# Patient Record
Sex: Female | Born: 1970 | Race: White | Hispanic: No | Marital: Married | Smoking: Never smoker
Health system: Southern US, Community
[De-identification: ages and names within clinical notes are randomized; demographics above are authoritative.]

## PROBLEM LIST (undated history)

## (undated) DIAGNOSIS — E079 Disorder of thyroid, unspecified: Secondary | ICD-10-CM

## (undated) DIAGNOSIS — E7800 Pure hypercholesterolemia, unspecified: Secondary | ICD-10-CM

## (undated) DIAGNOSIS — M797 Fibromyalgia: Secondary | ICD-10-CM

## (undated) DIAGNOSIS — E119 Type 2 diabetes mellitus without complications: Secondary | ICD-10-CM

## (undated) HISTORY — DX: Fibromyalgia: M79.7

## (undated) HISTORY — DX: Type 2 diabetes mellitus without complications: E11.9

## (undated) HISTORY — PX: OTHER SURGICAL HISTORY: SHX169

---

## 2000-05-20 ENCOUNTER — Emergency Department (HOSPITAL_COMMUNITY): Admission: EM | Admit: 2000-05-20 | Discharge: 2000-05-20 | Payer: Self-pay | Admitting: *Deleted

## 2006-10-19 ENCOUNTER — Emergency Department (HOSPITAL_COMMUNITY): Admission: EM | Admit: 2006-10-19 | Discharge: 2006-10-19 | Payer: Self-pay | Admitting: Emergency Medicine

## 2007-02-17 ENCOUNTER — Inpatient Hospital Stay (HOSPITAL_COMMUNITY): Admission: EM | Admit: 2007-02-17 | Discharge: 2007-02-19 | Payer: Self-pay | Admitting: Emergency Medicine

## 2007-11-10 ENCOUNTER — Inpatient Hospital Stay (HOSPITAL_COMMUNITY): Admission: EM | Admit: 2007-11-10 | Discharge: 2007-11-11 | Payer: Self-pay | Admitting: Emergency Medicine

## 2008-05-08 ENCOUNTER — Inpatient Hospital Stay (HOSPITAL_COMMUNITY): Admission: EM | Admit: 2008-05-08 | Discharge: 2008-05-12 | Payer: Self-pay | Admitting: Emergency Medicine

## 2008-06-02 ENCOUNTER — Inpatient Hospital Stay (HOSPITAL_COMMUNITY): Admission: EM | Admit: 2008-06-02 | Discharge: 2008-06-04 | Payer: Self-pay | Admitting: Emergency Medicine

## 2008-09-23 ENCOUNTER — Inpatient Hospital Stay (HOSPITAL_COMMUNITY): Admission: EM | Admit: 2008-09-23 | Discharge: 2008-09-25 | Payer: Self-pay | Admitting: Emergency Medicine

## 2008-10-16 ENCOUNTER — Inpatient Hospital Stay (HOSPITAL_COMMUNITY): Admission: EM | Admit: 2008-10-16 | Discharge: 2008-10-18 | Payer: Self-pay | Admitting: Internal Medicine

## 2008-10-16 ENCOUNTER — Encounter: Payer: Self-pay | Admitting: Emergency Medicine

## 2009-08-16 ENCOUNTER — Ambulatory Visit (HOSPITAL_COMMUNITY): Admission: RE | Admit: 2009-08-16 | Discharge: 2009-08-16 | Payer: Self-pay | Admitting: Preventative Medicine

## 2010-04-26 LAB — GLUCOSE, CAPILLARY
Glucose-Capillary: 119 mg/dL — ABNORMAL HIGH (ref 70–99)
Glucose-Capillary: 133 mg/dL — ABNORMAL HIGH (ref 70–99)
Glucose-Capillary: 146 mg/dL — ABNORMAL HIGH (ref 70–99)
Glucose-Capillary: 168 mg/dL — ABNORMAL HIGH (ref 70–99)
Glucose-Capillary: 214 mg/dL — ABNORMAL HIGH (ref 70–99)
Glucose-Capillary: 232 mg/dL — ABNORMAL HIGH (ref 70–99)
Glucose-Capillary: 234 mg/dL — ABNORMAL HIGH (ref 70–99)
Glucose-Capillary: 240 mg/dL — ABNORMAL HIGH (ref 70–99)
Glucose-Capillary: 241 mg/dL — ABNORMAL HIGH (ref 70–99)
Glucose-Capillary: 244 mg/dL — ABNORMAL HIGH (ref 70–99)
Glucose-Capillary: 248 mg/dL — ABNORMAL HIGH (ref 70–99)
Glucose-Capillary: 250 mg/dL — ABNORMAL HIGH (ref 70–99)
Glucose-Capillary: 283 mg/dL — ABNORMAL HIGH (ref 70–99)
Glucose-Capillary: 351 mg/dL — ABNORMAL HIGH (ref 70–99)
Glucose-Capillary: 389 mg/dL — ABNORMAL HIGH (ref 70–99)
Glucose-Capillary: 71 mg/dL (ref 70–99)
Glucose-Capillary: 76 mg/dL (ref 70–99)
Glucose-Capillary: 85 mg/dL (ref 70–99)
Glucose-Capillary: 380 mg/dL — ABNORMAL HIGH (ref 70–99)
Glucose-Capillary: 115 mg/dL — ABNORMAL HIGH (ref 70–99)
Glucose-Capillary: 119 mg/dL — ABNORMAL HIGH (ref 70–99)
Glucose-Capillary: 126 mg/dL — ABNORMAL HIGH (ref 70–99)
Glucose-Capillary: 131 mg/dL — ABNORMAL HIGH (ref 70–99)
Glucose-Capillary: 132 mg/dL — ABNORMAL HIGH (ref 70–99)
Glucose-Capillary: 133 mg/dL — ABNORMAL HIGH (ref 70–99)
Glucose-Capillary: 150 mg/dL — ABNORMAL HIGH (ref 70–99)
Glucose-Capillary: 173 mg/dL — ABNORMAL HIGH (ref 70–99)
Glucose-Capillary: 176 mg/dL — ABNORMAL HIGH (ref 70–99)
Glucose-Capillary: 180 mg/dL — ABNORMAL HIGH (ref 70–99)
Glucose-Capillary: 182 mg/dL — ABNORMAL HIGH (ref 70–99)
Glucose-Capillary: 193 mg/dL — ABNORMAL HIGH (ref 70–99)
Glucose-Capillary: 198 mg/dL — ABNORMAL HIGH (ref 70–99)
Glucose-Capillary: 223 mg/dL — ABNORMAL HIGH (ref 70–99)
Glucose-Capillary: 308 mg/dL — ABNORMAL HIGH (ref 70–99)
Glucose-Capillary: 439 mg/dL — ABNORMAL HIGH (ref 70–99)
Glucose-Capillary: 506 mg/dL (ref 70–99)
Glucose-Capillary: 55 mg/dL — ABNORMAL LOW (ref 70–99)
Glucose-Capillary: 77 mg/dL (ref 70–99)

## 2010-04-26 LAB — COMPREHENSIVE METABOLIC PANEL
AST: 20 U/L (ref 0–37)
Albumin: 3.1 g/dL — ABNORMAL LOW (ref 3.5–5.2)
Albumin: 2.8 g/dL — ABNORMAL LOW (ref 3.5–5.2)
Alkaline Phosphatase: 75 U/L (ref 39–117)
BUN: 15 mg/dL (ref 6–23)
BUN: 25 mg/dL — ABNORMAL HIGH (ref 6–23)
Calcium: 10.2 mg/dL (ref 8.4–10.5)
Calcium: 8.3 mg/dL — ABNORMAL LOW (ref 8.4–10.5)
Chloride: 115 mEq/L — ABNORMAL HIGH (ref 96–112)
Creatinine, Ser: 0.67 mg/dL (ref 0.4–1.2)
GFR calc Af Amer: >60 mL/min (ref >60)
Glucose, Bld: 512 mg/dL (ref 70–99)
Potassium: 4.9 mEq/L (ref 3.5–5.1)
Sodium: 138 mEq/L (ref 135–145)
Total Bilirubin: 0.9 mg/dL (ref 0.3–1.2)
Total Protein: 7.8 g/dL (ref 6.0–8.3)

## 2010-04-26 LAB — BASIC METABOLIC PANEL
BUN: 10 mg/dL (ref 6–23)
BUN: 11 mg/dL (ref 6–23)
BUN: 11 mg/dL (ref 6–23)
BUN: 3 mg/dL — ABNORMAL LOW (ref 6–23)
BUN: 8 mg/dL (ref 6–23)
BUN: 21 mg/dL (ref 6–23)
BUN: 20 mg/dL (ref 6–23)
BUN: 21 mg/dL (ref 6–23)
CO2: 15 mEq/L — ABNORMAL LOW (ref 19–32)
CO2: 24 mEq/L (ref 19–32)
CO2: 24 mEq/L (ref 19–32)
CO2: 18 mEq/L — ABNORMAL LOW (ref 19–32)
Calcium: 7.6 mg/dL — ABNORMAL LOW (ref 8.4–10.5)
Calcium: 7.8 mg/dL — ABNORMAL LOW (ref 8.4–10.5)
Calcium: 7.9 mg/dL — ABNORMAL LOW (ref 8.4–10.5)
Calcium: 8.1 mg/dL — ABNORMAL LOW (ref 8.4–10.5)
Calcium: 8.6 mg/dL (ref 8.4–10.5)
Calcium: 9.7 mg/dL (ref 8.4–10.5)
Calcium: 9.4 mg/dL (ref 8.4–10.5)
Chloride: 111 mEq/L (ref 96–112)
Chloride: 113 mEq/L — ABNORMAL HIGH (ref 96–112)
Chloride: 113 mEq/L — ABNORMAL HIGH (ref 96–112)
Chloride: 110 mEq/L (ref 96–112)
Chloride: 114 mEq/L — ABNORMAL HIGH (ref 96–112)
Chloride: 115 mEq/L — ABNORMAL HIGH (ref 96–112)
Creatinine, Ser: 0.74 mg/dL (ref 0.4–1.2)
Creatinine, Ser: 0.91 mg/dL (ref 0.4–1.2)
Creatinine, Ser: 0.92 mg/dL (ref 0.4–1.2)
Creatinine, Ser: 0.93 mg/dL (ref 0.4–1.2)
Creatinine, Ser: 1.4 mg/dL — ABNORMAL HIGH (ref 0.4–1.2)
Creatinine, Ser: 0.55 mg/dL (ref 0.4–1.2)
Creatinine, Ser: 1.2 mg/dL (ref 0.4–1.2)
GFR calc Af Amer: >60 mL/min (ref >60)
GFR calc Af Amer: >60 mL/min (ref >60)
GFR calc Af Amer: >60 mL/min (ref >60)
GFR calc Af Amer: 50 mL/min — ABNORMAL LOW (ref >60)
GFR calc Af Amer: >60 mL/min (ref >60)
GFR calc Af Amer: >60 mL/min (ref >60)
GFR calc Af Amer: >60 mL/min (ref >60)
GFR calc non Af Amer: >60 mL/min (ref >60)
GFR calc non Af Amer: >60 mL/min (ref >60)
GFR calc non Af Amer: >60 mL/min (ref >60)
GFR calc non Af Amer: >60 mL/min (ref >60)
GFR calc non Af Amer: 42 mL/min — ABNORMAL LOW (ref >60)
GFR calc non Af Amer: 50 mL/min — ABNORMAL LOW (ref >60)
GFR calc non Af Amer: >60 mL/min (ref >60)
GFR calc non Af Amer: >60 mL/min (ref >60)
Glucose, Bld: 144 mg/dL — ABNORMAL HIGH (ref 70–99)
Glucose, Bld: 150 mg/dL — ABNORMAL HIGH (ref 70–99)
Glucose, Bld: 160 mg/dL — ABNORMAL HIGH (ref 70–99)
Glucose, Bld: 252 mg/dL — ABNORMAL HIGH (ref 70–99)
Glucose, Bld: 76 mg/dL (ref 70–99)
Glucose, Bld: 165 mg/dL — ABNORMAL HIGH (ref 70–99)
Potassium: 3.4 mEq/L — ABNORMAL LOW (ref 3.5–5.1)
Potassium: 3.5 mEq/L (ref 3.5–5.1)
Potassium: 3.5 mEq/L (ref 3.5–5.1)
Potassium: 3.6 mEq/L (ref 3.5–5.1)
Potassium: 3.6 mEq/L (ref 3.5–5.1)
Potassium: 3.8 mEq/L (ref 3.5–5.1)
Potassium: 3.8 mEq/L (ref 3.5–5.1)
Potassium: 4.1 mEq/L (ref 3.5–5.1)
Sodium: 135 mEq/L (ref 135–145)
Sodium: 138 mEq/L (ref 135–145)
Sodium: 138 mEq/L (ref 135–145)
Sodium: 138 mEq/L (ref 135–145)
Sodium: 138 mEq/L (ref 135–145)
Sodium: 142 mEq/L (ref 135–145)

## 2010-04-26 LAB — BLOOD GAS, ARTERIAL
Bicarbonate: 10.5 mEq/L — ABNORMAL LOW (ref 20.0–24.0)
O2 Content: 21 L/min
O2 Saturation: 47.6 %
pCO2 arterial: 21.9 mmHg — ABNORMAL LOW (ref 35.0–45.0)
pH, Arterial: 7.109 — CL (ref 7.350–7.400)
pH, Arterial: 7.164 — CL (ref 7.350–7.400)
pO2, Arterial: 137 mmHg — ABNORMAL HIGH (ref 80.0–100.0)
pO2, Arterial: 32.5 mmHg — CL (ref 80.0–100.0)

## 2010-04-26 LAB — RAPID URINE DRUG SCREEN, HOSP PERFORMED
Benzodiazepines: NOT DETECTED
Cocaine: NOT DETECTED
Opiates: NOT DETECTED

## 2010-04-26 LAB — CBC
HCT: 38.1 % (ref 36.0–46.0)
HCT: 34.4 % — ABNORMAL LOW (ref 36.0–46.0)
HCT: 47 % — ABNORMAL HIGH (ref 36.0–46.0)
Hemoglobin: 12.6 g/dL (ref 12.0–15.0)
Hemoglobin: 15.4 g/dL — ABNORMAL HIGH (ref 12.0–15.0)
MCHC: 33.5 g/dL (ref 30.0–36.0)
MCHC: 32.9 g/dL (ref 30.0–36.0)
MCHC: 34.5 g/dL (ref 30.0–36.0)
MCV: 88.3 fL (ref 78.0–100.0)
MCV: 84.8 fL (ref 78.0–100.0)
MCV: 85.1 fL (ref 78.0–100.0)
Platelets: 222 10*3/uL (ref 150–400)
Platelets: 256 10*3/uL (ref 150–400)
Platelets: 313 10*3/uL (ref 150–400)
Platelets: 208 10*3/uL (ref 150–400)
Platelets: 258 10*3/uL (ref 150–400)
RBC: 4.36 MIL/uL (ref 3.87–5.11)
RBC: 5.17 MIL/uL — ABNORMAL HIGH (ref 3.87–5.11)
RBC: 4.04 MIL/uL (ref 3.87–5.11)
RDW: 15.1 % (ref 11.5–15.5)
RDW: 13.5 % (ref 11.5–15.5)
WBC: 14.4 10*3/uL — ABNORMAL HIGH (ref 4.0–10.5)
WBC: 13.8 10*3/uL — ABNORMAL HIGH (ref 4.0–10.5)
WBC: 9.9 10*3/uL (ref 4.0–10.5)

## 2010-04-26 LAB — DIFFERENTIAL
Basophils Absolute: 0.1 10*3/uL (ref 0.0–0.1)
Basophils Relative: 1 % (ref 0–1)
Eosinophils Absolute: 0 10*3/uL (ref 0.0–0.7)
Eosinophils Absolute: 0 10*3/uL (ref 0.0–0.7)
Eosinophils Relative: 0 % (ref 0–5)
Eosinophils Relative: 0 % (ref 0–5)
Eosinophils Relative: 0 % (ref 0–5)
Lymphocytes Relative: 7 % — ABNORMAL LOW (ref 12–46)
Lymphocytes Relative: 18 % (ref 12–46)
Lymphocytes Relative: 5 % — ABNORMAL LOW (ref 12–46)
Lymphocytes Relative: 8 % — ABNORMAL LOW (ref 12–46)
Lymphs Abs: 0.9 10*3/uL (ref 0.7–4.0)
Lymphs Abs: 1 10*3/uL (ref 0.7–4.0)
Lymphs Abs: 1.7 10*3/uL (ref 0.7–4.0)
Lymphs Abs: 1.8 10*3/uL (ref 0.7–4.0)
Monocytes Absolute: 0.7 10*3/uL (ref 0.1–1.0)
Monocytes Absolute: 1.2 10*3/uL — ABNORMAL HIGH (ref 0.1–1.0)
Monocytes Absolute: 1.3 10*3/uL — ABNORMAL HIGH (ref 0.1–1.0)
Monocytes Relative: 6 % (ref 3–12)
Monocytes Relative: 6 % (ref 3–12)
Neutro Abs: 12.6 10*3/uL — ABNORMAL HIGH (ref 1.7–7.7)
Neutro Abs: 12.2 10*3/uL — ABNORMAL HIGH (ref 1.7–7.7)
Neutro Abs: 18.6 10*3/uL — ABNORMAL HIGH (ref 1.7–7.7)
Neutrophils Relative %: 88 % — ABNORMAL HIGH (ref 43–77)
Neutrophils Relative %: 75 % (ref 43–77)

## 2010-04-26 LAB — TSH: TSH: 1.215 u[IU]/mL (ref 0.350–4.500)

## 2010-04-26 LAB — CULTURE, BLOOD (ROUTINE X 2)

## 2010-04-26 LAB — URINALYSIS, ROUTINE W REFLEX MICROSCOPIC
Bilirubin Urine: NEGATIVE
Glucose, UA: >1000 mg/dL — AB
Hgb urine dipstick: NEGATIVE
Ketones, ur: 40 mg/dL — AB
Ketones, ur: >80 mg/dL — AB
Leukocytes, UA: NEGATIVE
Nitrite: NEGATIVE
Nitrite: NEGATIVE
Protein, ur: NEGATIVE mg/dL
Protein, ur: NEGATIVE mg/dL
Specific Gravity, Urine: 1.025 (ref 1.005–1.030)
Specific Gravity, Urine: 1.025 (ref 1.005–1.030)
Urobilinogen, UA: 0.2 mg/dL (ref 0.0–1.0)
Urobilinogen, UA: 0.2 mg/dL (ref 0.0–1.0)
pH: 5.5 (ref 5.0–8.0)

## 2010-04-26 LAB — URINE MICROSCOPIC-ADD ON

## 2010-04-26 LAB — PHOSPHORUS: Phosphorus: 2.7 mg/dL (ref 2.3–4.6)

## 2010-04-26 LAB — MAGNESIUM
Magnesium: 2.1 mg/dL (ref 1.5–2.5)
Magnesium: 2.2 mg/dL (ref 1.5–2.5)

## 2010-04-26 LAB — URINE CULTURE
Colony Count: 35000
Special Requests: NEGATIVE

## 2010-04-26 LAB — PREGNANCY, URINE: Preg Test, Ur: NEGATIVE

## 2010-04-26 LAB — HEMOGLOBIN A1C: Hgb A1c MFr Bld: 11 % — ABNORMAL HIGH (ref 4.6–6.1)

## 2010-04-26 LAB — KETONES, QUALITATIVE: Acetone, Bld: 2 — AB

## 2010-04-30 LAB — BASIC METABOLIC PANEL
BUN: 1 mg/dL — ABNORMAL LOW (ref 6–23)
BUN: 1 mg/dL — ABNORMAL LOW (ref 6–23)
BUN: 11 mg/dL (ref 6–23)
BUN: 16 mg/dL (ref 6–23)
BUN: 2 mg/dL — ABNORMAL LOW (ref 6–23)
BUN: 3 mg/dL — ABNORMAL LOW (ref 6–23)
BUN: 5 mg/dL — ABNORMAL LOW (ref 6–23)
CO2: 21 mEq/L (ref 19–32)
CO2: 12 mEq/L — ABNORMAL LOW (ref 19–32)
CO2: 6 mEq/L — CL (ref 19–32)
Calcium: 6.7 mg/dL — ABNORMAL LOW (ref 8.4–10.5)
Calcium: 6.8 mg/dL — ABNORMAL LOW (ref 8.4–10.5)
Calcium: 7.5 mg/dL — ABNORMAL LOW (ref 8.4–10.5)
Chloride: 118 mEq/L — ABNORMAL HIGH (ref 96–112)
Chloride: 109 mEq/L (ref 96–112)
Chloride: 118 mEq/L — ABNORMAL HIGH (ref 96–112)
Chloride: 119 mEq/L — ABNORMAL HIGH (ref 96–112)
Chloride: 119 mEq/L — ABNORMAL HIGH (ref 96–112)
Chloride: 119 mEq/L — ABNORMAL HIGH (ref 96–112)
Creatinine, Ser: 0.5 mg/dL (ref 0.4–1.2)
Creatinine, Ser: 0.43 mg/dL (ref 0.4–1.2)
Creatinine, Ser: 0.49 mg/dL (ref 0.4–1.2)
Creatinine, Ser: 0.57 mg/dL (ref 0.4–1.2)
Creatinine, Ser: 1.01 mg/dL (ref 0.4–1.2)
Creatinine, Ser: 1.28 mg/dL — ABNORMAL HIGH (ref 0.4–1.2)
GFR calc Af Amer: >60 mL/min (ref >60)
GFR calc Af Amer: >60 mL/min (ref >60)
GFR calc Af Amer: >60 mL/min (ref >60)
GFR calc Af Amer: >60 mL/min (ref >60)
GFR calc non Af Amer: 46 mL/min — ABNORMAL LOW (ref >60)
GFR calc non Af Amer: >60 mL/min (ref >60)
GFR calc non Af Amer: >60 mL/min (ref >60)
GFR calc non Af Amer: >60 mL/min (ref >60)
GFR calc non Af Amer: >60 mL/min (ref >60)
GFR calc non Af Amer: >60 mL/min (ref >60)
GFR calc non Af Amer: >60 mL/min (ref >60)
Glucose, Bld: 95 mg/dL (ref 70–99)
Glucose, Bld: 114 mg/dL — ABNORMAL HIGH (ref 70–99)
Glucose, Bld: 114 mg/dL — ABNORMAL HIGH (ref 70–99)
Glucose, Bld: 219 mg/dL — ABNORMAL HIGH (ref 70–99)
Glucose, Bld: 250 mg/dL — ABNORMAL HIGH (ref 70–99)
Glucose, Bld: 442 mg/dL — ABNORMAL HIGH (ref 70–99)
Potassium: 3.7 mEq/L (ref 3.5–5.1)
Potassium: 3.1 mEq/L — ABNORMAL LOW (ref 3.5–5.1)
Potassium: 3.3 mEq/L — ABNORMAL LOW (ref 3.5–5.1)
Potassium: 3.6 mEq/L (ref 3.5–5.1)
Potassium: 3.8 mEq/L (ref 3.5–5.1)
Potassium: 4.7 mEq/L (ref 3.5–5.1)
Potassium: 4.9 mEq/L (ref 3.5–5.1)
Sodium: 136 mEq/L (ref 135–145)
Sodium: 137 mEq/L (ref 135–145)
Sodium: 137 mEq/L (ref 135–145)
Sodium: 138 mEq/L (ref 135–145)
Sodium: 141 mEq/L (ref 135–145)

## 2010-04-30 LAB — DIFFERENTIAL
Basophils Absolute: 0.1 10*3/uL (ref 0.0–0.1)
Basophils Relative: 1 % (ref 0–1)
Eosinophils Absolute: 0.1 10*3/uL (ref 0.0–0.7)
Eosinophils Absolute: 0 10*3/uL (ref 0.0–0.7)
Eosinophils Relative: 2 % (ref 0–5)
Eosinophils Relative: 0 % (ref 0–5)
Eosinophils Relative: 0 % (ref 0–5)
Lymphocytes Relative: 21 % (ref 12–46)
Lymphs Abs: 1.6 10*3/uL (ref 0.7–4.0)
Lymphs Abs: 2.4 10*3/uL (ref 0.7–4.0)
Monocytes Absolute: 1 10*3/uL (ref 0.1–1.0)
Neutrophils Relative %: 56 % (ref 43–77)
Neutrophils Relative %: 87 % — ABNORMAL HIGH (ref 43–77)

## 2010-04-30 LAB — GLUCOSE, CAPILLARY
Glucose-Capillary: 103 mg/dL — ABNORMAL HIGH (ref 70–99)
Glucose-Capillary: 117 mg/dL — ABNORMAL HIGH (ref 70–99)
Glucose-Capillary: 117 mg/dL — ABNORMAL HIGH (ref 70–99)
Glucose-Capillary: 47 mg/dL — ABNORMAL LOW (ref 70–99)
Glucose-Capillary: 50 mg/dL — ABNORMAL LOW (ref 70–99)
Glucose-Capillary: 103 mg/dL — ABNORMAL HIGH (ref 70–99)
Glucose-Capillary: 109 mg/dL — ABNORMAL HIGH (ref 70–99)
Glucose-Capillary: 111 mg/dL — ABNORMAL HIGH (ref 70–99)
Glucose-Capillary: 123 mg/dL — ABNORMAL HIGH (ref 70–99)
Glucose-Capillary: 126 mg/dL — ABNORMAL HIGH (ref 70–99)
Glucose-Capillary: 134 mg/dL — ABNORMAL HIGH (ref 70–99)
Glucose-Capillary: 137 mg/dL — ABNORMAL HIGH (ref 70–99)
Glucose-Capillary: 144 mg/dL — ABNORMAL HIGH (ref 70–99)
Glucose-Capillary: 154 mg/dL — ABNORMAL HIGH (ref 70–99)
Glucose-Capillary: 183 mg/dL — ABNORMAL HIGH (ref 70–99)
Glucose-Capillary: 186 mg/dL — ABNORMAL HIGH (ref 70–99)
Glucose-Capillary: 212 mg/dL — ABNORMAL HIGH (ref 70–99)
Glucose-Capillary: 218 mg/dL — ABNORMAL HIGH (ref 70–99)
Glucose-Capillary: 256 mg/dL — ABNORMAL HIGH (ref 70–99)
Glucose-Capillary: 312 mg/dL — ABNORMAL HIGH (ref 70–99)
Glucose-Capillary: 78 mg/dL (ref 70–99)

## 2010-04-30 LAB — BLOOD GAS, ARTERIAL
TCO2: 2.8 mmol/L (ref 0–100)
pCO2 arterial: 11 mmHg — CL (ref 35.0–45.0)
pH, Arterial: 7.038 — CL (ref 7.350–7.400)
pO2, Arterial: 137 mmHg — ABNORMAL HIGH (ref 80.0–100.0)

## 2010-04-30 LAB — CBC
HCT: 31.8 % — ABNORMAL LOW (ref 36.0–46.0)
HCT: 33.5 % — ABNORMAL LOW (ref 36.0–46.0)
HCT: 45.2 % (ref 36.0–46.0)
Hemoglobin: 11.8 g/dL — ABNORMAL LOW (ref 12.0–15.0)
MCHC: 34.9 g/dL (ref 30.0–36.0)
MCV: 87.1 fL (ref 78.0–100.0)
MCV: 89.5 fL (ref 78.0–100.0)
Platelets: 222 10*3/uL (ref 150–400)
Platelets: 366 10*3/uL (ref 150–400)
RDW: 14.8 % (ref 11.5–15.5)
RDW: 14.6 % (ref 11.5–15.5)
WBC: 4.8 10*3/uL (ref 4.0–10.5)
WBC: 11.6 10*3/uL — ABNORMAL HIGH (ref 4.0–10.5)

## 2010-04-30 LAB — PHOSPHORUS
Phosphorus: 1.7 mg/dL — ABNORMAL LOW (ref 2.3–4.6)
Phosphorus: 0.9 mg/dL — CL (ref 2.3–4.6)
Phosphorus: 1.2 mg/dL — ABNORMAL LOW (ref 2.3–4.6)
Phosphorus: 1.3 mg/dL — ABNORMAL LOW (ref 2.3–4.6)
Phosphorus: 1.4 mg/dL — ABNORMAL LOW (ref 2.3–4.6)
Phosphorus: <1 mg/dL — CL (ref 2.3–4.6)
Phosphorus: <1 mg/dL — CL (ref 2.3–4.6)

## 2010-04-30 LAB — URINALYSIS, ROUTINE W REFLEX MICROSCOPIC
Ketones, ur: >80 mg/dL — AB
Leukocytes, UA: NEGATIVE
Nitrite: NEGATIVE
Protein, ur: 100 mg/dL — AB
Urobilinogen, UA: 0.2 mg/dL (ref 0.0–1.0)

## 2010-04-30 LAB — HEPATIC FUNCTION PANEL
ALT: 13 U/L (ref 0–35)
AST: 23 U/L (ref 0–37)
Albumin: 3.6 g/dL (ref 3.5–5.2)
Alkaline Phosphatase: 91 U/L (ref 39–117)
Total Protein: 6.9 g/dL (ref 6.0–8.3)

## 2010-04-30 LAB — HEMOGLOBIN A1C: Hgb A1c MFr Bld: 10.8 % — ABNORMAL HIGH (ref 4.6–6.1)

## 2010-04-30 LAB — URINE MICROSCOPIC-ADD ON

## 2010-05-01 LAB — GLUCOSE, CAPILLARY
Glucose-Capillary: 103 mg/dL — ABNORMAL HIGH (ref 70–99)
Glucose-Capillary: 120 mg/dL — ABNORMAL HIGH (ref 70–99)
Glucose-Capillary: 121 mg/dL — ABNORMAL HIGH (ref 70–99)
Glucose-Capillary: 126 mg/dL — ABNORMAL HIGH (ref 70–99)
Glucose-Capillary: 128 mg/dL — ABNORMAL HIGH (ref 70–99)
Glucose-Capillary: 129 mg/dL — ABNORMAL HIGH (ref 70–99)
Glucose-Capillary: 134 mg/dL — ABNORMAL HIGH (ref 70–99)
Glucose-Capillary: 143 mg/dL — ABNORMAL HIGH (ref 70–99)
Glucose-Capillary: 147 mg/dL — ABNORMAL HIGH (ref 70–99)
Glucose-Capillary: 152 mg/dL — ABNORMAL HIGH (ref 70–99)
Glucose-Capillary: 166 mg/dL — ABNORMAL HIGH (ref 70–99)
Glucose-Capillary: 177 mg/dL — ABNORMAL HIGH (ref 70–99)
Glucose-Capillary: 180 mg/dL — ABNORMAL HIGH (ref 70–99)
Glucose-Capillary: 182 mg/dL — ABNORMAL HIGH (ref 70–99)
Glucose-Capillary: 187 mg/dL — ABNORMAL HIGH (ref 70–99)
Glucose-Capillary: 192 mg/dL — ABNORMAL HIGH (ref 70–99)
Glucose-Capillary: 192 mg/dL — ABNORMAL HIGH (ref 70–99)
Glucose-Capillary: 203 mg/dL — ABNORMAL HIGH (ref 70–99)
Glucose-Capillary: 210 mg/dL — ABNORMAL HIGH (ref 70–99)
Glucose-Capillary: 217 mg/dL — ABNORMAL HIGH (ref 70–99)
Glucose-Capillary: 227 mg/dL — ABNORMAL HIGH (ref 70–99)
Glucose-Capillary: 241 mg/dL — ABNORMAL HIGH (ref 70–99)
Glucose-Capillary: 278 mg/dL — ABNORMAL HIGH (ref 70–99)
Glucose-Capillary: 330 mg/dL — ABNORMAL HIGH (ref 70–99)
Glucose-Capillary: 99 mg/dL (ref 70–99)

## 2010-05-01 LAB — BASIC METABOLIC PANEL
BUN: 12 mg/dL (ref 6–23)
BUN: 5 mg/dL — ABNORMAL LOW (ref 6–23)
BUN: 7 mg/dL (ref 6–23)
BUN: 9 mg/dL (ref 6–23)
CO2: 11 mEq/L — ABNORMAL LOW (ref 19–32)
CO2: 19 mEq/L (ref 19–32)
CO2: 32 mEq/L (ref 19–32)
CO2: 8 mEq/L — CL (ref 19–32)
Calcium: 6.2 mg/dL — CL (ref 8.4–10.5)
Calcium: 6.4 mg/dL — CL (ref 8.4–10.5)
Calcium: 6.4 mg/dL — CL (ref 8.4–10.5)
Calcium: 6.6 mg/dL — ABNORMAL LOW (ref 8.4–10.5)
Calcium: 8.5 mg/dL (ref 8.4–10.5)
Chloride: 108 mEq/L (ref 96–112)
Chloride: 111 mEq/L (ref 96–112)
Chloride: 117 mEq/L — ABNORMAL HIGH (ref 96–112)
Chloride: 120 mEq/L — ABNORMAL HIGH (ref 96–112)
Creatinine, Ser: 0.44 mg/dL (ref 0.4–1.2)
Creatinine, Ser: 0.47 mg/dL (ref 0.4–1.2)
Creatinine, Ser: 0.51 mg/dL (ref 0.4–1.2)
Creatinine, Ser: 0.52 mg/dL (ref 0.4–1.2)
Creatinine, Ser: 0.72 mg/dL (ref 0.4–1.2)
Creatinine, Ser: 0.75 mg/dL (ref 0.4–1.2)
Creatinine, Ser: 1.3 mg/dL — ABNORMAL HIGH (ref 0.4–1.2)
GFR calc Af Amer: >60 mL/min (ref >60)
GFR calc Af Amer: >60 mL/min (ref >60)
GFR calc Af Amer: >60 mL/min (ref >60)
GFR calc Af Amer: >60 mL/min (ref >60)
GFR calc Af Amer: >60 mL/min (ref >60)
GFR calc non Af Amer: >60 mL/min (ref >60)
GFR calc non Af Amer: >60 mL/min (ref >60)
Glucose, Bld: 142 mg/dL — ABNORMAL HIGH (ref 70–99)
Glucose, Bld: 158 mg/dL — ABNORMAL HIGH (ref 70–99)
Glucose, Bld: 185 mg/dL — ABNORMAL HIGH (ref 70–99)
Glucose, Bld: 205 mg/dL — ABNORMAL HIGH (ref 70–99)
Glucose, Bld: 405 mg/dL — ABNORMAL HIGH (ref 70–99)
Potassium: 3.4 mEq/L — ABNORMAL LOW (ref 3.5–5.1)
Potassium: 4.1 mEq/L (ref 3.5–5.1)
Potassium: 4.7 mEq/L (ref 3.5–5.1)
Sodium: 143 mEq/L (ref 135–145)

## 2010-05-01 LAB — URINALYSIS, ROUTINE W REFLEX MICROSCOPIC
Bilirubin Urine: NEGATIVE
Glucose, UA: 250 mg/dL — AB
Ketones, ur: >80 mg/dL — AB
Leukocytes, UA: NEGATIVE
Nitrite: NEGATIVE
Specific Gravity, Urine: >1.03 — ABNORMAL HIGH (ref 1.005–1.030)
Urobilinogen, UA: 0.2 mg/dL (ref 0.0–1.0)

## 2010-05-01 LAB — CK TOTAL AND CKMB (NOT AT ARMC): Total CK: 104 U/L (ref 7–177)

## 2010-05-01 LAB — DIFFERENTIAL
Basophils Absolute: 0 10*3/uL (ref 0.0–0.1)
Basophils Absolute: 0 10*3/uL (ref 0.0–0.1)
Basophils Relative: 0 % (ref 0–1)
Basophils Relative: 0 % (ref 0–1)
Basophils Relative: 0 % (ref 0–1)
Basophils Relative: 1 % (ref 0–1)
Eosinophils Absolute: 0 10*3/uL (ref 0.0–0.7)
Eosinophils Absolute: 0.1 10*3/uL (ref 0.0–0.7)
Eosinophils Absolute: 0.1 10*3/uL (ref 0.0–0.7)
Eosinophils Relative: 0 % (ref 0–5)
Eosinophils Relative: 0 % (ref 0–5)
Eosinophils Relative: 1 % (ref 0–5)
Lymphocytes Relative: 5 % — ABNORMAL LOW (ref 12–46)
Lymphs Abs: 1.8 10*3/uL (ref 0.7–4.0)
Monocytes Absolute: 0.6 10*3/uL (ref 0.1–1.0)
Monocytes Absolute: 0.9 10*3/uL (ref 0.1–1.0)
Monocytes Relative: 5 % (ref 3–12)
Monocytes Relative: 8 % (ref 3–12)
Monocytes Relative: 9 % (ref 3–12)
Monocytes Relative: 9 % (ref 3–12)
Neutro Abs: 1.5 10*3/uL — ABNORMAL LOW (ref 1.7–7.7)
Neutro Abs: 17.1 10*3/uL — ABNORMAL HIGH (ref 1.7–7.7)
Neutro Abs: 2 10*3/uL (ref 1.7–7.7)
Neutrophils Relative %: 40 % — ABNORMAL LOW (ref 43–77)
Neutrophils Relative %: 46 % (ref 43–77)
Neutrophils Relative %: 71 % (ref 43–77)
Neutrophils Relative %: 89 % — ABNORMAL HIGH (ref 43–77)

## 2010-05-01 LAB — HEPATIC FUNCTION PANEL
AST: 15 U/L (ref 0–37)
Albumin: 2.6 g/dL — ABNORMAL LOW (ref 3.5–5.2)
Alkaline Phosphatase: 56 U/L (ref 39–117)
Total Protein: 4.6 g/dL — ABNORMAL LOW (ref 6.0–8.3)

## 2010-05-01 LAB — BLOOD GAS, ARTERIAL
Acid-base deficit: 20 mmol/L — ABNORMAL HIGH (ref 0.0–2.0)
Acid-base deficit: 27.7 mmol/L — ABNORMAL HIGH (ref 0.0–2.0)
Bicarbonate: 1.9 mEq/L — ABNORMAL LOW (ref 20.0–24.0)
Bicarbonate: 17.8 mEq/L — ABNORMAL LOW (ref 20.0–24.0)
FIO2: 21 %
O2 Content: 2 L/min
O2 Content: 2 L/min
O2 Saturation: 98.2 %
O2 Saturation: 99.2 %
TCO2: 16.2 mmol/L (ref 0–100)
TCO2: 5.4 mmol/L (ref 0–100)
pCO2 arterial: 12.3 mmHg — CL (ref 35.0–45.0)
pCO2 arterial: 35.8 mmHg (ref 35.0–45.0)
pCO2 arterial: 7.7 mmHg — CL (ref 35.0–45.0)
pH, Arterial: 7.424 — ABNORMAL HIGH (ref 7.350–7.400)
pH, Arterial: 7.445 — ABNORMAL HIGH (ref 7.350–7.400)
pO2, Arterial: 148 mmHg — ABNORMAL HIGH (ref 80.0–100.0)
pO2, Arterial: 150 mmHg — ABNORMAL HIGH (ref 80.0–100.0)

## 2010-05-01 LAB — CBC
HCT: 33.6 % — ABNORMAL LOW (ref 36.0–46.0)
HCT: 39.3 % (ref 36.0–46.0)
HCT: 39.5 % (ref 36.0–46.0)
HCT: 46.1 % — ABNORMAL HIGH (ref 36.0–46.0)
Hemoglobin: 11.5 g/dL — ABNORMAL LOW (ref 12.0–15.0)
Hemoglobin: 13.4 g/dL (ref 12.0–15.0)
MCHC: 34 g/dL (ref 30.0–36.0)
MCHC: 34.3 g/dL (ref 30.0–36.0)
MCHC: 34.7 g/dL (ref 30.0–36.0)
MCV: 86.3 fL (ref 78.0–100.0)
MCV: 87.2 fL (ref 78.0–100.0)
MCV: 88.8 fL (ref 78.0–100.0)
MCV: 89 fL (ref 78.0–100.0)
Platelets: 178 10*3/uL (ref 150–400)
Platelets: 307 10*3/uL (ref 150–400)
Platelets: 357 10*3/uL (ref 150–400)
RBC: 3.85 MIL/uL — ABNORMAL LOW (ref 3.87–5.11)
RBC: 4.17 MIL/uL (ref 3.87–5.11)
RBC: 4.32 MIL/uL (ref 3.87–5.11)
RBC: 4.43 MIL/uL (ref 3.87–5.11)
RDW: 14.2 % (ref 11.5–15.5)
RDW: 14.3 % (ref 11.5–15.5)
RDW: 14.6 % (ref 11.5–15.5)
WBC: 13.1 10*3/uL — ABNORMAL HIGH (ref 4.0–10.5)
WBC: 3.7 10*3/uL — ABNORMAL LOW (ref 4.0–10.5)
WBC: 4.3 10*3/uL (ref 4.0–10.5)

## 2010-05-01 LAB — URINE MICROSCOPIC-ADD ON

## 2010-05-01 LAB — CULTURE, BLOOD (ROUTINE X 2)
Culture: NO GROWTH
Culture: NO GROWTH
Report Status: 4252010

## 2010-05-01 LAB — TSH: TSH: 1.391 u[IU]/mL (ref 0.350–4.500)

## 2010-05-01 LAB — MAGNESIUM: Magnesium: 1.9 mg/dL (ref 1.5–2.5)

## 2010-05-01 LAB — KETONES, QUALITATIVE

## 2010-05-01 LAB — PHOSPHORUS: Phosphorus: 0.8 mg/dL — CL (ref 2.3–4.6)

## 2010-05-01 LAB — PROTIME-INR: INR: 1.2 (ref 0.00–1.49)

## 2010-06-04 NOTE — Discharge Summary (Signed)
[name], [name]             ACCOUNT NO.:  [id number]   MEDICAL RECORD NO.:  [id number]          PATIENT TYPE:  INP   LOCATION:  A304                          FACILITY:  [facility]   PHYSICIAN:  Skeet Latch, DO    DATE OF BIRTH:  1970/07/16   DATE OF ADMISSION:  05/08/2008  DATE OF DISCHARGE:  04/23/2010LH                               DISCHARGE SUMMARY   DISCHARGE DIAGNOSES:  1. Diabetic ketoacidosis.  2. History of insulin dependent diabetes.  3. History of hypothyroid.   BRIEF HOSPITAL COURSE:  This is a 40 year old Caucasian female who  presented to [facility] ER with the chief complaint of nausea and  vomiting.  Upon evaluations she was found to have a blood sugar of 400.  The patient has a history of juvenile diabetes and is on insulin pump.  Initial workup showed chest x-ray within normal limits.  Her ABG showed  a pH of 7.023, pCO2 7.7, pO2 148, bicarb 1.9, her acid base deficit was  27.7.  She was satting 96%.  Sodium was 137, potassium 4.7, chloride  108, CO2 5, glucose 405, BUN 12, creatinine 1.3, white count 13,000,  platelet count of 357,000.  The patient was admitted with diabetic  ketoacidosis to the ICU.  She was placed on DKA protocol and started on  glucommander.  The patient was given bicarb as well as potassium.  The  patient had daily ABGs done.  Ultimately the patient's glucommander was  stopped.  The patient was placed on a sliding scale.  Diabetic team was  consulted.  Her insulin pump was restarted.  The patient's blood sugars  are within normal range at this time.  At this time we feel the patient  can be discharged to home with followup with her endocrinologist.  Current vital signs:  Temperature is 97.7, pulse 90, respirations 16,  blood pressure 110/77, she is satting 98% on room air.   LABS:  White count is 3.7, hemoglobin 13, hematocrit 37.4, platelet  count 178,000, sodium 145, potassium 3.7, chloride 110, CO2 32, glucose  126, BUN 2, creatinine  0.52.   MEDICATIONS ON DISCHARGE:  The patient is to resume her home dose of her  Synthroid once a day.  The patient is to maintain her current settings  for her insulin pump.   CONDITION ON DISCHARGE:  Stable.   DISPOSITION:  The patient will be discharged to home.   DISCHARGE INSTRUCTIONS:  The patient is to maintain her diabetic diet.  She is to increase her activity slowly.  She is to follow up with Dr.  Merilynn [name], her endocrinologist, next week.  The patient is take all  medications as prescribed.  I  spoke with the patient at length.  I think that her DKA episodes seemed  to be stress induced.  This needs to be discussed with Dr. Merilynn [name] or her  primary care physician at this time.  The patient is to return to the  emergency room if she has any more abdominal pain, nausea, vomiting or  extremely elevated blood sugars.      Skeet Latch, DO  Electronically Signed     SM/MEDQ  D:  05/12/2008  T:  05/12/2008  Job:  [id number]

## 2010-06-04 NOTE — H&P (Signed)
NAMEHORACE, WISHON             ACCOUNT NO.:  [id number]   MEDICAL RECORD NO.:  [id number]          PATIENT TYPE:  INP   LOCATION:  A210                          FACILITY:  [facility]   PHYSICIAN:  Osvaldo Shipper, MD     DATE OF BIRTH:  June 28, 1970   DATE OF ADMISSION:  02/17/2007  DATE OF DISCHARGE:  LH                              HISTORY & PHYSICAL   The patient's primary care physician is located in [place].   ADMITTING DIAGNOSIS:  1. Diabetic ketoacidosis.  2. Possible viral gastroenteritis.  3. Hypothyroidism.   CHIEF COMPLAINT:  Nausea and vomiting since last night.   HISTORY OF PRESENT ILLNESS:  Patient is a 40 year old Caucasian female,  who has a past medical history of diabetes, on insulin pump, and  hypothyroidism, who was in her usual state of health until yesterday  evening, when she started having vomiting.  This was followed by  diarrhea.  She had numerous episodes of both.  Denied any fever, but did  have some chills.  She mentioned that her colleagues at her work place  have been having similar symptoms.  She denied any other sick contacts.  She denied any cough, any urinary disturbances.  She mentioned some  upper abdominal discomfort as a result of the vomiting, but not prior to  that.   MEDICATIONS AT HOME:  She is on a Humalog insulin pump, but she does not  remember the settings.  She is also on Synthroid 88 mcg once daily.   ALLERGIES:  No known drug allergies.   PAST MEDICAL HISTORY:  Diabetic since 1991.  Since then she has been on  insulin.  She has hypothyroidism.  Otherwise, she has had a C-section in  the past and cataract surgeries.   SOCIAL HISTORY:  Lives in [place].  Lives by herself most of the  time.  No smoking use, occasional alcohol use, no illicit drug use.  She  is currently employed.   FAMILY HISTORY:  Positive for coronary artery disease in her father.   REVIEW OF SYSTEMS:  GENERAL REVIEW OF SYSTEMS:  Positive for weakness,  malaise.  HEENT:  Unremarkable.  CARDIOVASCULAR:  Unremarkable.  RESPIRATORY:  Unremarkable.  GI:  As in HPI.  GU:  Unremarkable.  MUSCULOSKELETAL:  Unremarkable.  NEUROLOGIC:  Unremarkable.  PSYCHIATRIC:  Unremarkable.  OTHER SYSTEMS:  Unremarkable.   PHYSICAL EXAMINATION:  VITAL SIGNS:  When she was in the ED, her  temperature was 98.6, blood pressure 121/70, heart rate 126, respiratory  rate 18, saturation 100% on room air.  GENERAL EXAM:  This is a thin, white female, who appears to be a little  bit weak, but in no distress.  HEENT:  There is no pallor, no icterus.  Oral mucosa is dry.  No oral  lesions are noted.  NECK:  Soft and supple.  No thyromegaly is appreciated.  LUNGS:  Clear to auscultation bilaterally.  CARDIOVASCULAR:  S1, S2 are slightly tachycardic, regular, no murmurs  appreciated.  ABDOMEN:  Soft.  There is tenderness in the epigastric area, but no  rebound, density or guarding.  Bowel sounds  are present, normal.  No  mass or organomegaly is appreciated.  EXTREMITIES:  Show no edema.  Peripheral pulses are palpable.  NEUROLOGICALLY:  Patient is alert and oriented times three.  No focal  neurological deficits are present.   LABORATORY DATA:  Her white count is 24,900 with 90% neutrophils,  hemoglobin is 15.6 with MCV of 89, platelet count of 367.  CMET shows  bicarb of 7, glucose of 451, BUN is 19, creatinine is 1.38.  Her anion  gap is 26.  Her blood acetone was moderate.  Urinalysis did not show any  evidence for infection, did show mild proteinuria, large blood, but she  is having fevers at this time.   ASSESSMENT:  This is a 40 year old Caucasian female with insulin  dependent diabetes, who presents with nausea, vomiting, diarrhea and is  found to be dehydrated and has evidence for diabetic ketoacidosis.   PLAN:  1. Diabetic ketoacidosis:  Protocol for diabetic ketoacidosis will be      followed.  She will be given IV fluids, insulin drip, BMETs will be       checked.  I do not see, at this point, a need to check an ABG.  She      is clearly acidotic.  Possible contributing factor was      gastroenteritis.  2. Gastroenteritis:  Symptomatic treatment for now.  If she does not      improve, we will consider imaging studies.  3. Leukocytosis:  Likely as a result of dehydration, though infection      is also possible.  Hence, we will empirically start her on Levaquin      and we will follow her white count.  4. Mild epigastric discomfort:  We will check amylase, lipase and then      proceed from there.  Proton pump inhibitor will be used, as well.   Insulin pump has been discontinued, as she is on IV insulin.   I anticipate the patient's staying here about two to three days.      Osvaldo Shipper, MD  Electronically Signed     GK/MEDQ  D:  02/17/2007  T:  02/17/2007  Job:  [id number]

## 2010-06-04 NOTE — H&P (Signed)
[name], [name]             ACCOUNT NO.:  [id number]   MEDICAL RECORD NO.:  [id number]          PATIENT TYPE:  EMS   LOCATION:  ED                            FACILITY:  [facility]   PHYSICIAN:  Monte Fantasia, MD  DATE OF BIRTH:  [birth year]   DATE OF ADMISSION:  11/10/2007  DATE OF DISCHARGE:  LH                              HISTORY & PHYSICAL   HISTORY:  Patient is a 40 year old female, brought in with the chief  complaints of nausea and vomiting, two episodes since last night.  Patient known history of diabetes, type 1, since 86.  Has been on  insulin and monitors blood sugars at home.  On admission, the patient's  blood sugars were recorded to be high with sugars running around 400s.  She has complaints of high uncontrolled blood sugars since quite a few  months, around 200s to 300s.  Follows up with the primary care physician  every three to four months.  No complaints of chest pain.  No history of  any fevers, cough, cold or pain on passing urine.  The patient's vomitus  is clear.  No evidence of any blood in the vomitus.  No history of any  dizziness.  No history of shortness of breath or diarrhea or fever.   ALLERGIES:  The patient has no known drug allergies.   MEDICATIONS:  The patient is on Synthroid once a day and Humalog insulin  pump.   Last menstrual period was on October 27, 2007.   PAST MEDICAL HISTORY:  Significant for diabetes and hypothyroidism.   SOCIAL HISTORY:  Patient nonsmoker, nondrinker, no history of any IV  drug use, no history of recent travel or air travel.   IMMUNIZATION STATUS:  Not known.   PHYSICAL EXAM:  VITAL SIGNS:  Pulse of 115 on admission and then repeat  has been 96.  Respiratory rate of 18, blood pressure of 110/90.  HEENT EXAMINATION:  The neck is supple.  No evidence of any dehydration.  No evidence of pallor.  No evidence of any icterus.  Pupils equal,  reacting to light.  RESPIRATORY SYSTEM EXAMINATION:  Air entry is  bilaterally equal.  No  evidence of any rales or rhonchi.  CVS EXAMINATION:  Both heart sounds, S1, S2, heard.  No murmurs, no  radiation of any murmurs.  ABDOMEN:  Soft.  Bowel sounds are heard.  No guarding, no rigidity, no  organomegaly.  EXTREMITIES:  No edema of feet.  Pedal pulses well-felt.   ADMISSION LABORATORY DATA:  Sodium 134, potassium 4, chloride 106,  bicarb 10, glucose 393, BUN 12, creatinine 1.1, calcium of 9.2.  White  blood cell count 10.1, hemoglobin 16.1, hematocrit 48.2, platelet count  of 285 with left shift of 78.  Urinalysis:  Leukocyte esterase is  negative, ketones positive, urine hemoglobin is negative, bilirubin is  negative, ketones are more than 80 in urine.  Blood sugar chem strip is  413.   ASSESSMENT AND PLAN:  Patient admitted for diabetic ketoacidosis, high  anion gap and metabolic acidosis.  To keep patient n.p.o. The patient  was started on  insulin drip on admission in ED and the sugars were  titrated hourly. Intravenous fluids, give normal saline at 100 mL per  hour.  Monitor chem strips every 1 hour.  If blood sugars come less than  250, to give patient D5-1/2 normal saline at 100 mL per hour and monitor  BMP every 12, VBG every 12.  When anion gap is normal, patient to be  started on diabetic diet and then continued on the insulin pump.  Advise  diabetic education, DVT and GI prophylaxis.      Monte Fantasia, MD  Electronically Signed     MP/MEDQ  D:  11/10/2007  T:  11/10/2007  Job:  [id number]

## 2010-06-04 NOTE — H&P (Signed)
[name], [name]             ACCOUNT NO.:  [id number]   MEDICAL RECORD NO.:  [id number]          PATIENT TYPE:  INP   LOCATION:  IC10                          FACILITY:  [facility]   PHYSICIAN:  Osvaldo Shipper, MD     DATE OF BIRTH:  04/01/1970   DATE OF ADMISSION:  06/02/2008  DATE OF DISCHARGE:  LH                              HISTORY & PHYSICAL   ENDOCRINOLOGIST:  Dr. Terie Purser, [facility]   ADMITTING DIAGNOSIS:  1. Severe diabetic ketoacidosis.  2. History of insulin-dependent diabetes.  3. Hypothyroidism.  4. Depression.   CHIEF COMPLAINT:  Nausea and vomiting.   HISTORY OF PRESENT ILLNESS:  Patient is a 40 year old Caucasian female  who has a past medical history of insulin-dependent diabetes and was  actually last admitted back in April for DKA.  She tells me that, after  she was discharged, she went to see her endocrinologist, who repeated  her blood work and did not change any settings on her insulin pump and  everything was going great until this morning, when she woke up and  started having nausea and vomiting again.  Denies any abdominal pain, no  cough, no dysuria.  Denies any joint pains.  Did notice a skin rash at  her right ankle, which is non-itching and without any pain.  Denies  noncompliance.  Denies any other history that would give any indication  of precipitant factor. The last time she threw up was this morning and  is very keen on consuming some liquids.   MEDICATIONS AT HOME:  1. Synthroid 88 mcg daily.  2. Zoloft unknown dose daily.  3. She is on an insulin pump and basal rate changes depending on      different times of the day, but it ranges from 1.0 to 1.6 with the      total of 34.9 units per day and she boluses herself according to      the following:  1.4 units per 15 carbs.  Hemoglobin A1c that she      knows last checked was 11.1.  This was about four weeks ago.   ALLERGIES:  No known drug allergies.   PAST MEDICAL HISTORY:   Positive for insulin-dependent diabetes,  hypothyroidism, depression.  She had a C-section, cataract surgeries.  She is a diabetic since 1991.   SOCIAL HISTORY:  Lives in [place].  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  No smoking use.  Occasional alcohol use.  No illicit drug use.   FAMILY HISTORY:  Positive for coronary artery disease.   REVIEW OF SYSTEMS:  GENERAL REVIEW OF SYSTEMS:  Positive for weakness,  malaise.  HEENT:  Unremarkable.  CARDIOVASCULAR:  Unremarkable.  RESPIRATORY:  Unremarkable.  GI:  As in HPI.  GU:  Unremarkable.  MUSCULOSKELETAL:  Unremarkable.  NEUROLOGIC:  Unremarkable.  PSYCHIATRIC:  Unremarkable.  DERMATOLOGIC:  As in HPI.  OTHER SYSTEMS:  Unremarkable.   PHYSICAL EXAMINATION:  VITAL SIGNS:  She is afebrile, heart rate in the  90s, regular respiratory rate is about 16, blood pressure 97/50.  She is  chronically hypotensive.  Sats are 99% on 2 L.  GENERAL EXAM:  She is a thin, white female, in no distress.  HEENT:  There is no pallor, no icterus.  Oral mucosal membrane is very  dry.  No oral lesions are noted.  NECK:  Soft and supple.  No thyromegaly is appreciated, no  lymphadenopathy is noted.  LUNGS:  Clear to auscultation bilaterally.  No wheezing, rales or  rhonchi, no dullness to percussion.  CARDIOVASCULAR:  S1, S2 is normal, regular, no murmurs appreciated.  No  S3, S4, no rubs, no bruits.  Peripheral pulses are palpable.  No edema  is noted.  ABDOMEN:  Soft, nontender, nondistended.  Bowel sounds are present.  No  masses or organomegaly is appreciated.  GU:  Deferred.  MUSCULOSKELETAL:  Unremarkable.  SKIN:  She does have a macular rash, almost like petechiae, in the  medial aspect of her right ankle.  No other rashes are noted.  JOINTS:  Unremarkable.  NEUROLOGIC:  She is alert, oriented times three.  No focal neurological  deficits are present.   LABORATORY DATA:  Her ABG showed a pH of 7.038, pCO2 is 11, pO2 is 137,  bicarb is 2.8,  sats 97%.  White count is 16,500, hemoglobin 15.3,  platelet count 366.  Initial BMET showed a bicarb of 6 with anion gap of  23, glucose 442, BUN is 16, creatinine is 1.28.  UA does not show UTI.  No imaging studies have been done.   ASSESSMENT:  This is a 40 year old Caucasian female with a history of  insulin-dependent diabetes on insulin pump who presents with nausea and  vomiting and has evidence for diabetic ketoacidosis.  No precipitant  factor identified.  She has leukocytosis and polycythemia, which are  both result of volume-depletion.  She is also extremely dehydrated.   PLAN:  1. DKA protocol will be utilized.  She will be on insulin drip.  Her      insulin pump will be removed.  Intravenous fluids will be given,      which will be changed over to D5 when CBG goes below 250.      Electrolytes, including potassium and phosphorus levels, will be      followed up on.  Hemoglobin A1c will be repeated.  2. Dehydration.  Aggressive IV hydration will be provided.  3. Metabolic acidosis as a result of diabetic ketoacidosis.  At this      point, we will not utilize bicarb.  4. DVT and GI prophylaxis will be utilized.  5. Further management decisions will depend on results of further      testing and patient's response to treatment.      Osvaldo Shipper, MD  Electronically Signed    GK/MEDQ  D:  06/02/2008  T:  06/02/2008  Job:  [id number]   cc:   Dr. Roanna Raider, Endocrinologist, [place]

## 2010-06-04 NOTE — H&P (Signed)
[name], [name]             ACCOUNT NO.:  [id number]   MEDICAL RECORD NO.:  [id number]          PATIENT TYPE:  INP   LOCATION:  IC10                          FACILITY:  [facility]   PHYSICIAN:  Dorris Singh, DO    DATE OF BIRTH:  [birth year]   DATE OF ADMISSION:  05/08/2008  DATE OF DISCHARGE:  LH                              HISTORY & PHYSICAL   CHIEF COMPLAINT:  Nausea and vomiting.   HISTORY OF PRESENT ILLNESS:  The patient is a 40 year old Caucasian  female who presented to [facility] emergency room with a chief complaint  of nausea, vomiting.  When she was evaluated, she was found have a blood  sugar of 400.  Currently she has been a juvenile diabetic and has an  insulin pump.  She stated that she had a normal blood sugar yesterday  but she has been drinking a lot of water.  Today she started having  nausea and vomiting and dry mouth and fast breathing.  The patient  denies missing any meds or eating poorly.  States that nothing has made  it better; nothing has made it worse.  She has been admitted to our  service about a year ago for the same situation of being in DKA.   PAST MEDICAL HISTORY:  1. Insulin-dependent diabetes.  2. Hypothyroidism.   She has had an insulin pump since 1991.   SOCIAL HISTORY:  Nonsmoker, nondrinker.  No drug abuse and she is  currently working.   ALLERGIES:  No known drug allergies.   MEDICATIONS:  She is on to medications which include Synthroid which we  do not have a dose once a day and Humalog which is subcu which is for  her insulin pump.   REVIEW OF SYSTEMS:  CONSTITUTIONAL:  Positive for fatigue and appetite  changes.  EYES:  Negative for blurred vision.  EARS, NOSE, MOUTH, AND  THROAT:  Positive for extreme dry mouth.  CARDIOVASCULAR:  Negative for  chest pain, peripheral edema.  RESPIRATORY:  Negative for cough.  GASTROINTESTINAL:  Positive for nausea and vomiting.  Negative for  diarrhea, abdominal pain and bloody stools.  GU:  Negative for dysuria,  hematuria.  MUSCULOSKELETAL: Negative for back pain and neck pain.  SKIN:  Negative for pruritus or rash.  NEURO:  Negative for headache,  weakness or numbness.  PSYCHIATRIC:  Negative for depression.   PHYSICAL EXAMINATION:  VITAL SIGNS:  Blood pressure is 127/67, pulse  rate is 111, respirations 24.  Temperature 96.7, pulse oximetry 100%.  GENERAL:  The patient is a 40 year old Caucasian female who is well-  developed, well-nourished and she is uncomfortable-appearing and appears  to be in mild distress.  HEENT:  Normocephalic, atraumatic.  Eyes are normal.  EOMI.  No scleral  icterus or conjunctival injection.  Ears: Tympanic membranes are  visualized bilaterally.  Turbinates are dry and mucous membranes are  dry.  NECK:  Supple, full range of motion.  No lymphadenopathy noted.  HEART:  Regular rate and rhythm.  Positive tachycardia.  RESPIRATIONS:  Clear to auscultation bilaterally.  No rales, wheezes or  rhonchi.  She is tachypneic.  ABDOMEN:  Soft, nontender, nondistended.  No hepatosplenomegaly.  Normal bowel sounds.  EXTREMITIES:  Positive  pulses.  No edema, ecchymosis or cyanosis.  GU:  Positive Foley placement noted.  NEURO:  Cranial nerves II-XII grossly intact.  Good speech, good  coordination.  SKIN:  Cool and dry.  There are no abrasion source or rashes noted.   LABORATORY DATA:  Her chest x-ray was within normal limits.  Her ABG is  pH is 7.023, pCO2 7.7, pO2 148, bicarbonate 1.9.  Her acid base deficit  is 27.7, oxygen saturation is 97.6.  Her sodium is 137, potassium 4.7,  chloride 108, carbon dioxide 5, glucose 405, BUN 12, and creatinine 1.3.  She has moderate acetone in her blood and her capillary glucose was 426.  White count 13.1, hemoglobin 15.3, hematocrit 46.1 and platelet count of  357,000.   ASSESSMENT/PLAN:  1. Diabetic ketoacidosis.  2. Insulin dependent diabetes.   PLAN:  Plan will be to admit the patient to the service of  [facility] and  placed in the ICU.  Will start the DKA  protocol.  She has been started  on the [employer]  Will make sure we keep her potassium replaced and  due to her ion gap of being almost 30, and her bicarbonate level being  less than 2, will give her 2 ampules of bicarb while she is here in the  ED.  Will follow the diabetic ketoacidosis protocol as well as the  Glucomander protocol and once her blood sugars are within normal limits,  will switch her to the glycemic control orders.  Will get blood work in  the morning and will continue to monitor her.  Will do DVT and GI  prophylaxis.  Will hold off on her Synthroid, but will start it as soon  as she is no longer n.p.o. and once we get a dose.  Will get diabetes  instruction to come talk with her when she is stable and will help to  adjust her medications once she is stable enough to prevent any further  episodes.  Currently we will continue to monitor her and change therapy  as necessary.      Dorris Singh, DO  Electronically Signed     CB/MEDQ  D:  05/08/2008  T:  05/09/2008  Job:  [id number]

## 2010-06-04 NOTE — Discharge Summary (Signed)
[name], [name]             ACCOUNT NO.:  [id number]   MEDICAL RECORD NO.:  [id number]          PATIENT TYPE:  INP   LOCATION:  A210                          FACILITY:  [facility]   PHYSICIAN:  Osvaldo Shipper, MD     DATE OF BIRTH:  1970-11-22   DATE OF ADMISSION:  02/17/2007  DATE OF DISCHARGE:  01/30/2009LH                               DISCHARGE SUMMARY   The patient's primary medical doctor is located in [place].   Please review H and P, dictated at the time of admission, for details  regarding patient's presenting illness.   DISCHARGE DIAGNOSES:  1. Diabetic ketoacidosis, resolved.  2. 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  3. Hypothyroidism.  4. Mild renal insufficiency.   BRIEF HOSPITAL COURSE:  Briefly, this is a 40 year old Caucasian female,  who has insulin dependent diabetes, who uses an insulin pump, who  presented to the hospital with nausea, vomiting and diarrhea.  Patient  was noted to be dehydrated.  She also had leukocytosis of 24,000 and she  was found to be in diabetic ketoacidosis, as well.  Patient was started  on the DKA protocol with insulin drip.  Her acidosis slowly resolved.  She was also hypokalemic, which was also corrected.  Renal function also  improved rather quickly.  Her symptoms of gastroenteritis also resolved  and she has been tolerating p.o. intake as of yesterday.  Her blood  pressures have been running low.  Her blood pressures systolic have been  ranging between 75 and 90.  This morning, it was checked in front of me.  It was 100/68.  With these low pressures she has been completely  asymptomatic and has been ambulating in the hallways.  She is not on any  antihypertensive agents.   Also on admission, she was complaining of upper abdominal pain, which  was thought to be because of the gastritis, related to her symptoms.  We  checked an amylase and lipase, which came back at 1274 of amylase  and  lipase was actually 45.  Recheck of these showed that the amylase did  come down to 631 today and lipase continued to be normal.  It is felt  that this hyperamylasemia is likely secondary to her nausea and  vomiting, rather than a pancreatic process.  An ultrasound of the  abdomen was done, which was actually a technically good study, which did  not reveal any evidence for pancreatitis.  No other pancreatic  abnormalities were noted.  So I think she will need followup blood work  for amylase and if amylase continues to be elevated, she will need  further evaluation, maybe a pelvic ultrasound to rule out ovarian  tumors, and maybe even a GI workup.   The patient today is feeling much better.  She is very keen on going  home, does not have any symptoms.  Her pain in the abdomen has resolved.  Apart from her blood pressure that was discussed earlier, her other  vital signs are all stable.  Her blood work today shows CBC is  completely  normal.  Her BMET shows, this morning, a glucose of 203,  bicarb of 22, creatinine 1.9.   I think, considering her overall clinical situation, she is stable for  discharge.   DISCHARGE MEDICATIONS:  1. Insulin pump as before.  2. Synthroid 88 mcg p.o. daily.   No new medications have been added.  She did get about three days of  Levaquin, which we need not continue at this time.   FOLLOWUP BLOOD WORK:  She will need a BMET and amylase and lipase on  February 2, which will need to be followed up by her PMD, and then  further evaluation as discussed above.   FOLLOWUP:  I have told the patient to call the PMD's office today so  that she can be made an appointment to be seen early next week. Further  followup with GI and a pelvic ultrasound maybe needed depending on blood  work drawn next week, as well.   Total time spent on discharge:  About 40 minutes.      Osvaldo Shipper, MD  Electronically Signed     GK/MEDQ  D:  02/19/2007  T:   02/19/2007  Job:  [id number]

## 2010-06-04 NOTE — Discharge Summary (Signed)
NAMETRULY, STANKIEWICZ             ACCOUNT NO.:  [id number]   MEDICAL RECORD NO.:  [id number]          PATIENT TYPE:  INP   LOCATION:  A334                          FACILITY:  [facility]   PHYSICIAN:  Monte Fantasia, MD  DATE OF BIRTH:  1970/08/08   DATE OF ADMISSION:  11/10/2007  DATE OF DISCHARGE:  10/22/2009LH                               DISCHARGE SUMMARY   HISTORY OF PRESENT ILLNESS:  A 40 year old lady patient was admitted  yesterday with DKA with associated nausea and vomiting, two episodes.  The patient was started on insulin drip and given IV fluid hydration.  The patient's BMP and VBG were monitored q.4 h.  The bicarb improved  gradually from 8 to 19.  At present, the patient is asymptomatic and  feeling much better.  Blood sugars are well controlled.  At present, the  patient has been switched on to insulin pump.  We will discontinue the  insulin drip within an hour, and the patient is on full oral diet.   PHYSICAL EXAMINATION:  VITAL SIGNS:  Temperature is 98, pulse is 80,  respirations 18, blood pressure is 83/46, and oxygen saturations is 100%  on room air.  HEENT:  No pallor.  Oral mucosa moist.  NECK:  Supple.  No lymphadenopathy.  RESPIRATORY SYSTEM:  Air entry is bilaterally equal.  No rales.  No  rhonchi.  CVS:  S1 and S2.  Regular rate and rhythm.  No murmurs.  ABDOMEN:  Soft.  No organomegaly.  No distention.  No tenderness.  No  masses are palpable.  EXTREMITIES:  No edema of feet.   LABORATORY DATA:  Total WBC count of 10.1, H&H 16.1/48, platelets of 85,  and neutrophils of 78.  Glucose sticks is 87 and 218.  Sodium 139,  potassium 3.6, chloride is 117, bicarb is 19, glucose is 83, BUN is 7,  and creatinine is 0.6.  The patient's UA is nitrite negative, leukocytes  negative, and urine pregnancy test is negative.   ASSESSMENT AND PLAN:  Diabetes ketoacidosis, resolved, type 1 diabetes,  and hypothyroidism.  The patient's insulin drip will be discontinued  within an hour.  Accu-Checks monitored q.1 h.  Initially, the patient  was started on insulin pump with her regular schedule.  We will monitor  her chem-strips and also diabetes teaching provided.  The patient  advised to go to see her primary care physician within a week, will  continue rest of her medications as per her home medications.  If the  patient's chem-strips are well controlled, will be planned to discharge  today.  Deep venous thrombosis and gastrointestinal prophylaxis given.      Monte Fantasia, MD  Electronically Signed     MP/MEDQ  D:  11/11/2007  T:  11/12/2007  Job:  [id number]

## 2010-06-04 NOTE — Discharge Summary (Signed)
[name], [name]             ACCOUNT NO.:  [id number]   MEDICAL RECORD NO.:  [id number]          PATIENT TYPE:  INP   LOCATION:  IC10                          FACILITY:  [facility]   PHYSICIAN:  Osvaldo Shipper, MD     DATE OF BIRTH:  1970/11/11   DATE OF ADMISSION:  06/02/2008  DATE OF DISCHARGE:  LH                               DISCHARGE SUMMARY   Please review History and Physical dictated at the time of admission for  details regarding the patient's presenting illness.   ENDOCRINOLOGIST:  Ocie Cornfield, MD   FINAL DIAGNOSES:  1. Severe diabetic ketoacidoses, resolved.  2. Poorly controlled insulin dependent diabetes mellitus on insulin      pump.  3. HbA1c of 10.8.  4. History of hypothyroidism.  5. Depression.   BRIEF HOSPITAL COURSE:  This is a 40 year old Caucasian female who has a  past medical history of insulin dependent diabetes mellitus on an  insulin pump, hypothyroidism and depression who presented to the  hospital with nausea, vomiting and was found to have severe DKA.  Her  arterial gas showed pH of 7.03.  She had an elevated anion gap, no  precipitating factor for DKA was identified.  The patient's insulin pump  was taken off and she was put on an insulin drip.  She did not report  noncompliance.  She did not run out of insulin.  With the typical DKA  management, her blood sugars improved, her anion gap corrected.  She has  been tolerating p.o. intake for the past day and a half. She was  switched back to her insulin pump yesterday evening.  She experienced 2  episodes of hypoglycemia, one at midnight at the level of 46 and one at  5:30 this morning, 47.  Subsequent blood sugars have been okay.  She  denies any symptoms associated with this hypoglycemic event.  She denies  any change in her basal rate.   The rest of her medical issues are stable.  She has chronic hypotension  without any symptoms.  She was also severely dehydrated which also  corrected with IV  hydration.  Her phosphorus levels have been running  low for which she has been getting Neutra-Phos.   The plan is that if this patient's blood sugars stay okay and she  tolerates her lunch, she should be able to go home.   On the day of discharge, the patient is feeling great.  She is very keen  on getting discharged.  Denies any complaints whatsoever.  Vital signs,  except for the low blood pressure, are all stable.  Examination is  otherwise unremarkable. Abdomen is soft, lungs are clear to  auscultation.  Cardiovascular, S1 and S2 normal and regular.   Her labs this morning are completely unremarkable.  She is mildly anemic  and her phosphorus level has improved but still slightly low at 1.7.   DISCHARGE MEDICATIONS:  1. Insulin pump at her current settings.  Basal rate changes depending      on the time of day and are as follows:  Midnight to 3 a.m., 1.5  units/hr; from 3 a.m. to 7:30 a.m., 1 unit/hr; from 7:30 to 11      a.m., 1.6 units/hr; from 11 a.m. to 3 p.m., 1.6 units/heart rate;      from 3 p.m. to 8 p.m., 1.5 unit/hr; and from 8 p.m. to midnight,      1.6 units/hr.  She boluses herself 1 unit for 15 grams of      carbohydrates.  Her target CBG is 130.  2. Synthroid 88 mcg daily.  3. Zoloft unknown dose daily.   DIET:  Carb modified.  She is asked to take a midnight snack as well.  Ideally she should be eating small quantities of meals multiple times a  day.   ACTIVITIES:  No restrictions.   FOLLOW UP:  Follow-up with her endocrinologist in one week.   CONSULTANTS:  None.   PROCEDURES:  None.   IMAGING STUDIES:  None.   Total time on this discharge encounter 35 minutes.   ADDENDUM: She experienced hypoglycemia which prompted lowering of the  basal rate. She was monitored for few more hours and remained stable and  so was discharged.      Osvaldo Shipper, MD  Electronically Signed     GK/MEDQ  D:  06/04/2008  T:  06/04/2008  Job:  [id number]   cc:    Ocie Cornfield  Fax: [phone]

## 2010-06-04 NOTE — Group Therapy Note (Signed)
[name], [name]             ACCOUNT NO.:  [id number]   MEDICAL RECORD NO.:  [id number]          PATIENT TYPE:  INP   LOCATION:  IC10                          FACILITY:  [facility]   PHYSICIAN:  Skeet Latch, DO    DATE OF BIRTH:  Apr 27, 1970   DATE OF PROCEDURE:  05/10/2008  DATE OF DISCHARGE:                                 PROGRESS NOTE   SUBJECTIVE:  Patient seen doing appropriate.  No nausea, vomiting,  abdominal pain at this time.  Patient denies any fever, chills,  lightheadedness, dizziness, chest pain, or abdominal pain.  Patient  continues to improve.   OBJECTIVE:  VITAL SIGNS:  Temperature 98, heart rate 104, respiratory  rate 18, blood pressure 96/__________.  GENERAL:  She is awake and alert.  In no acute distress.  CARDIOVASCULAR:  S1 and S2 are normal, regular.  No murmurs, rubs or  gallops.  LUNGS:  Clear to auscultation bilaterally.  No rales, rhonchi or  wheezes.  ABDOMEN:  Soft, nontender, nondistended.  Positive bowel sounds.  No  organomegaly is appreciated.  EXTREMITIES:  No clubbing, cyanosis or edema.  NEUROLOGIC:  She is alert and oriented x3.   LABS:  Sodium 139, potassium 2.8, chloride 117, CO2 19, glucose 152, BUN  2, creatinine 0.44.  White count 8.4, hemoglobin 11.5, hematocrit 33.6,  platelet count 215,000.  ABG on room air reveals pH of 7.424, pCO2 27.7,  pO2 87.7, bicarb 17.8.   ASSESSMENT/PLAN:  1. Diabetic ketoacidosis:  Continues to improve.  Patient's insulin      drip has been discontinued.  Patient will be placed on a sliding      scale at this time.  Will try to get home insuliln dose to restart      patient's home pump.  Patient's hemoglobin A1C was 11.  Will      continue to monitor the patient's Accu-Cheks every 3 to 4 hours and      adjust her insulin as needed.  2. Hypotension:  Patient has chronic hypotension.  Her systolics are      in the 90's.  Will continue with fluids and continue to monitor her      closely.  3.  Hypokalemia:  Will continue to correct her potassium orally and      rehydrate if needed.  4. Deep venous thrombosis and gastrointestinal prophylaxis.  5. Abdominal tenderness:  None is appreciated today.  Continue to      monitor.  At this time, we will transfer patient to a general      medical bed and continue to monitor her closely.      Skeet Latch, DO  Electronically Signed     SM/MEDQ  D:  05/10/2008  T:  05/10/2008  Job:  [id number]

## 2010-06-04 NOTE — Group Therapy Note (Signed)
[name], [name]             ACCOUNT NO.:  [id number]   MEDICAL RECORD NO.:  [id number]          PATIENT TYPE:  INP   LOCATION:  IC10                          FACILITY:  [facility]   PHYSICIAN:  Osvaldo Shipper, MD     DATE OF BIRTH:  06-29-1970   DATE OF PROCEDURE:  05/09/2008  DATE OF DISCHARGE:                                 PROGRESS NOTE   SUBJECTIVE:  Patient is feeling very thirsty.  She denies anymore  episodes of nausea/vomiting since being in the ICU.  Denies any  dizziness or lightheadedness.  No chest pains.  No abdominal pain.  Patient reports periods that are ongoing at this time.   OBJECTIVE:  Ins and outs, she made about 1700 mL of urine yesterday  positive by 1800 mL.  VITAL SIGNS:  Show temperature 97.5.  Heart rate 96.  Blood pressure  90/52.  Respiratory rate of 16.  Saturation 100% on room air.  She is on  an insulin drip at one point 4 units per hour.  GENERAL EXAM:  This is a well-developed, well-nourished, white female,  lethargic, easily arousable, in no distress.  HEENT:  There is no pallor, no icterus.  Oral mucous membranes are  moist.  No oral lesions are noted.  NECK:  Soft and supple.  No thyromegaly is appreciated.  LUNGS:  Clear to auscultation anteriorly bilaterally.  CARDIOVASCULAR:  S1 and S2, normal, regular.  No murmurs appreciated.  No S3 or S4.  No rubs.  No bruits.  ABDOMEN:  Soft, nontender.  There is slight tenderness in the right  upper quadrant but Murphy sign was negative and there was no guarding,  rebound, or rigidity.  Bowel sounds are present.  No masses or  organomegaly appreciated.  GU EXAMINATION:  Deferred.  MUSCULOSKELETAL EXAM:  Unremarkable.  NEUROLOGICAL:  She is alert and oriented x3.  No focal neurological  deficits are present.  No rashes were noted on the skin.   LAB DATA:  Her sodium is 140, potassium is 3.4, chloride is 117, bicarb  is 8, anion gap is 15, glucose 142, BUN is 7, creatinine 0.7, calcium is  6.4.  We  do not have the albumin available.  UA done yesterday did not  show any evidence for infection.  It did show proteinuria in trace  amounts.  Large blood was noted without any RBCs.  PH on the ABG done  this morning was 7.29, pCO2 was 12.3, pO2 was 150, bicarb 5, oxygen  saturation 99%.  CBC done early this morning showed a hemoglobin of  13.4, white cell count is 19,000, and 90% neutrophils, no bands  reported.   IMAGING STUDY:  She had a chest x-ray which did not show any active  cardiopulmonary process.   ASSESSMENT AND PLAN:  1. Diabetic ketoacidosis, continue with insulin drip until acidosis is      completely corrected and we will switch her back to her insulin      pump.  She has not been throwing up; that is a good sign.  We will      put her  on diabetic clear liquids.  HbA1c if not ordered will be      checked.  She is currently on a D5 water drip since her CBGs have      come down below 200.  2. Hypotension.  She has chronic hypotension.  Review of previous      notes show that her blood pressure usually runs in the 90s systolic      and she is usually asymptomatic so we will go by her mean arterial      pressure and give her fluids as needed.  3. Electrolyte abnormality.  The potassium will be corrected.  We will      recheck electrolytes every 3 hours.  4. She is on deep venous thrombosis prophylaxis.  5. She is on stress ulcer prophylaxis.  6. Leukocytosis.  No clear source of infection.  She is afebrile.  We      will hold off on antibiotics for now.  7. Mild abdominal tenderness.  I am going to guess this is probably      because of the nausea/vomiting, not probably because of any de novo      process in the abdomen.  Review of an ultrasound done 1 year ago      showed no gallstones.  So I will continue to monitor this issue at      this time without any imaging studies.   Further management decisions will depend on results of further testing  and patient's response  to treatment.      Osvaldo Shipper, MD  Electronically Signed     GK/MEDQ  D:  05/09/2008  T:  05/09/2008  Job:  [id number]

## 2010-10-10 LAB — COMPREHENSIVE METABOLIC PANEL
ALT: 13
Alkaline Phosphatase: 61
BUN: 19
CO2: 22
CO2: 7 — CL
Calcium: 8.2 — ABNORMAL LOW
Calcium: 9.4
Creatinine, Ser: 1.38 — ABNORMAL HIGH
GFR calc non Af Amer: 29 — ABNORMAL LOW
GFR calc non Af Amer: 43 — ABNORMAL LOW
Glucose, Bld: 203 — ABNORMAL HIGH
Glucose, Bld: 451 — ABNORMAL HIGH
Potassium: 3.5
Sodium: 137
Sodium: 139
Total Bilirubin: 0.8
Total Protein: 7.2

## 2010-10-10 LAB — BASIC METABOLIC PANEL
BUN: 13
BUN: 13
BUN: 13
CO2: 13 — ABNORMAL LOW
CO2: 14 — ABNORMAL LOW
CO2: 16 — ABNORMAL LOW
CO2: 16 — ABNORMAL LOW
CO2: 18 — ABNORMAL LOW
CO2: 18 — ABNORMAL LOW
CO2: 18 — ABNORMAL LOW
Calcium: 7.7 — ABNORMAL LOW
Calcium: 7.9 — ABNORMAL LOW
Chloride: 112
Chloride: 117 — ABNORMAL HIGH
Chloride: 119 — ABNORMAL HIGH
Creatinine, Ser: 0.73
Creatinine, Ser: 0.86
Creatinine, Ser: 1.99 — ABNORMAL HIGH
GFR calc non Af Amer: >60
GFR calc non Af Amer: >60
Glucose, Bld: 115 — ABNORMAL HIGH
Glucose, Bld: 121 — ABNORMAL HIGH
Glucose, Bld: 140 — ABNORMAL HIGH
Glucose, Bld: 156 — ABNORMAL HIGH
Glucose, Bld: 360 — ABNORMAL HIGH
Potassium: 3.3 — ABNORMAL LOW
Potassium: 3.6
Potassium: 4
Sodium: 133 — ABNORMAL LOW
Sodium: 133 — ABNORMAL LOW
Sodium: 137
Sodium: 139

## 2010-10-10 LAB — CBC
HCT: 35.3 — ABNORMAL LOW
HCT: 46.5 — ABNORMAL HIGH
Hemoglobin: 13.2
Hemoglobin: 15.6 — ABNORMAL HIGH
MCHC: 33.5
MCHC: 34.1
MCHC: 34.7
MCV: 89
Platelets: 240
RBC: 4.37
RDW: 13.1
RDW: 13.3

## 2010-10-10 LAB — DIFFERENTIAL
Basophils Absolute: 0
Basophils Relative: 1
Eosinophils Absolute: 0.2
Lymphocytes Relative: 20
Lymphocytes Relative: 5 — ABNORMAL LOW
Lymphs Abs: 1.2
Monocytes Relative: 5
Neutro Abs: 22.3 — ABNORMAL HIGH
Neutrophils Relative %: 61
Neutrophils Relative %: 90 — ABNORMAL HIGH

## 2010-10-10 LAB — LIPASE, BLOOD: Lipase: 20

## 2010-10-10 LAB — URINALYSIS, ROUTINE W REFLEX MICROSCOPIC
Glucose, UA: 500 — AB
Leukocytes, UA: NEGATIVE
Protein, ur: 30 — AB
Specific Gravity, Urine: >1.03 — ABNORMAL HIGH

## 2010-10-10 LAB — URINE MICROSCOPIC-ADD ON

## 2010-10-10 LAB — HEPATIC FUNCTION PANEL
ALT: 14
AST: 12
Alkaline Phosphatase: 55
Bilirubin, Direct: 0.1
Total Bilirubin: 0.5

## 2010-10-10 LAB — GLUCOSE, RANDOM: Glucose, Bld: 440 — ABNORMAL HIGH

## 2010-10-21 LAB — GLUCOSE, CAPILLARY
Glucose-Capillary: 103 — ABNORMAL HIGH
Glucose-Capillary: 109 — ABNORMAL HIGH
Glucose-Capillary: 119 — ABNORMAL HIGH
Glucose-Capillary: 127 — ABNORMAL HIGH
Glucose-Capillary: 129 — ABNORMAL HIGH
Glucose-Capillary: 156 — ABNORMAL HIGH
Glucose-Capillary: 196 — ABNORMAL HIGH
Glucose-Capillary: 226 — ABNORMAL HIGH
Glucose-Capillary: 236 — ABNORMAL HIGH
Glucose-Capillary: 413 — ABNORMAL HIGH
Glucose-Capillary: 70
Glucose-Capillary: 78
Glucose-Capillary: 87
Glucose-Capillary: 87
Glucose-Capillary: 88

## 2010-10-21 LAB — BASIC METABOLIC PANEL
BUN: 11
BUN: 12
BUN: 7
BUN: 9
CO2: 14 — ABNORMAL LOW
CO2: 18 — ABNORMAL LOW
CO2: 19
Calcium: 7.9 — ABNORMAL LOW
Chloride: 115 — ABNORMAL HIGH
Chloride: 115 — ABNORMAL HIGH
Chloride: 117 — ABNORMAL HIGH
Creatinine, Ser: 0.61
Creatinine, Ser: 1.14
GFR calc Af Amer: >60
GFR calc Af Amer: >60
GFR calc non Af Amer: 53 — ABNORMAL LOW
GFR calc non Af Amer: >60
GFR calc non Af Amer: >60
Glucose, Bld: 190 — ABNORMAL HIGH
Potassium: 3.4 — ABNORMAL LOW
Potassium: 3.6
Potassium: 3.7
Potassium: 4.3
Sodium: 133 — ABNORMAL LOW
Sodium: 136

## 2010-10-21 LAB — DIFFERENTIAL
Basophils Absolute: 0
Eosinophils Absolute: 0.1
Lymphocytes Relative: 15
Lymphs Abs: 1.5
Neutrophils Relative %: 78 — ABNORMAL HIGH

## 2010-10-21 LAB — URINALYSIS, ROUTINE W REFLEX MICROSCOPIC
Bilirubin Urine: NEGATIVE
Ketones, ur: >80 — AB
Nitrite: NEGATIVE
Urobilinogen, UA: 0.2

## 2010-10-21 LAB — HEMOGLOBIN A1C
Hgb A1c MFr Bld: 11.2 — ABNORMAL HIGH
Mean Plasma Glucose: 275

## 2010-10-21 LAB — CBC
MCV: 88.8
Platelets: 285
RDW: 12.9
WBC: 10.1

## 2010-10-21 LAB — URINE CULTURE

## 2010-10-21 LAB — PREGNANCY, URINE: Preg Test, Ur: NEGATIVE

## 2010-10-31 LAB — CBC
Hemoglobin: 13.6
RBC: 4.65
RDW: 13.8
WBC: 8.7

## 2010-10-31 LAB — PREGNANCY, URINE: Preg Test, Ur: NEGATIVE

## 2010-10-31 LAB — URINE CULTURE: Colony Count: 95000

## 2010-10-31 LAB — URINALYSIS, ROUTINE W REFLEX MICROSCOPIC
Bilirubin Urine: NEGATIVE
Glucose, UA: >1000 — AB
Ketones, ur: NEGATIVE
Protein, ur: NEGATIVE
pH: 6

## 2010-10-31 LAB — DIFFERENTIAL
Basophils Absolute: 0.1
Lymphocytes Relative: 21
Lymphs Abs: 1.8
Monocytes Absolute: 0.4
Neutro Abs: 6.2

## 2010-10-31 LAB — BASIC METABOLIC PANEL
Calcium: 9.3
GFR calc Af Amer: >60
GFR calc non Af Amer: >60
Glucose, Bld: 144 — ABNORMAL HIGH
Sodium: 137

## 2010-10-31 LAB — URINE MICROSCOPIC-ADD ON

## 2010-11-28 DIAGNOSIS — S22009A Unspecified fracture of unspecified thoracic vertebra, initial encounter for closed fracture: Secondary | ICD-10-CM | POA: Insufficient documentation

## 2010-12-04 ENCOUNTER — Ambulatory Visit (HOSPITAL_COMMUNITY)
Admission: RE | Admit: 2010-12-04 | Discharge: 2010-12-04 | Disposition: A | Discharge status: Home / Self Care | Payer: BC Managed Care – PPO | Source: Ambulatory Visit | Attending: Orthopaedic Surgery | Admitting: Orthopaedic Surgery

## 2010-12-04 DIAGNOSIS — M6281 Muscle weakness (generalized): Secondary | ICD-10-CM | POA: Insufficient documentation

## 2010-12-04 DIAGNOSIS — IMO0001 Reserved for inherently not codable concepts without codable children: Secondary | ICD-10-CM | POA: Insufficient documentation

## 2010-12-04 DIAGNOSIS — R29898 Other symptoms and signs involving the musculoskeletal system: Secondary | ICD-10-CM | POA: Insufficient documentation

## 2010-12-04 DIAGNOSIS — R262 Difficulty in walking, not elsewhere classified: Secondary | ICD-10-CM | POA: Insufficient documentation

## 2010-12-04 DIAGNOSIS — M546 Pain in thoracic spine: Secondary | ICD-10-CM | POA: Insufficient documentation
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
## 2010-12-04 NOTE — Patient Instructions (Addendum)
HEP

## 2010-12-11 ENCOUNTER — Ambulatory Visit (HOSPITAL_COMMUNITY)
Admission: RE | Admit: 2010-12-11 | Discharge: 2010-12-11 | Disposition: A | Discharge status: Home / Self Care | Payer: BC Managed Care – PPO | Source: Ambulatory Visit | Attending: Orthopaedic Surgery | Admitting: Orthopaedic Surgery

## 2010-12-11 NOTE — Progress Notes (Signed)
Physical Therapy Treatment Patient Details  Name: [name] MRN: [id number] Date of Birth: [birth year]  Today's Date: 12/11/2010 Time: 4098-1191 Time Calculation (min): 39 min Visit#: 2  of 4   Re-eval: 01/03/11 Charges: Therex x 38'  Subjective: Symptoms/Limitations Symptoms: Pt reports HEP compliance. Pain Assessment Currently in Pain?: Yes Pain Score:   6 Pain Location: Thoracic Pain Orientation: Right;Mid   Exercise/Treatments Lumbar Exercises Scapular Retraction: 10 reps;Theraband Theraband Level (Scapular Retraction): Level 3 (Green) Row: 10 reps;Theraband Theraband Level (Row): Level 3 (Green) Shoulder Extension: Theraband Theraband Level (Shoulder Extension): Level 3 (Green) Stability Dead Bug: Other (comment) (x8) Bent Knee Raise: 10 reps;Supine Ab Set: 10 reps Isometric Hip Flexion: Other (comment) (x8) Straight Leg Raise: Other (comment) (x8 only) Straight Leg Raises Limitations: floating  Hip Abduction: 10 reps;Side-lying  Physical Therapy Assessment and Plan PT Assessment and Plan Clinical Impression Statement: Pt complete exercises with good core control. Pt had to stop at 8 repitions for the majority of mat exercises secondaruy to weakness. Began scapular tband exercises with minimal need for cueing after demo. HEP given for scapular tband therex. Pt reports slight increase in pain after exercises that she described as tolerable. PT Treatment/Interventions: Therapeutic exercise PT Plan: Begin prone exercises next session with pillow under stomach if needed.     Problem List Patient Active Problem List  Diagnoses  . Bilateral leg weakness    PT - End of Session Activity Tolerance: Patient tolerated treatment well General Behavior During Session: [facility] for tasks performed Cognition: [facility] for tasks performed  Antonieta Iba 12/11/2010, 3:26 PM

## 2010-12-18 ENCOUNTER — Telehealth (HOSPITAL_COMMUNITY): Payer: Self-pay

## 2010-12-18 ENCOUNTER — Inpatient Hospital Stay (HOSPITAL_COMMUNITY): Admission: RE | Admit: 2010-12-18 | Payer: BC Managed Care – PPO | Source: Ambulatory Visit

## 2010-12-25 ENCOUNTER — Ambulatory Visit (HOSPITAL_COMMUNITY)
Admission: RE | Admit: 2010-12-25 | Discharge: 2010-12-25 | Disposition: A | Discharge status: Home / Self Care | Payer: BC Managed Care – PPO | Source: Ambulatory Visit | Attending: Orthopaedic Surgery | Admitting: Orthopaedic Surgery

## 2010-12-25 DIAGNOSIS — R262 Difficulty in walking, not elsewhere classified: Secondary | ICD-10-CM | POA: Insufficient documentation

## 2010-12-25 DIAGNOSIS — IMO0001 Reserved for inherently not codable concepts without codable children: Secondary | ICD-10-CM | POA: Insufficient documentation

## 2010-12-25 DIAGNOSIS — R29898 Other symptoms and signs involving the musculoskeletal system: Secondary | ICD-10-CM

## 2010-12-25 DIAGNOSIS — M546 Pain in thoracic spine: Secondary | ICD-10-CM | POA: Insufficient documentation

## 2010-12-25 DIAGNOSIS — M6281 Muscle weakness (generalized): Secondary | ICD-10-CM | POA: Insufficient documentation

## 2010-12-25 NOTE — Patient Instructions (Signed)
HEP

## 2010-12-25 NOTE — Progress Notes (Signed)
Physical Therapy Treatment Patient Details  Name: [name] MRN: [id number] Date of Birth: [birth year]  Today's Date: 12/25/2010 Time: 4098-1191 Time Calculation (min): 45 min Visit#: 3  of 4   Re-eval: 01/03/11  there ex 40  Subjective: Symptoms/Limitations Symptoms: Pt states that she is not very sore.  Doing exercises once a day. Pain Assessment Currently in Pain?: Yes Pain Score:   3 Pain Location: Back Pain Orientation: Right   Exercise/Treatments Stretches   Lumbar Exercises Scapular Retraction: 10 reps;Theraband Theraband Level (Scapular Retraction): Level 4 (Blue) Row: 10 reps;Theraband Theraband Level (Row): Level 4 (Blue) Shoulder Extension: Theraband Theraband Level (Shoulder Extension): Level 4 (Blue) Stability Dead Bug: 15 reps Bridge: 10 reps Bent Knee Raise: 10 reps;Supine Isometric Hip Flexion: 10 reps Straight Leg Raise: 15 reps (x8 only) Straight Leg Raises Limitations: floating  Hip Abduction: Other (comment);Limitations (12 reps) Hip Abduction Limitations: 12 reps Heel Squeeze: 10 reps Leg Raise: 5 reps Plank:  (prone scapular retraction x 10) Lifting: Other (comment) (chin tuck head lift prone x10) Wall Slides: Limitations (wall push up x10)   Physical Therapy Assessment and Plan PT Assessment and Plan Clinical Impression Statement: good for with prone exercises PT Plan: reassess next visit.  begin SAR and opposite arm/leg raise.    Goals  I ex; no pain; not wearing thoracic brace  Problem List Patient Active Problem List  Diagnoses  . Bilateral leg weakness    PT - End of Session Activity Tolerance: Patient tolerated treatment well General Behavior During Session: [facility] for tasks performed Cognition: [facility] for tasks performed  [name],CINDY 12/25/2010, 3:19 PM

## 2011-01-01 ENCOUNTER — Ambulatory Visit (HOSPITAL_COMMUNITY)
Admission: RE | Admit: 2011-01-01 | Discharge: 2011-01-01 | Disposition: A | Discharge status: Home / Self Care | Payer: BC Managed Care – PPO | Source: Ambulatory Visit | Attending: Internal Medicine | Admitting: Internal Medicine

## 2011-01-01 NOTE — Progress Notes (Signed)
Physical Therapy Treatment Patient Details  Name: [name] MRN: [id number] Date of Birth: [birth year]  Today's Date: 01/01/2011 Time: 9147-8295 Time Calculation (min): 38 min Visit#: 4  of 8   Re-eval: 01/29/11 Charges: Therex x 24' MMT x 1  Subjective: Symptoms/Limitations Symptoms: Pt reports that she has not been able to do her exercises like she should because she has not been feeling well. Pt reports that she has a new order from MD but she forgot to bring it with her. Pain Assessment Currently in Pain?: Yes Pain Score:   6 Pain Location: Thoracic  Objective:  01/01/11 1400  RLE Strength  Right Hip Flexion 5/5  Right Hip Extension 4/5  Right Hip ABduction 5/5  Right Hip ADduction 4/5  Right Knee Flexion 4/5  Right Knee Extension 5/5  Right Ankle Dorsiflexion 5/5  LLE Strength  Left Hip Flexion 5/5  Left Hip Extension 4/5  Left Hip ABduction 5/5  Left Hip ADduction 4/5  Left Knee Flexion 5/5  Left Knee Extension 5/5  Left Ankle Dorsiflexion 5/5      Exercise/Treatments Stability Dead Bug: 15 reps Bridge: 10 reps Isometric Hip Flexion: 10 reps Straight Leg Raise: 10 reps Straight Leg Raises Limitations: floating  Heel Squeeze: 10 reps Leg Raise: 10 reps Wall Slides: Limitations Wall Slides Limitations: Wall push up x 10  Physical Therapy Assessment and Plan PT Assessment and Plan Clinical Impression Statement: Pt displays great gains in strength. Pt with good form and control with therex. PT Plan: Recommend to PT to continue 1xwk x 4wks.    Goals Home Exercise Program Pt will Perform Home Exercise Program: Independently PT Short Term Goals Time to Complete Short Term Goals: 2 weeks PT Short Term Goal 1: Pt to state that her pain has came down 3 levels PT Short Term Goal 1 - Progress: Met PT Long Term Goals Time to Complete Long Term Goals: 4 weeks PT Long Term Goal 1: Pt weaned out of turtle shell PT Long Term Goal 1 - Progress: Met PT  Long Term Goal 2: I in advance HEP PT Long Term Goal 2 - Progress: Progressing toward goal Long Term Goal 3: Pain no greater than a 2 Long Term Goal 3 Progress: Not met  Problem List Patient Active Problem List  Diagnoses  . Bilateral leg weakness    PT - End of Session Activity Tolerance: Patient tolerated treatment well General Behavior During Session: [facility] for tasks performed Cognition: [facility] for tasks performed  Antonieta Iba 01/01/2011, 3:14 PM

## 2011-01-08 ENCOUNTER — Ambulatory Visit (HOSPITAL_COMMUNITY): Payer: BC Managed Care – PPO | Admitting: *Deleted

## 2011-01-15 ENCOUNTER — Ambulatory Visit (HOSPITAL_COMMUNITY): Payer: BC Managed Care – PPO | Admitting: Physical Therapy

## 2011-01-22 ENCOUNTER — Inpatient Hospital Stay (HOSPITAL_COMMUNITY)
Admission: RE | Admit: 2011-01-22 | Payer: BC Managed Care – PPO | Source: Ambulatory Visit | Admitting: Physical Therapy

## 2011-01-29 ENCOUNTER — Inpatient Hospital Stay (HOSPITAL_COMMUNITY): Admission: RE | Admit: 2011-01-29 | Payer: BC Managed Care – PPO | Source: Ambulatory Visit | Admitting: *Deleted

## 2012-09-16 ENCOUNTER — Ambulatory Visit (INDEPENDENT_AMBULATORY_CARE_PROVIDER_SITE_OTHER): Payer: BC Managed Care – PPO | Admitting: Family Medicine

## 2012-09-16 ENCOUNTER — Ambulatory Visit: Payer: Self-pay | Admitting: Family Medicine

## 2012-09-16 ENCOUNTER — Encounter: Payer: Self-pay | Admitting: Family Medicine

## 2012-09-16 VITALS — BP 118/78 | Ht 62.0 in | Wt 137.2 lb

## 2012-09-16 DIAGNOSIS — M7502 Adhesive capsulitis of left shoulder: Secondary | ICD-10-CM

## 2012-09-16 DIAGNOSIS — M7500 Adhesive capsulitis of unspecified shoulder: Secondary | ICD-10-CM

## 2012-09-16 DIAGNOSIS — M25512 Pain in left shoulder: Secondary | ICD-10-CM

## 2012-09-16 DIAGNOSIS — E1065 Type 1 diabetes mellitus with hyperglycemia: Secondary | ICD-10-CM

## 2012-09-16 DIAGNOSIS — M25519 Pain in unspecified shoulder: Secondary | ICD-10-CM

## 2012-09-16 MED ORDER — DICLOFENAC SODIUM 75 MG PO TBEC: 75.0000 mg | DELAYED_RELEASE_TABLET | Freq: Two times a day (BID) | ORAL | Status: DC
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
## 2012-09-16 NOTE — Patient Instructions (Signed)
Please do exercises as shown

## 2012-09-17 DIAGNOSIS — M7500 Adhesive capsulitis of unspecified shoulder: Secondary | ICD-10-CM | POA: Insufficient documentation

## 2012-09-17 DIAGNOSIS — E109 Type 1 diabetes mellitus without complications: Secondary | ICD-10-CM | POA: Insufficient documentation

## 2012-09-17 DIAGNOSIS — E1065 Type 1 diabetes mellitus with hyperglycemia: Secondary | ICD-10-CM | POA: Insufficient documentation

## 2012-09-23 ENCOUNTER — Ambulatory Visit (HOSPITAL_COMMUNITY)
Admission: RE | Admit: 2012-09-23 | Discharge: 2012-09-23 | Disposition: A | Discharge status: Home / Self Care | Payer: BC Managed Care – PPO | Source: Ambulatory Visit | Attending: Family Medicine | Admitting: Family Medicine

## 2012-09-23 ENCOUNTER — Ambulatory Visit (HOSPITAL_COMMUNITY): Payer: Self-pay

## 2012-09-23 ENCOUNTER — Other Ambulatory Visit (HOSPITAL_COMMUNITY): Payer: Self-pay

## 2012-09-23 ENCOUNTER — Encounter (HOSPITAL_COMMUNITY): Payer: Self-pay

## 2012-09-23 DIAGNOSIS — M25519 Pain in unspecified shoulder: Secondary | ICD-10-CM | POA: Insufficient documentation

## 2012-10-06 ENCOUNTER — Ambulatory Visit (INDEPENDENT_AMBULATORY_CARE_PROVIDER_SITE_OTHER): Payer: BC Managed Care – PPO | Admitting: Orthopedic Surgery

## 2012-10-06 ENCOUNTER — Encounter: Payer: Self-pay | Admitting: Orthopedic Surgery

## 2012-10-06 VITALS — BP 104/64 | Ht 62.0 in | Wt 135.0 lb

## 2012-10-06 DIAGNOSIS — M7500 Adhesive capsulitis of unspecified shoulder: Secondary | ICD-10-CM

## 2012-10-06 DIAGNOSIS — M7502 Adhesive capsulitis of left shoulder: Secondary | ICD-10-CM

## 2012-10-06 DIAGNOSIS — M19019 Primary osteoarthritis, unspecified shoulder: Secondary | ICD-10-CM

## 2012-10-06 NOTE — Patient Instructions (Addendum)
Call to arrange therapy at [facility] have received a steroid shot. 15% of patients experience increased pain at the injection site with in the next 24 hours. This is best treated with ice and tylenol extra strength 2 tabs every 8 hours. If you are still having pain please call the office.

## 2012-10-06 NOTE — Progress Notes (Signed)
Patient ID: [name], female   DOB: 1970/07/19, 42 y.o.   MRN: [id number]   Chief complaint left shoulder pain  Chief Complaint  Patient presents with  . Shoulder Pain    Left shoulder and arm pain. Consult from Dr. Lubertha South    History of present illness this is a 42 year old diabetic history of right shoulder adhesive capsulitis treated with physical therapy successfully presents with a one-year history of ongoing left shoulder pain without trauma. Progressive loss of motion in the left shoulder with pain on the left deltoid and the left trapezius describes sharp throbbing stabbing. 7/10. Takes medication at night diclofenac otherwise it makes her sleepy she does report some numbness and tingling but it's above the elbow.  Review of systems blurred vision status post cataract surgery history of anxiety excessive thirst and excessive urination related to diabetes  Other review of systems completed and negative.   Vital signs are stable as recorded  General appearance is normal, body habitus normal  The patient is alert and oriented x 3  The patient's mood and affect are normal  Gait assessment: Normal noncontributory  The cardiovascular exam reveals normal pulses and temperature without edema or  swelling.  The lymphatic system in the cervical and axillary areas is negative for palpable lymph nodes  The sensory exam is normal.  There are no pathologic reflexes.  Balance is normal.   Exam of the cervical spine reveals no tenderness and full range of motion with some tenderness in the left trapezius muscle  Examination of the left shoulder  Inspection there is tenderness along the deltoid and a.c. joint primarily a.c. joint tenderness with restricted range of motion 60 of abduction 30 of extension with 15 of external rotation stability cannot be completely confirmed internal and external strength is grade 5 skin was normal without rash or laceration I cannot do  it and impingement provocative test  Chart he had an MRI which shows tendinosis and tendinopathy  Glenohumeral Joint: Mild degenerative changes for age.   Acromioclavicular Joint: Advanced degenerative changes for age with pannus formation, spurring and subchondral cystic change. The acromion is relatively flat or type 1 and shape. No significant lateral downsloping. Glenohumeral Joint: Mild degenerative changes for age.   Acromioclavicular Joint: Advanced degenerative changes for age with pannus formation, spurring and subchondral cystic change. The acromion is relatively flat or type 1 and shape. No significant lateral downsloping. FINDINGS: Rotator cuff: Moderate rotator cuff tendinopathy/ tendinosis. Shallow bursal and articular surface tears involving the supraspinatus tendon. No discrete full-thickness retracted tear. Glenohumeral Joint: Mild degenerative changes for age.   Acromioclavicular Joint: Advanced degenerative changes for age with pannus formation, spurring and subchondral cystic change. The acromion is relatively flat or type 1 and shape. No significant lateral downsloping. FINDINGS: Rotator cuff: Moderate rotator cuff tendinopathy/ tendinosis. Shallow bursal and articular surface tears involving the supraspinatus tendon. No discrete full-thickness retracted tear. IMPRESSION: Moderate rotator cuff tendinopathy/tendinosis with bursal and articular surface tears involving the supraspinatus tendon.   Degenerated and likely torn posterior labrum.   Most of her symptoms center over the a.c. joint associated with a loss of motion. Diagnosis adhesive capsulitis with a.c. joint arthrosis  Recommend subacromial injection and physical therapy return in 6 weeks to check on progress  Subacromial Shoulder Injection Procedure Note  Pre-operative Diagnosis: left RC Syndrome  Post-operative Diagnosis: same  Indications: pain   Anesthesia: ethyl chloride   Procedure  Details   Verbal consent was obtained for the procedure. The  shoulder was prepped withalcohol and the skin was anesthetized. A 20 gauge needle was advanced into the subacromial space through posterior approach without difficulty  The space was then injected with 3 ml 1% lidocaine and 1 ml of depomedrol. The injection site was cleansed with isopropyl alcohol and a dressing was applied.  Complications:  None; patient tolerated the procedure well.

## 2012-10-12 ENCOUNTER — Ambulatory Visit (HOSPITAL_COMMUNITY)
Admission: RE | Admit: 2012-10-12 | Discharge: 2012-10-12 | Disposition: A | Discharge status: Home / Self Care | Payer: BC Managed Care – PPO | Source: Ambulatory Visit | Attending: Orthopedic Surgery | Admitting: Orthopedic Surgery

## 2012-10-12 DIAGNOSIS — IMO0001 Reserved for inherently not codable concepts without codable children: Secondary | ICD-10-CM | POA: Insufficient documentation

## 2012-10-12 DIAGNOSIS — M25619 Stiffness of unspecified shoulder, not elsewhere classified: Secondary | ICD-10-CM | POA: Insufficient documentation

## 2012-10-12 DIAGNOSIS — M6281 Muscle weakness (generalized): Secondary | ICD-10-CM | POA: Insufficient documentation

## 2012-10-12 DIAGNOSIS — E119 Type 2 diabetes mellitus without complications: Secondary | ICD-10-CM | POA: Insufficient documentation

## 2012-10-12 DIAGNOSIS — M25519 Pain in unspecified shoulder: Secondary | ICD-10-CM | POA: Insufficient documentation

## 2012-10-12 DIAGNOSIS — M7500 Adhesive capsulitis of unspecified shoulder: Secondary | ICD-10-CM | POA: Insufficient documentation

## 2012-10-12 NOTE — Evaluation (Signed)
Occupational Therapy Evaluation  Patient Details  Name: [name] MRN: [id number] Date of Birth: October 27, 1970  Today's Date: 10/12/2012 Time: 5621-3086 OT Time Calculation (min): 24 min OT eval 5784-6962 14' MFR 9528-4132 10'  Visit#: 1 of 18  Re-eval: 11/09/12  Assessment Diagnosis: Left Frozen Shoulder Next MD Visit: 11/18/12 Romeo Apple Prior Therapy: Outpatient therapy in [place] for right frozen shoulder  Authorization:    Authorization Time Period:    Authorization Visit#:   of     Past Medical History:  Past Medical History  Diagnosis Date  . Diabetes    Past Surgical History:  Past Surgical History  Procedure Laterality Date  . Caesarean      Subjective Symptoms/Limitations Symptoms: S: I have a hard time reaching out to the side.  Pertinent History: Sherian has been experiencing pain and decreased range of motion in her left shoulder for approx. 1 year. On 10/06/12 she received a cortizone shot which helped the pain slightly. Dr. Romeo Apple has referred patient to occupational therapy for evaluation and treatment.  Special Tests: FOTO Score: 57/100 Patient Stated Goals: To be able to use arm normally.  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  Dominant Hand: Right Vision -  History Baseline Vision: Wears glasses all the time  Cognition/Observation Cognition Overall Cognitive Status: Within Functional Limits for tasks assessed Arousal/Alertness: Awake/alert Orientation Level: Oriented X4  Sensation/Coordination/Edema    Additional Assessments LUE Assessment LUE Assessment:  (assessed seated. IR/ER Adducted) LUE AROM (degrees) Left Shoulder Flexion: 92 Degrees Left Shoulder ABduction: 90 Degrees Left Shoulder Internal Rotation: 72 Degrees Left Shoulder External Rotation: 16 Degrees LUE Strength LUE Overall Strength Comments: Strength not tested this date. Palpation Palpation: Max fascial restrictions in left upper arm, trapezius, and scapularis region.      Exercise/Treatments     Manual Therapy Manual Therapy: Myofascial release Myofascial Release: MFR to left upper arm, trapezius, and scapularis region to decrease fascial restrictions and increase joint mobility in a pain free zone.   Occupational Therapy Assessment and Plan OT Assessment and Plan Clinical Impression Statement: A:  Patient presents with increased pain and fascial restrictions and decreased mobility and strength in her left shoulder due to frozen shoulder.  Patient will benefit from skilled OT intervention to decrease pain and fascial restrictions and increase pain free mobility in her left shoulder region for return to prior level of independence with all daily,work, and leisure activites.   Rehab Potential: Excellent OT Frequency: Min 3X/week OT Duration: 6 weeks OT Treatment/Interventions: Self-care/ADL training;Therapeutic activities;Therapeutic exercise;Manual therapy;Modalities;Patient/family education OT Plan: P:  Patient will benefit from skilled OT intervention to decrease pain and fascial restrictions and increase pain free mobility in her left shoulder region for return to prior level of independence with all daily,work, and leisure activites. Treatment Plan:  MFR and  manual stretching to left shoulder region, AAROM, AROM exercises, proximal shoulder stabilty exercises.       Goals Short Term Goals Time to Complete  Short Term Goals: 3 weeks Short Term Goal 1: Patient will be educated on HEP.  Short Term Goal 2: Patient will increase left shoulder PROM to [facility] for increased independence shaving her underarm. Short Term Goal 3: Patient will increase her left shoulder strength to 3/5 for increased ability to wash her hair. Short Term Goal 4: Patient will decrease pain in her left shoulder to 4/10 or better when sleeping. Short Term Goal 5: Patient will decrease fascial restrictions to minimal in her left shoulder region.    Long Term Goals Time to Complete Long Term Goals: 6 weeks Long Term Goal 1: Patient will return to highest level of independence with all daily, leisure, and work activities.  Long Term Goal 2: Patient will increase her left shoulder AROM to WNL for increased ability to reach out and close car door once inside.  Long Term Goal 3: Patient will increase her left shoulder strength to 4-/5 for increased ability to lift items above shoulder level.   Long Term Goal 4: Patient will decrease pain in her left shoulder to 2/10 or less during daily activities. Long Term Goal 5: Patient will decrease fascial restriction in her left shoulder to min.  Problem List Patient Active Problem List   Diagnosis Date Noted  . Pain in joint, shoulder region 10/12/2012  . Muscle weakness (generalized) 10/12/2012  . Arthritis, shoulder region 10/06/2012  . 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 Frozen shoulder 09/17/2012  . Bilateral leg weakness 12/04/2010    End of Session Activity Tolerance: Patient tolerated treatment well General Behavior During Therapy: [facility] for tasks assessed/performed OT Plan of Care OT Home Exercise Plan: towel slides OT Patient  Instructions: handout -scanned Consulted and Agree with Plan of Care: Patient   Limmie Patricia, OTR/L,CBIS   10/12/2012, 1:27 PM  Physician Documentation Your signature is required to indicate approval of the treatment plan as stated above.  Please sign and either send electronically or make a copy of this report for your files and return this physician signed original.  Please mark one 1.__approve of plan  2. ___approve of plan with the following conditions.   ______________________________                                                          _____________________ Physician Signature                                                                                                             Date

## 2012-10-18 ENCOUNTER — Ambulatory Visit (HOSPITAL_COMMUNITY)
Admission: RE | Admit: 2012-10-18 | Discharge: 2012-10-18 | Disposition: A | Discharge status: Home / Self Care | Payer: BC Managed Care – PPO | Source: Ambulatory Visit | Attending: Family Medicine | Admitting: Family Medicine

## 2012-10-18 NOTE — Progress Notes (Signed)
Occupational Therapy Treatment Patient Details  Name: [name] MRN: [id number] Date of Birth: [birth year]  Today's Date: 10/18/2012 Time: 4098-1191 OT Time Calculation (min): 33 min MFR 4782-9562 10' Therex 1308-6578  23'  Visit#: 2 of 18  Re-eval: 11/09/12    Authorization:    Authorization Time Period:    Authorization Visit#:   of    Subjective Symptoms/Limitations Symptoms: S: We went to the pound and got two dogs. I went to walk my dog and I switched the leash to my left hand without thinking and he jerked on it. That hurt really bad. Pain Assessment Currently in Pain?: Yes Pain Score: 3  Pain Location: Shoulder Pain Orientation: Left Pain Type: Acute pain  Precautions/Restrictions  Precautions Precautions: None  Exercise/Treatments Supine Protraction: PROM;10 reps Horizontal ABduction: PROM;10 reps External Rotation: PROM;10 reps Internal Rotation: PROM;10 reps Flexion: PROM;10 reps ABduction: PROM;10 reps Seated Elevation: AROM;15 reps Extension: AROM;15 reps Row: AROM;15 reps Pulleys Flexion: 1 minute ABduction: 1 minute Therapy Ball Flexion: 15 reps ABduction: 15 reps ROM / Strengthening / Isometric Strengthening Thumb Tacks: 1' Prot/Ret//Elev/Dep: 1'       Manual Therapy Manual Therapy: Myofascial release Myofascial Release: MFR and manual stretching  to left upper arm, trapezius, and scapularis region to decrease fascial restrictions and increase joint mobility in a pain free zone.  Occupational Therapy Assessment and Plan OT Assessment and Plan Clinical Impression Statement: A: Patient had great results during manual stretching horizontal abduction achieving full range. Pain during all ranges of manual stretching.  OT Plan: P: Work on increasing PROM to [facility] to increase ability to reach above head during daily activities.    Goals Short Term Goals Time to Complete Short Term Goals: 3 weeks Short Term Goal 1: Patient will be  educated on HEP.  Short Term Goal 1 Progress: Progressing toward goal Short Term Goal 2: Patient will increase left shoulder PROM to [facility] for increased independence shaving her underarm. Short Term Goal 2 Progress: Progressing toward goal Short Term Goal 3: Patient will increase her left shoulder strength to 3/5 for increased ability to wash her hair. Short Term Goal 3 Progress: Progressing toward goal Short Term Goal 4: Patient will decrease pain in her left shoulder to 4/10 or better when sleeping. Short Term Goal 4 Progress: Progressing toward goal Short Term Goal 5: Patient will decrease fascial restrictions to minimal in her left shoulder region.    Short Term Goal 5 Progress: Progressing toward goal Long Term Goals Time to Complete Long Term Goals: 6 weeks Long Term Goal 1: Patient will return to highest level of independence with all daily, leisure, and work activities.  Long Term Goal 1 Progress: Progressing toward goal Long Term Goal 2: Patient will increase her left shoulder AROM to WNL for increased ability to reach out and close car door once inside.  Long Term Goal 2 Progress: Progressing toward goal Long Term Goal 3: Patient will increase her left shoulder strength to 4-/5 for increased ability to lift items above shoulder level.   Long Term Goal 3 Progress: Progressing toward goal Long Term Goal 4: Patient will decrease pain in her left shoulder to 2/10 or less during daily activities. Long Term Goal 4 Progress: Progressing toward goal Long Term Goal 5: Patient will decrease fascial restriction in her left shoulder to min. Long Term Goal 5 Progress: Progressing toward goal  Problem List Patient Active Problem List   Diagnosis Date Noted  . Pain in joint, shoulder region 10/12/2012  .  Muscle weakness (generalized) 10/12/2012  . Arthritis, shoulder region 10/06/2012  . 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 Type I (juvenile type) diabetes mellitus with unspecified  complication, uncontrolled 09/17/2012  . Frozen shoulder 09/17/2012  . Bilateral leg weakness 12/04/2010    End of Session Activity Tolerance: Patient tolerated treatment well General Behavior During Therapy: [facility] for tasks assessed/performed   Limmie Patricia, OTR/L,CBIS   10/18/2012, 10:53 AM

## 2012-10-20 ENCOUNTER — Ambulatory Visit (HOSPITAL_COMMUNITY)
Admission: RE | Admit: 2012-10-20 | Discharge: 2012-10-20 | Disposition: A | Discharge status: Home / Self Care | Payer: BC Managed Care – PPO | Source: Ambulatory Visit | Attending: Family Medicine | Admitting: Family Medicine

## 2012-10-20 DIAGNOSIS — M25619 Stiffness of unspecified shoulder, not elsewhere classified: Secondary | ICD-10-CM | POA: Insufficient documentation

## 2012-10-20 DIAGNOSIS — IMO0001 Reserved for inherently not codable concepts without codable children: Secondary | ICD-10-CM | POA: Insufficient documentation

## 2012-10-20 DIAGNOSIS — M6281 Muscle weakness (generalized): Secondary | ICD-10-CM | POA: Insufficient documentation

## 2012-10-20 DIAGNOSIS — E119 Type 2 diabetes mellitus without complications: Secondary | ICD-10-CM | POA: Insufficient documentation

## 2012-10-20 DIAGNOSIS — M7500 Adhesive capsulitis of unspecified shoulder: Secondary | ICD-10-CM | POA: Insufficient documentation

## 2012-10-20 DIAGNOSIS — M25519 Pain in unspecified shoulder: Secondary | ICD-10-CM | POA: Insufficient documentation

## 2012-10-20 NOTE — Progress Notes (Signed)
Occupational Therapy Treatment Patient Details  Name: [name] MRN: [id number] Date of Birth: 1970-09-19  Today's Date: 10/20/2012 Time: 1017-1100 OT Time Calculation (min): 43 min MFR 4010-2725 12' Therex 1029-1100 31'  Visit#: 3 of 18  Re-eval: 11/09/12    Authorization:    Authorization Time Period:    Authorization Visit#:   of    Subjective Symptoms/Limitations Symptoms: S: I was really sore when I left here on Monday.  Pain Assessment Currently in Pain?: Yes Pain Score: 6  Pain Location: Shoulder Pain Orientation: Left Pain Type: Acute pain  Precautions/Restrictions  Precautions Precautions: None  Exercise/Treatments Supine Protraction: PROM;AAROM;10 reps Horizontal ABduction: PROM;AAROM;10 reps External Rotation: PROM;AAROM;10 reps Internal Rotation: PROM;AAROM;10 reps Flexion: PROM;AAROM;10 reps ABduction: PROM;AAROM;10 reps Seated Elevation: AROM;15 reps Extension: AROM;15 reps Row: AROM;15 reps Therapy Ball Flexion: 15 reps ABduction: 15 reps ROM / Strengthening / Isometric Strengthening Thumb Tacks: 1'       Manual Therapy Manual Therapy: Myofascial release Myofascial Release: MFR and manual stretching to left upper arm, trapezius, and scapularis region to decrease fascial restrictions and increase joint mobility in a pain free zone.  Occupational Therapy Assessment and Plan OT Assessment and Plan Clinical Impression Statement: A: Patient continues to have great results with manual stretching. Pain durlng all ranges of manual stretching. Added AAROM supine. Patient tolerated well.  OT Plan: P: Work on increasing PROM to [facility] to increase ability to reach above head during daily activities.    Goals Short Term Goals Time to Complete Short Term Goals: 3 weeks Short Term Goal 1: Patient will be educated on HEP.  Short Term Goal 2: Patient will increase left shoulder PROM to [facility] for increased independence shaving her underarm. Short Term  Goal 3: Patient will increase her left shoulder strength to 3/5 for increased ability to wash her hair. Short Term Goal 4: Patient will decrease pain in her left shoulder to 4/10 or better when sleeping. Short Term Goal 5: Patient will decrease fascial restrictions to minimal in her left shoulder region.    Long Term Goals Time to Complete Long Term Goals: 6 weeks Long Term Goal 1: Patient will return to highest level of independence with all daily, leisure, and work activities.  Long Term Goal 2: Patient will increase her left shoulder AROM to WNL for increased ability to reach out and close car door once inside.  Long Term Goal 3: Patient will increase her left shoulder strength to 4-/5 for increased ability to lift items above shoulder level.   Long Term Goal 4: Patient will decrease pain in her left shoulder to 2/10 or less during daily activities. Long Term Goal 5: Patient will decrease fascial restriction in her left shoulder to min.  Problem List Patient Active Problem List   Diagnosis Date Noted  . Pain in joint, shoulder region 10/12/2012  . Muscle weakness (generalized) 10/12/2012  . Arthritis, shoulder region 10/06/2012  . 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 Frozen shoulder 09/17/2012  . Bilateral leg weakness 12/04/2010    End of Session Activity Tolerance: Patient tolerated treatment well General Behavior During Therapy: [facility] for tasks assessed/performed   Limmie Patricia, OTR/L,CBIS   10/20/2012, 11:35 AM

## 2012-10-22 ENCOUNTER — Ambulatory Visit (HOSPITAL_COMMUNITY)
Admission: RE | Admit: 2012-10-22 | Discharge: 2012-10-22 | Disposition: A | Discharge status: Home / Self Care | Payer: BC Managed Care – PPO | Source: Ambulatory Visit | Attending: Family Medicine | Admitting: Family Medicine

## 2012-10-22 NOTE — Progress Notes (Signed)
Occupational Therapy Treatment Patient Details  Name: Jaymarie Yeakel MRN: [id number] Date of Birth: 1970-02-02  Today's Date: 10/22/2012 Time: 7829-5621 OT Time Calculation (min): 44 min MFR 3086-5784 28' Therex 6962-9528 16'  Visit#: 4 of 18  Re-eval: 11/09/12    Authorization:    Authorization Time Period:    Authorization Visit#:   of    Subjective Symptoms/Limitations Symptoms: S: I took a pain pill before I came today.  Pain Assessment Currently in Pain?: Yes Pain Score: 5  Pain Location: Shoulder Pain Orientation: Left Pain Type: Acute pain  Precautions/Restrictions  Precautions Precautions: None  Exercise/Treatments Supine Protraction: PROM;AAROM;10 reps Horizontal ABduction: PROM;AAROM;10 reps External Rotation: PROM;AAROM;10 reps Internal Rotation: PROM;AAROM;10 reps Flexion: PROM;AAROM;10 reps ABduction: PROM;AAROM;10 reps Seated Elevation: AROM;15 reps Extension: AROM;15 reps Row: AROM;15 reps Protraction: AAROM;10 reps Horizontal ABduction: AAROM;10 reps External Rotation: AAROM;10 reps Internal Rotation: AAROM;10 reps Flexion: AAROM;10 reps Abduction: AAROM;10 reps Pulleys Flexion: 1 minute ABduction: 1 minute ROM / Strengthening / Isometric Strengthening Rhythmic Stabilization, Supine: 10X       Manual Therapy Manual Therapy: Myofascial release Myofascial Release: MFR and manual stretching to left upper arm, trapezius, and scapularis region to decrease fascial restrictions and increase joint mobility in a pain free zone  Occupational Therapy Assessment and Plan OT Assessment and Plan Clinical Impression Statement: A: Patient able to get almost functional range with supine PROM shoulder flexion. Added AAROM seated and supine proximal strengthening exercises. Patient tolerated well.  OT Plan: P: Add seated proximal strengthening exercise.   Goals Short Term Goals Time to Complete Short Term Goals: 3 weeks Short Term Goal 1: Patient  will be educated on HEP.  Short Term Goal 2: Patient will increase left shoulder PROM to [facility] for increased independence shaving her underarm. Short Term Goal 3: Patient will increase her left shoulder strength to 3/5 for increased ability to wash her hair. Short Term Goal 4: Patient will decrease pain in her left shoulder to 4/10 or better when sleeping. Short Term Goal 5: Patient will decrease fascial restrictions to minimal in her left shoulder region.    Long Term Goals Time to Complete Long Term Goals: 6 weeks Long Term Goal 1: Patient will return to highest level of independence with all daily, leisure, and work activities.  Long Term Goal 2: Patient will increase her left shoulder AROM to WNL for increased ability to reach out and close car door once inside.  Long Term Goal 3: Patient will increase her left shoulder strength to 4-/5 for increased ability to lift items above shoulder level.   Long Term Goal 4: Patient will decrease pain in her left shoulder to 2/10 or less during daily activities. Long Term Goal 5: Patient will decrease fascial restriction in her left shoulder to min.  Problem List Patient Active Problem List   Diagnosis Date Noted  . Pain in joint, shoulder region 10/12/2012  . Muscle weakness (generalized) 10/12/2012  . Arthritis, shoulder region 10/06/2012  . 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 Frozen shoulder 09/17/2012  . Bilateral leg weakness 12/04/2010    End of Session Activity Tolerance: Patient tolerated treatment well General Behavior During Therapy: [facility] for tasks assessed/performed   Limmie Patricia, OTR/L,CBIS   10/22/2012, 11:03 AM

## 2012-10-25 ENCOUNTER — Ambulatory Visit (HOSPITAL_COMMUNITY)
Admission: RE | Admit: 2012-10-25 | Discharge: 2012-10-25 | Disposition: A | Discharge status: Home / Self Care | Payer: BC Managed Care – PPO | Source: Ambulatory Visit | Attending: Family Medicine | Admitting: Family Medicine

## 2012-10-25 NOTE — Progress Notes (Signed)
Occupational Therapy Treatment Patient Details  Name: [name] MRN: [id number] Date of Birth: 1970-08-19  Today's Date: 10/25/2012 Time: 4098-1191 OT Time Calculation (min): 53 min Manual Therapy 4782-9562 20' Therapeutic Exercises [phone] 66' Visit#: 5 of 18  Re-eval: 11/09/12     Subjective S:  My dog knocked me out of my hammock yesterday.   Pain Assessment Currently in Pain?: Yes Pain Score: 5  Pain Location: Shoulder Pain Orientation: Left Pain Type: Acute pain  Precautions/Restrictions   progress as tolerated  Exercise/Treatments Supine Protraction: PROM;AROM;10 reps Horizontal ABduction: PROM;AROM;10 reps External Rotation: PROM;10 reps;AAROM;15 reps Internal Rotation: PROM;10 reps;AAROM;15 reps Flexion: PROM;10 reps;AAROM;15 reps ABduction: PROM;10 reps;AAROM;15 reps Seated Elevation: AROM;15 reps Extension: AROM;15 reps Row: AROM;15 reps Protraction: AAROM;12 reps Horizontal ABduction: AAROM;12 reps External Rotation: AAROM;12 reps Internal Rotation: AAROM;12 reps Flexion: AAROM;12 reps Abduction: AAROM;12 reps Pulleys Flexion: 2 minutes ABduction: 2 minutes Therapy Ball Flexion: 20 reps ABduction: 20 reps ROM / Strengthening / Isometric Strengthening Thumb Tacks: 1' Prot/Ret//Elev/Dep: 1' Rhythmic Stabilization, Supine: 10X Rhythmic Stabilization, Seated: begin next visit      Manual Therapy Manual Therapy: Myofascial release Myofascial Release: MFR and manual stretching to left upper arm, trapezius, and scapularis region to decrease fascial restrictions and increase joint mobility in a pain free zone  Occupational Therapy Assessment and Plan OT Assessment and Plan Clinical Impression Statement: A:  Patient transitioned to AROM in supine for protraction and horizontal abduction.  Continued with AAROM for all other ranges OT Plan: P:  Begin AROM in supine with flexion and abduction when AAROM is above 120 degrees.    Goals Short  Term Goals Time to Complete Short Term Goals: 3 weeks Short Term Goal 1: Patient will be educated on HEP.  Short Term Goal 1 Progress: Progressing toward goal Short Term Goal 2: Patient will increase left shoulder PROM to [facility] for increased independence shaving her underarm. Short Term Goal 2 Progress: Progressing toward goal Short Term Goal 3: Patient will increase her left shoulder strength to 3/5 for increased ability to wash her hair. Short Term Goal 3 Progress: Progressing toward goal Short Term Goal 4: Patient will decrease pain in her left shoulder to 4/10 or better when sleeping. Short Term Goal 4 Progress: Progressing toward goal Short Term Goal 5: Patient will decrease fascial restrictions to minimal in her left shoulder region.    Short Term Goal 5 Progress: Progressing toward goal Long Term Goals Time to Complete Long Term Goals: 6 weeks Long Term Goal 1: Patient will return to highest level of independence with all daily, leisure, and work activities.  Long Term Goal 1 Progress: Progressing toward goal Long Term Goal 2: Patient will increase her left shoulder AROM to WNL for increased ability to reach out and close car door once inside.  Long Term Goal 2 Progress: Progressing toward goal Long Term Goal 3: Patient will increase her left shoulder strength to 4-/5 for increased ability to lift items above shoulder level.   Long Term Goal 3 Progress: Progressing toward goal Long Term Goal 4: Patient will decrease pain in her left shoulder to 2/10 or less during daily activities. Long Term Goal 4 Progress: Progressing toward goal Long Term Goal 5: Patient will decrease fascial restriction in her left shoulder to min. Long Term Goal 5 Progress: Progressing toward goal  Problem List Patient Active Problem List   Diagnosis Date Noted  . Pain in joint, shoulder region 10/12/2012  . Muscle weakness (generalized) 10/12/2012  . Arthritis, shoulder region  10/06/2012  . Frozen shoulder  syndrome 10/06/2012  . 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 Frozen shoulder 09/17/2012  . Bilateral leg weakness 12/04/2010    End of Session Activity Tolerance: Patient tolerated treatment well General Behavior During Therapy: [facility] for tasks assessed/performed  GO    Shirlean Mylar, OTR/L  10/25/2012, 11:26 AM

## 2012-10-28 ENCOUNTER — Ambulatory Visit (HOSPITAL_COMMUNITY)
Admission: RE | Admit: 2012-10-28 | Discharge: 2012-10-28 | Disposition: A | Discharge status: Home / Self Care | Payer: BC Managed Care – PPO | Source: Ambulatory Visit | Attending: Family Medicine | Admitting: Family Medicine

## 2012-10-28 NOTE — Progress Notes (Signed)
Occupational Therapy Treatment Patient Details  Name: [name] MRN: [id number] Date of Birth: [birth year]  Today's Date: 10/28/2012 Time: 2952-8413 OT Time Calculation (min): 45 min Manual Therapy 1110-1131 21' Therapeutic exercises 1131-1155 24' Visit#: 6 of 18  Re-eval: 11/09/12    Subjective  S: I went shopping yesterday and my shoulder was a little sore yesterday.  Pain Assessment Pain Score: 3  Pain Location: Shoulder Pain Orientation: Left Pain Type: Acute pain  Precautions/Restrictions   progress as tolerated  Exercise/Treatments Supine Protraction: PROM;10 reps;AAROM;15 reps Horizontal ABduction: PROM;10 reps;AAROM;15 reps External Rotation: PROM;10 reps;AAROM;15 reps Internal Rotation: PROM;10 reps;AAROM;15 reps Flexion: PROM;10 reps;AAROM;15 reps ABduction: PROM;10 reps;AAROM;15 reps Seated Protraction: AAROM;12 reps Horizontal ABduction: AAROM;12 reps External Rotation: AAROM;12 reps Internal Rotation: AAROM;12 reps Flexion: AAROM;12 reps Abduction: AAROM;12 reps Therapy Ball Flexion: 20 reps ABduction: 20 reps Right/Left: 5 reps ROM / Strengthening / Isometric Strengthening Rhythmic Stabilization, Supine: 10X Rhythmic Stabilization, Seated: begin next visit      Manual Therapy Manual Therapy: Myofascial release Myofascial Release: MFR and manual stretching to left upper arm, trapezius, and scapularis region to decrease fascial restrictions and increase joint mobility in a pain free zone  Occupational Therapy Assessment and Plan OT Assessment and Plan Clinical Impression Statement: A:  Patient felt good release with AAROM horizontal abduction in supine this date. Able to complete ball circles. OT Plan: P:  Begin AROM in supine with flexion and abduction when AAROM is above 120 degrees.    Goals Short Term Goals Time to Complete Short Term Goals: 3 weeks Short Term Goal 1: Patient will be educated on HEP.  Short Term Goal 2: Patient will  increase left shoulder PROM to [facility] for increased independence shaving her underarm. Short Term Goal 3: Patient will increase her left shoulder strength to 3/5 for increased ability to wash her hair. Short Term Goal 4: Patient will decrease pain in her left shoulder to 4/10 or better when sleeping. Short Term Goal 5: Patient will decrease fascial restrictions to minimal in her left shoulder region.    Long Term Goals Time to Complete Long Term Goals: 6 weeks Long Term Goal 1: Patient will return to highest level of independence with all daily, leisure, and work activities.  Long Term Goal 2: Patient will increase her left shoulder AROM to WNL for increased ability to reach out and close car door once inside.  Long Term Goal 3: Patient will increase her left shoulder strength to 4-/5 for increased ability to lift items above shoulder level.   Long Term Goal 4: Patient will decrease pain in her left shoulder to 2/10 or less during daily activities. Long Term Goal 5: Patient will decrease fascial restriction in her left shoulder to min.  Problem List Patient Active Problem List   Diagnosis Date Noted  . Pain in joint, shoulder region 10/12/2012  . Muscle weakness (generalized) 10/12/2012  . Arthritis, shoulder region 10/06/2012  . 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 Frozen shoulder 09/17/2012  . Bilateral leg weakness 12/04/2010    End of Session Activity Tolerance: Patient tolerated treatment well General Behavior During Therapy: [facility] for tasks assessed/performed  GO    Shirlean Mylar, OTR/L  10/28/2012, 12:34 PM

## 2012-11-01 ENCOUNTER — Ambulatory Visit (HOSPITAL_COMMUNITY)
Admission: RE | Admit: 2012-11-01 | Discharge: 2012-11-01 | Disposition: A | Discharge status: Home / Self Care | Payer: BC Managed Care – PPO | Source: Ambulatory Visit | Attending: Family Medicine | Admitting: Family Medicine

## 2012-11-01 NOTE — Progress Notes (Signed)
Occupational Therapy Treatment Patient Details  Name: [name] MRN: [id number] Date of Birth: 10/07/1970  Today's Date: 11/01/2012 Time: 7829-5621 OT Time Calculation (min): 48 min Manual Therapy 3086-5784 25' Therapeutuic exercises 6962-9528 23' Visit#: 7 of 18  Re-eval: 11/09/12    Authorization:    Authorization Time Period:    Authorization Visit#:   of    Subjective Symptoms/Limitations Symptoms: S:  For some reason, its felt like it hasnt wanted to move at all.   Pain Assessment Currently in Pain?: Yes Pain Score: 3  Pain Location: Shoulder Pain Orientation: Left Pain Type: Acute pain  Precautions/Restrictions     Exercise/Treatments Supine Protraction: PROM;10 reps;AAROM;15 reps Horizontal ABduction: PROM;10 reps;AAROM;15 reps External Rotation: PROM;10 reps;AAROM;15 reps Internal Rotation: PROM;10 reps;AAROM;15 reps Flexion: PROM;10 reps;AAROM;15 reps ABduction: PROM;10 reps Seated Protraction: AAROM;15 reps Horizontal ABduction: AAROM;15 reps External Rotation: AAROM;15 reps Internal Rotation: AAROM;15 reps Flexion: AAROM;15 reps Abduction: AAROM;15 reps Pulleys Flexion: 2 minutes ABduction: 2 minutes    ROM / Strengthening / Isometric Strengthening Proximal Shoulder Strengthening, Seated: 10X with tactile  cues to depress shoulder blade  Rhythmic Stabilization, Supine: resume next visit      Manual Therapy Manual Therapy: Myofascial release Myofascial Release: MFR and manual stretching to left upper arm, trapezius, and scapularis region to decrease fascial restrictions and increase joint mobility in a pain free zone  Occupational Therapy Assessment and Plan OT Assessment and Plan Clinical Impression Statement: A:  Added proximal shoulder strnegthening in seated with tactile cues for depressing shoulder blade.   OT Plan: P:  Improve AAROM and PROM by 10 degrees for increased functinal use of left arm.    Goals Short Term Goals Time  to Complete Short Term Goals: 3 weeks Short Term Goal 1: Patient will be educated on HEP.  Short Term Goal 1 Progress: Progressing toward goal Short Term Goal 2: Patient will increase left shoulder PROM to [facility] for increased independence shaving her underarm. Short Term Goal 2 Progress: Progressing toward goal Short Term Goal 3: Patient will increase her left shoulder strength to 3/5 for increased ability to wash her hair. Short Term Goal 3 Progress: Progressing toward goal Short Term Goal 4: Patient will decrease pain in her left shoulder to 4/10 or better when sleeping. Short Term Goal 4 Progress: Progressing toward goal Short Term Goal 5: Patient will decrease fascial restrictions to minimal in her left shoulder region.    Short Term Goal 5 Progress: Progressing toward goal Long Term Goals Time to Complete Long Term Goals: 6 weeks Long Term Goal 1: Patient will return to highest level of independence with all daily, leisure, and work activities.  Long Term Goal 1 Progress: Progressing toward goal Long Term Goal 2: Patient will increase her left shoulder AROM to WNL for increased ability to reach out and close car door once inside.  Long Term Goal 2 Progress: Progressing toward goal Long Term Goal 3: Patient will increase her left shoulder strength to 4-/5 for increased ability to lift items above shoulder level.   Long Term Goal 3 Progress: Progressing toward goal Long Term Goal 4: Patient will decrease pain in her left shoulder to 2/10 or less during daily activities. Long Term Goal 4 Progress: Progressing toward goal Long Term Goal 5: Patient will decrease fascial restriction in her left shoulder to min. Long Term Goal 5 Progress: Progressing toward goal  Problem List Patient Active Problem List   Diagnosis Date Noted  . Pain in joint, shoulder region 10/12/2012  .  Muscle weakness (generalized) 10/12/2012  . Arthritis, shoulder region 10/06/2012  . Frozen shoulder syndrome  10/06/2012  . 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 Frozen shoulder 09/17/2012  . Bilateral leg weakness 12/04/2010    End of Session Activity Tolerance: Patient tolerated treatment well General Behavior During Therapy: [facility] for tasks assessed/performed  GO    Shirlean Mylar, OTR/L  11/01/2012, 11:13 AM

## 2012-11-03 ENCOUNTER — Ambulatory Visit (HOSPITAL_COMMUNITY): Payer: Self-pay | Admitting: Specialist

## 2012-11-05 ENCOUNTER — Ambulatory Visit (HOSPITAL_COMMUNITY): Payer: Self-pay | Admitting: Specialist

## 2012-11-05 ENCOUNTER — Telehealth (HOSPITAL_COMMUNITY): Payer: Self-pay

## 2012-11-08 ENCOUNTER — Ambulatory Visit (HOSPITAL_COMMUNITY)
Admission: RE | Admit: 2012-11-08 | Discharge: 2012-11-08 | Disposition: A | Discharge status: Home / Self Care | Payer: BC Managed Care – PPO | Source: Ambulatory Visit | Attending: Family Medicine | Admitting: Family Medicine

## 2012-11-10 ENCOUNTER — Ambulatory Visit (HOSPITAL_COMMUNITY)
Admission: RE | Admit: 2012-11-10 | Discharge: 2012-11-10 | Disposition: A | Discharge status: Home / Self Care | Payer: BC Managed Care – PPO | Source: Ambulatory Visit | Attending: Family Medicine | Admitting: Family Medicine

## 2012-11-10 NOTE — Progress Notes (Signed)
Occupational Therapy Treatment Patient Details  Name: Rokia Bosket MRN: [id number] Date of Birth: [birth year]  Today's Date: 11/08/2012 Time: 6962-9528 OT Time Calculation (min): 51 min Manual 1106-1126 (20') TherExercise 4132-4401 (31')  Visit#: 8 of 18  Re-eval: 11/09/12    Subjective Symptoms/Limitations Symptoms: S: I dont know if it is the weather or if i am trying to use it more but it is hurting more today  Pain Assessment Currently in Pain?: Yes Pain Score: 7  Pain Location: Shoulder Pain Orientation: Left Pain Type: Acute pain Multiple Pain Sites: No      Exercise/Treatments Supine Protraction: PROM;10 reps;AAROM;15 reps Horizontal ABduction: PROM;10 reps;AAROM;15 reps External Rotation: PROM;10 reps;AAROM;15 reps Internal Rotation: PROM;10 reps;AAROM;15 reps Flexion: PROM;10 reps;AAROM;15 reps ABduction: PROM;10 reps Seated Protraction: AAROM;15 reps Horizontal ABduction: AAROM;15 reps External Rotation: AAROM;15 reps Internal Rotation: AAROM;15 reps Flexion: AAROM;15 reps Abduction: AAROM;15 reps Pulleys Flexion: 2 minutes ABduction: 2 minutes Therapy Ball Flexion: 25 reps ABduction: 25 reps Right/Left: 5 reps ROM / Strengthening / Isometric Strengthening Thumb Tacks: 1' Flexion: 3X3" ABduction: 3X3"       Manual Therapy Manual Therapy: Myofascial release Myofascial Release: MFR and manual stretching to left upper arm, trapezius, and scapularis region to decrease fascial restrictions and increase joint mobility in a pain free zone  Occupational Therapy Assessment and Plan OT Assessment and Plan Clinical Impression Statement: A: Isometric strengthening in flexion and abduction this date. Continue to require cues for shoulder depression OT Plan: P: Reassessment.  Improve AAROM for increased functional use of left arm    Goals Short Term Goals Short Term Goal 1: Patient will be educated on HEP.  Short Term Goal 1 Progress: Progressing  toward goal Short Term Goal 2: Patient will increase left shoulder PROM to [facility] for increased independence shaving her underarm. Short Term Goal 2 Progress: Progressing toward goal Short Term Goal 3: Patient will increase her left shoulder strength to 3/5 for increased ability to wash her hair. Short Term Goal 3 Progress: Progressing toward goal Short Term Goal 4: Patient will decrease pain in her left shoulder to 4/10 or better when sleeping. Short Term Goal 4 Progress: Progressing toward goal Short Term Goal 5: Patient will decrease fascial restrictions to minimal in her left shoulder region.    Short Term Goal 5 Progress: Progressing toward goal Long Term Goals Long Term Goal 1: Patient will return to highest level of independence with all daily, leisure, and work activities.  Long Term Goal 1 Progress: Progressing toward goal Long Term Goal 2: Patient will increase her left shoulder AROM to WNL for increased ability to reach out and close car door once inside.  Long Term Goal 2 Progress: Progressing toward goal Long Term Goal 3: Patient will increase her left shoulder strength to 4-/5 for increased ability to lift items above shoulder level.   Long Term Goal 3 Progress: Progressing toward goal Long Term Goal 4: Patient will decrease pain in her left shoulder to 2/10 or less during daily activities. Long Term Goal 4 Progress: Progressing toward goal Long Term Goal 5: Patient will decrease fascial restriction in her left shoulder to min. Long Term Goal 5 Progress: Progressing toward goal  Problem List Patient Active Problem List   Diagnosis Date Noted  . Pain in joint, shoulder region 10/12/2012  . Muscle weakness (generalized) 10/12/2012  . Arthritis, shoulder region 10/06/2012  . 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 Frozen shoulder  09/17/2012  . Bilateral leg weakness 12/04/2010     End of Session Activity Tolerance: Patient tolerated treatment well General Behavior During Therapy: [facility] for tasks assessed/performed  GO    Velora Mediate, OTR/L  11/08/2012, 12:15PM

## 2012-11-10 NOTE — Progress Notes (Signed)
Occupational Therapy Treatment Patient Details  Name: [name] MRN: [id number] Date of Birth: Mar 11, 1970  Today's Date: 11/10/2012 Time: 1100-     Visit#: 8 of 18  Re-eval: 11/09/12 Assessment Diagnosis: Left Frozen Shoulder Next MD Visit: 11/18/12 Romeo Apple Prior Therapy: Outpatient therapy in [place] for right frozen shoulder  Authorization:    Authorization Time Period:    Authorization Visit#:   of    Subjective Symptoms/Limitations Symptoms: S: I dont know if it is the weather or if i am trying to use it more but it is hurting more today  Pain Assessment Currently in Pain?: Yes Pain Score: 2  Pain Location: Shoulder Pain Orientation: Left Pain Type: Acute pain Multiple Pain Sites: No  Precautions/Restrictions  Precautions Precautions: None  Exercise/Treatments Supine Protraction: PROM;10 reps;AAROM;15 reps Horizontal ABduction: PROM;10 reps;AAROM;15 reps External Rotation: PROM;10 reps;AAROM;15 reps Internal Rotation: PROM;10 reps;AAROM;15 reps Flexion: PROM;10 reps;AAROM;15 reps ABduction: PROM;10 reps Seated Protraction: AAROM;15 reps Horizontal ABduction: AAROM;15 reps External Rotation: AAROM;10 reps (; 5 sddl with MET) Internal Rotation: AAROM;10 reps (; 5 add'l with MET ) Flexion: AAROM;10 reps (; 5 add'l with MET ) Abduction: AAROM;10 reps (; 5 add'l with MET ) Prone    Sidelying   Standing   Pulleys   Therapy Ball Flexion: 25 reps ABduction: 25 reps Right/Left: 5 reps ROM / Strengthening / Isometric Strengthening   Flexion: 3X3" ABduction: 3X3" Stretches   Power Press photographer Therapy Manual Therapy: Myofascial release  Occupational Therapy Assessment and Plan OT Assessment and Plan Clinical Impression Statement: Minimal pain at rest, reports increased use and mobility since Monday.  Mild tightness in upper arm and joint.  Good response to MET with AAROM and PROM exercises.  Quick  fatigue with reps and timed activities.  Good to fair posture with exercises.   Pt will benefit from skilled therapeutic intervention in order to improve on the following deficits: Impaired UE functional use Rehab Potential: Good   Goals    Problem List Patient Active Problem List   Diagnosis Date Noted  . Pain in joint, shoulder region 10/12/2012  . Muscle weakness (generalized) 10/12/2012  . Arthritis, shoulder region 10/06/2012  . 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 Frozen shoulder 09/17/2012  . Bilateral leg weakness 12/04/2010    End of Session Activity Tolerance: Patient tolerated treatment well OT Plan of Care OT Home Exercise Plan: continue  poc  OT Patient Instructions: continue poc   GO    [facility] 11/10/2012, 11:36 AM

## 2012-11-12 ENCOUNTER — Ambulatory Visit (HOSPITAL_COMMUNITY): Payer: Self-pay

## 2012-11-15 ENCOUNTER — Ambulatory Visit (HOSPITAL_COMMUNITY): Payer: Self-pay | Admitting: Occupational Therapy

## 2012-11-17 ENCOUNTER — Ambulatory Visit (HOSPITAL_COMMUNITY): Payer: Self-pay | Admitting: Occupational Therapy

## 2012-11-18 ENCOUNTER — Encounter: Payer: Self-pay | Admitting: Orthopedic Surgery

## 2012-11-18 ENCOUNTER — Ambulatory Visit (INDEPENDENT_AMBULATORY_CARE_PROVIDER_SITE_OTHER): Payer: BC Managed Care – PPO | Admitting: Orthopedic Surgery

## 2012-11-18 VITALS — BP 114/80 | Ht 62.0 in | Wt 140.0 lb

## 2012-11-18 DIAGNOSIS — M7502 Adhesive capsulitis of left shoulder: Secondary | ICD-10-CM

## 2012-11-18 DIAGNOSIS — M7500 Adhesive capsulitis of unspecified shoulder: Secondary | ICD-10-CM

## 2012-11-18 NOTE — Progress Notes (Signed)
Patient ID: [name], female   DOB: 1970-09-20, 42 y.o.   MRN: [id number]  Chief Complaint  Patient presents with  . Follow-up    6 week recheck left shoulder s/p therapy   BP 114/80  Ht 5\' 2"  (1.575 m)  Wt 140 lb (63.504 kg)  BMI 25.6 kg/m2  Following frozen left shoulder today for elevation is approximately 120. External rotation 10. Patient indicates she is making progress. Therapy notes do not indicate measured range of motion.  Followup 6 weeks continue therapy. Diagnosis frozen left shoulder

## 2012-11-19 ENCOUNTER — Ambulatory Visit (HOSPITAL_COMMUNITY)
Admission: RE | Admit: 2012-11-19 | Discharge: 2012-11-19 | Disposition: A | Discharge status: Home / Self Care | Payer: BC Managed Care – PPO | Source: Ambulatory Visit | Attending: Family Medicine | Admitting: Family Medicine

## 2012-11-19 NOTE — Evaluation (Signed)
Occupational Therapy Re-Evaluation  Patient Details  Name: [name] MRN: [id number] Date of Birth: 29-May-1970  Today's Date: 11/19/2012 Time: 4098-1191 OT Time Calculation (min): 47 min Manual 4782-9562 (20') TherExercise 1308-6578 (17') Reassessment 1008-1018 (10')  Visit#: 9 of 18  Re-eval: 12/08/12  Assessment Diagnosis: Left Frozen Shoulder Next MD Visit: 11/18/12 Romeo Apple Prior Therapy: Outpatient therapy in [place] for right frozen shoulder   Past Medical History:  Past Medical History  Diagnosis Date  . Diabetes    Past Surgical History:  Past Surgical History  Procedure Laterality Date  . Caesarean      Subjective Symptoms/Limitations Symptoms: S:  I am not having a good diabetic morning, my blood sugar was in the 50's.   Patient Stated Goals: To be able to use arm normally.  Pain Assessment Currently in Pain?: Yes Pain Score: 6  Pain Location: Shoulder Pain Orientation: Left Pain Type: Acute pain Multiple Pain Sites: No  Precautions/Restrictions  Precautions Precautions: None   Assessment  Additional Assessments LUE AROM (degrees) Left Shoulder Flexion: 109 Degrees (92) Left Shoulder ABduction: 106 Degrees (90) Left Shoulder Internal Rotation: 82 Degrees (72) Left Shoulder External Rotation: 25 Degrees (16) LUE Strength Left Shoulder Flexion: 3-/5 Left Shoulder ABduction: 3-/5 Left Shoulder Internal Rotation: 3/5 Left Shoulder External Rotation: 2-/5 Palpation Palpation: Max fascial restrictions in left upper arm, trapezius, and scapularis region.      Exercise/Treatments Supine Protraction: PROM;10 reps;AAROM;15 reps Horizontal ABduction: PROM;10 reps;AAROM;15 reps External Rotation: PROM;10 reps;AAROM;15 reps Internal Rotation: PROM;10 reps;AAROM;15 reps Flexion: PROM;10 reps;AAROM;15 reps ABduction: PROM;10 reps Seated External Rotation: AAROM;10 reps Internal Rotation: AAROM;10 reps Flexion: AAROM;10 reps Abduction:  AAROM;10 reps   Pulleys Flexion: 3 minutes ABduction: 3 minutes   ROM / Strengthening / Isometric Strengthening Wall Wash: 1' Flexion: 3X3" ABduction: 3X3"     Manual Therapy Manual Therapy: Myofascial release Myofascial Release: MFR and manual stretching to left upper arm and scapularis region to decrease fascial restrictions and increase joint mobility in a pain free zone Weight Bearing Technique Weight Bearing Technique: No  Occupational Therapy Assessment and Plan OT Assessment and Plan Clinical Impression Statement: A: Reassessment completed this date.  Patient has been seen for 9 treatment sessions over a 5 week period for manual/MFR therapy, range of motion, strengthening, education and home program.  She arrives this date reporting she had an episode of low blood sugar at home.  Patient states she has been working on her HEP and having her husband range her arm while she has been unable to attend therapy for the last week. She is noted to have some gains in range and strength with her LUE. and  reports she is trying to incorporate it into as much daily activities as possible with improving sucess  ~30% of time.  She tolerated the incorporation of wall washes this date with increased complaint of fatigue.   OT Plan: P: Improve AAROM for functional use of Left arm for daily B/IADL, work and leisure tasks.    Goals Short Term Goals Short Term Goal 1: Patient will be educated on HEP.  Short Term Goal 1 Progress: Met Short Term Goal 2: Patient will increase left shoulder PROM to [facility] for increased independence shaving her underarm. Short Term Goal 2 Progress: Progressing toward goal Short Term Goal 3: Patient will increase her left shoulder strength to 3/5 for increased ability to wash her hair. Short Term Goal 3 Progress: Progressing toward goal Short Term Goal 4: Patient will decrease pain in her left  shoulder to 4/10 or better when sleeping. Short Term Goal 4 Progress: Met Short  Term Goal 5: Patient will decrease fascial restrictions to minimal in her left shoulder region.    Short Term Goal 5 Progress: Progressing toward goal Long Term Goals Long Term Goal 1: Patient will return to highest level of independence with all daily, leisure, and work activities.  Long Term Goal 1 Progress: Progressing toward goal Long Term Goal 2: Patient will increase her left shoulder AROM to WNL for increased ability to reach out and close car door once inside.  Long Term Goal 2 Progress: Progressing toward goal Long Term Goal 3: Patient will increase her left shoulder strength to 4-/5 for increased ability to lift items above shoulder level.   Long Term Goal 3 Progress: Progressing toward goal Long Term Goal 4: Patient will decrease pain in her left shoulder to 2/10 or less during daily activities. Long Term Goal 4 Progress: Progressing toward goal Long Term Goal 5: Patient will decrease fascial restriction in her left shoulder to min. Long Term Goal 5 Progress: Progressing toward goal  Problem List Patient Active Problem List   Diagnosis Date Noted  . Pain in joint, shoulder region 10/12/2012  . Muscle weakness (generalized) 10/12/2012  . Arthritis, shoulder region 10/06/2012  . 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 Frozen shoulder 09/17/2012  . Bilateral leg weakness 12/04/2010    End of Session Activity Tolerance: Patient tolerated treatment well General Behavior During Therapy: [facility] for tasks assessed/performed OT Plan of Care Consulted and Agree with Plan of Care: Patient  Velora Mediate, OTR/l  11/19/2012, 12:19 PM  Physician Documentation Your signature is required to indicate approval of the treatment plan as stated above.  Please sign and either send electronically or make a copy of this report for your files and return this physician signed original.  Please mark  one 1.__approve of plan  2. ___approve of plan with the following conditions.   ______________________________                                                          _____________________ Physician Signature                                                                                                             Date

## 2012-11-19 NOTE — Evaluation (Signed)
Occupational Therapy Re-Evaluation  Patient Details  Name: [name] MRN: [id number] Date of Birth: [birth year]  Today's Date: 11/19/2012 Time: 4098-1191 OT Time Calculation (min): 47 min Manual 4782-9562 (20') TherExercise 1308-6578 (17') Reassessment 1008-1018 (10')  Visit#: 9 of 18  Re-eval: 12/08/12  Assessment Diagnosis: Left Frozen Shoulder Next MD Visit: 11/18/12 Romeo Apple Prior Therapy: Outpatient therapy in [place] for right frozen shoulder   Past Medical History:  Past Medical History  Diagnosis Date  . Diabetes    Past Surgical History:  Past Surgical History  Procedure Laterality Date  . Caesarean      Subjective Symptoms/Limitations Symptoms: S:  I am not having a good diabetic morning, my blood sugar was in the 50's.   Patient Stated Goals: To be able to use arm normally.  Pain Assessment Currently in Pain?: Yes Pain Score: 6  Pain Location: Shoulder Pain Orientation: Left Pain Type: Acute pain Multiple Pain Sites: No  Precautions/Restrictions  Precautions Precautions: None   Assessment  Additional Assessments LUE AROM (degrees) Left Shoulder Flexion: 109 Degrees (92) Left Shoulder ABduction: 106 Degrees (90) Left Shoulder Internal Rotation: 82 Degrees (72) Left Shoulder External Rotation: 25 Degrees (16) LUE Strength Left Shoulder Flexion: 3-/5 Left Shoulder ABduction: 3-/5 Left Shoulder Internal Rotation: 3/5 Left Shoulder External Rotation: 2-/5 Palpation Palpation: Max fascial restrictions in left upper arm, trapezius, and scapularis region.      Exercise/Treatments Supine Protraction: PROM;10 reps;AAROM;15 reps Horizontal ABduction: PROM;10 reps;AAROM;15 reps External Rotation: PROM;10 reps;AAROM;15 reps Internal Rotation: PROM;10 reps;AAROM;15 reps Flexion: PROM;10 reps;AAROM;15 reps ABduction: PROM;10 reps Seated External Rotation: AAROM;10 reps Internal Rotation: AAROM;10 reps Flexion: AAROM;10 reps Abduction:  AAROM;10 reps   Pulleys Flexion: 3 minutes ABduction: 3 minutes   ROM / Strengthening / Isometric Strengthening Wall Wash: 1' Flexion: 3X3" ABduction: 3X3"     Manual Therapy Manual Therapy: Myofascial release Myofascial Release: MFR and manual stretching to left upper arm and scapularis region to decrease fascial restrictions and increase joint mobility in a pain free zone Weight Bearing Technique Weight Bearing Technique: No  Occupational Therapy Assessment and Plan OT Assessment and Plan Clinical Impression Statement: A: Reassessment completed this date.  Patient has been seen for 9 treatment sessions over a 5 week period for manual/MFR therapy, range of motion, strengthening, education and home program.  She arrives this date reporting she had an episode of low blood sugar at home.  Patient states she has been working on her HEP and having her husband range her arm while she has been unable to attend therapy for the last week. She is noted to have some gains in range and strength with her LUE. and  reports she is trying to incorporate it into as much daily activities as possible with improving sucess  ~30% of time.  She tolerated the incorporation of wall washes this date with increased complaint of fatigue.   OT Plan: P: Improve AAROM for functional use of Left arm for daily B/IADL, work and leisure tasks.    Goals Short Term Goals Short Term Goal 1: Patient will be educated on HEP.  Short Term Goal 1 Progress: Met Short Term Goal 2: Patient will increase left shoulder PROM to [facility] for increased independence shaving her underarm. Short Term Goal 2 Progress: Progressing toward goal Short Term Goal 3: Patient will increase her left shoulder strength to 3/5 for increased ability to wash her hair. Short Term Goal 3 Progress: Progressing toward goal Short Term Goal 4: Patient will decrease pain in her left  shoulder to 4/10 or better when sleeping. Short Term Goal 4 Progress: Met Short  Term Goal 5: Patient will decrease fascial restrictions to minimal in her left shoulder region.    Short Term Goal 5 Progress: Progressing toward goal Long Term Goals Long Term Goal 1: Patient will return to highest level of independence with all daily, leisure, and work activities.  Long Term Goal 1 Progress: Progressing toward goal Long Term Goal 2: Patient will increase her left shoulder AROM to WNL for increased ability to reach out and close car door once inside.  Long Term Goal 2 Progress: Progressing toward goal Long Term Goal 3: Patient will increase her left shoulder strength to 4-/5 for increased ability to lift items above shoulder level.   Long Term Goal 3 Progress: Progressing toward goal Long Term Goal 4: Patient will decrease pain in her left shoulder to 2/10 or less during daily activities. Long Term Goal 4 Progress: Progressing toward goal Long Term Goal 5: Patient will decrease fascial restriction in her left shoulder to min. Long Term Goal 5 Progress: Progressing toward goal  Problem List Patient Active Problem List   Diagnosis Date Noted  . Pain in joint, shoulder region 10/12/2012  . Muscle weakness (generalized) 10/12/2012  . Arthritis, shoulder region 10/06/2012  . 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 Frozen shoulder 09/17/2012  . Bilateral leg weakness 12/04/2010    End of Session Activity Tolerance: Patient tolerated treatment well General Behavior During Therapy: [facility] for tasks assessed/performed OT Plan of Care Consulted and Agree with Plan of Care: Patient  Velora Mediate, OTR/l  11/19/2012, 12:24 PM  Physician Documentation Your signature is required to indicate approval of the treatment plan as stated above.  Please sign and either send electronically or make a copy of this report for your files and return this physician signed original.  Please mark  one 1.__approve of plan  2. ___approve of plan with the following conditions.   ______________________________                                                          _____________________ Physician Signature                                                                                                             Date

## 2012-11-24 ENCOUNTER — Ambulatory Visit (HOSPITAL_COMMUNITY)
Admission: RE | Admit: 2012-11-24 | Discharge: 2012-11-24 | Disposition: A | Discharge status: Home / Self Care | Payer: BC Managed Care – PPO | Source: Ambulatory Visit | Attending: Family Medicine | Admitting: Family Medicine

## 2012-11-24 DIAGNOSIS — M7500 Adhesive capsulitis of unspecified shoulder: Secondary | ICD-10-CM | POA: Insufficient documentation

## 2012-11-24 DIAGNOSIS — M25619 Stiffness of unspecified shoulder, not elsewhere classified: Secondary | ICD-10-CM | POA: Insufficient documentation

## 2012-11-24 DIAGNOSIS — E119 Type 2 diabetes mellitus without complications: Secondary | ICD-10-CM | POA: Insufficient documentation

## 2012-11-24 DIAGNOSIS — IMO0001 Reserved for inherently not codable concepts without codable children: Secondary | ICD-10-CM | POA: Insufficient documentation

## 2012-11-24 DIAGNOSIS — M6281 Muscle weakness (generalized): Secondary | ICD-10-CM | POA: Insufficient documentation

## 2012-11-24 DIAGNOSIS — M25519 Pain in unspecified shoulder: Secondary | ICD-10-CM | POA: Insufficient documentation

## 2012-11-26 ENCOUNTER — Ambulatory Visit (HOSPITAL_COMMUNITY): Payer: Self-pay | Admitting: Occupational Therapy

## 2012-11-26 NOTE — Progress Notes (Signed)
Occupational Therapy Treatment Patient Details  Name: [name] MRN: [id number] Date of Birth: 01/25/1970  Today's Date: 11/24/2012 Time: 1350-1435 OT Time Calculation (min): 45 min Manual 1350-1410 (20') TherExercises 1410-1435 (25')  Visit#: 10 of 18  Re-eval: 12/08/12    Subjective Symptoms/Limitations Symptoms: S: I have been doing that thing with the wash cloth and it hurts! Pain Assessment Currently in Pain?: Yes Pain Score: 5  Pain Location: Shoulder Pain Orientation: Left Pain Type: Acute pain Multiple Pain Sites: No   Exercise/Treatments Supine Protraction: PROM;10 reps;AAROM;15 reps Horizontal ABduction: PROM;10 reps;AAROM;15 reps External Rotation: PROM;10 reps;AAROM;15 reps Internal Rotation: PROM;10 reps;AAROM;15 reps Flexion: PROM;10 reps;AAROM;15 reps ABduction: PROM;10 reps Seated External Rotation: AAROM;10 reps Internal Rotation: AAROM;10 reps Flexion: AAROM;10 reps Abduction: AAROM;10 reps    Sidelying External Rotation: AAROM;10 reps Flexion: AAROM;10 reps ABduction: AAROM;10 reps   Pulleys Flexion: 3 minutes ABduction: 3 minutes Therapy Ball Flexion: 25 reps ABduction: 25 reps ROM / Strengthening / Isometric Strengthening Wall Wash: 2' Flexion: 3X5" ABduction: 3X5"           Manual Therapy Manual Therapy: Myofascial release Myofascial Release: MFR and manual stretching to left upper arm and scapularis region to decrease fascial restrictions and increase joint mobility in a pain free zone  Occupational Therapy Assessment and Plan OT Assessment and Plan Clinical Impression Statement: A:  Patient states she is working on her HEP as she can however was unable to attend last session secondary to work schedue and feels as though her arm is tighter.  Tolerated exercises/stretches in sidelying this date with improved range.  OT Plan: P: Improve AAROM for functional use of Left arm for daily B/IADL, work and leisure tasks.     Goals Short Term Goals Short Term Goal 1: Patient will be educated on HEP.  Short Term Goal 1 Progress: Met Short Term Goal 2: Patient will increase left shoulder PROM to [facility] for increased independence shaving her underarm. Short Term Goal 2 Progress: Progressing toward goal Short Term Goal 3: Patient will increase her left shoulder strength to 3/5 for increased ability to wash her hair. Short Term Goal 3 Progress: Progressing toward goal Short Term Goal 4: Patient will decrease pain in her left shoulder to 4/10 or better when sleeping. Short Term Goal 4 Progress: Met Short Term Goal 5: Patient will decrease fascial restrictions to minimal in her left shoulder region.    Short Term Goal 5 Progress: Progressing toward goal Long Term Goals Long Term Goal 1: Patient will return to highest level of independence with all daily, leisure, and work activities.  Long Term Goal 1 Progress: Progressing toward goal Long Term Goal 2: Patient will increase her left shoulder AROM to WNL for increased ability to reach out and close car door once inside.  Long Term Goal 2 Progress: Progressing toward goal Long Term Goal 3: Patient will increase her left shoulder strength to 4-/5 for increased ability to lift items above shoulder level.   Long Term Goal 3 Progress: Progressing toward goal Long Term Goal 4: Patient will decrease pain in her left shoulder to 2/10 or less during daily activities. Long Term Goal 4 Progress: Progressing toward goal Long Term Goal 5: Patient will decrease fascial restriction in her left shoulder to min. Long Term Goal 5 Progress: Progressing toward goal  Problem List Patient Active Problem List   Diagnosis Date Noted  . Pain in joint, shoulder region 10/12/2012  . Muscle weakness (generalized) 10/12/2012  . Arthritis, shoulder region 10/06/2012  .  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 Type I (juvenile type) diabetes mellitus with unspecified complication, uncontrolled  09/17/2012  . Frozen shoulder 09/17/2012  . Bilateral leg weakness 12/04/2010    End of Session Activity Tolerance: Treatment limited secondary to medical complications (Comment) General Behavior During Therapy: [facility] for tasks assessed/performed   Velora Mediate,  OTR/L  11/24/2012, 4:01PM

## 2012-11-29 ENCOUNTER — Ambulatory Visit (HOSPITAL_COMMUNITY)
Admission: RE | Admit: 2012-11-29 | Discharge: 2012-11-29 | Disposition: A | Discharge status: Home / Self Care | Payer: BC Managed Care – PPO | Source: Ambulatory Visit | Attending: Family Medicine | Admitting: Family Medicine

## 2012-11-29 NOTE — Progress Notes (Signed)
Occupational Therapy Treatment Patient Details  Name: [name] MRN: [id number] Date of Birth: March 27, 1970  Today's Date: 11/29/2012 Time: 4098-1191 OT Time Calculation (min): 43 min Manual [id number] (14') TherExercises 0950-1019 (29')  Visit#: 11 of 18  Re-eval: 12/08/12    Subjective Symptoms/Limitations Symptoms: S: It stayed loose for a day or two and then i could feel it tighten up again  Pain Assessment Pain Score: 3  Pain Location: Shoulder Pain Orientation: Left Pain Type: Acute pain Multiple Pain Sites: No      Exercise/Treatments Supine Protraction: AAROM;10 reps;AROM;Strengthening;12 reps;Weights Protraction Weight (lbs): 2 Horizontal ABduction: AAROM;10 reps;AROM;Strengthening;12 reps;Weights Horizontal ABduction Weight (lbs): 2 External Rotation: AAROM;10 reps;AROM;Strengthening;12 reps;Weights External Rotation Weight (lbs): 2 Internal Rotation: AAROM;10 reps;AROM;Strengthening;12 reps;Weights Internal Rotation Weight (lbs): 2 Flexion: AAROM;10 reps;AROM;Strengthening;12 reps;Weights Shoulder Flexion Weight (lbs): 2 ABduction: AAROM;10 reps;AROM;Strengthening;12 reps;Weights Shoulder ABduction Weight (lbs): 2 Seated External Rotation: AROM;12 reps Internal Rotation: AROM;12 reps Flexion: AROM;10 reps Abduction: AROM;10 reps Prone  Retraction: AAROM;5 reps (x 30" each  "airplane stretch ) Flexion: AAROM;5 reps (weightbearing stretch x 30" each "superman stretch") Extension: AROM;10 reps (30 second hold each ) Sidelying External Rotation: AAROM;10 reps Flexion: AAROM;10 reps ABduction: AAROM;10 reps ROM / Strengthening / Isometric Strengthening   Flexion: 3X5" ABduction: 3X5" Stretches Internal Rotation Stretch: 5 reps (x10 second hold each ) External Rotation Stretch: 5 reps (x 10 second hold each )    Manual Therapy Manual Therapy: Myofascial release Myofascial Release: MFR and manual stretching to left upper arm and scapularis  region to decrease fascial restrictions and increase joint mobility in a pain free zone  Occupational Therapy Assessment and Plan OT Assessment and Plan Clinical Impression Statement: A:  Placed patient in prone this date for flexion and abduction stretches with moderate difficulties however noted improved range following.  Continues with increased complaints of tightness in shoulder even with HEP completion  OT Plan: P: Improve AAROM for functional use of Left arm for daily B/IADL, work and leisure tasks.    Goals Short Term Goals Short Term Goal 1: Patient will be educated on HEP.  Short Term Goal 1 Progress: Met Short Term Goal 2: Patient will increase left shoulder PROM to [facility] for increased independence shaving her underarm. Short Term Goal 2 Progress: Progressing toward goal Short Term Goal 3: Patient will increase her left shoulder strength to 3/5 for increased ability to wash her hair. Short Term Goal 3 Progress: Progressing toward goal Short Term Goal 4: Patient will decrease pain in her left shoulder to 4/10 or better when sleeping. Short Term Goal 4 Progress: Met Short Term Goal 5: Patient will decrease fascial restrictions to minimal in her left shoulder region.    Short Term Goal 5 Progress: Progressing toward goal Long Term Goals Long Term Goal 1: Patient will return to highest level of independence with all daily, leisure, and work activities.  Long Term Goal 1 Progress: Progressing toward goal Long Term Goal 2: Patient will increase her left shoulder AROM to WNL for increased ability to reach out and close car door once inside.  Long Term Goal 2 Progress: Progressing toward goal Long Term Goal 3: Patient will increase her left shoulder strength to 4-/5 for increased ability to lift items above shoulder level.   Long Term Goal 3 Progress: Progressing toward goal Long Term Goal 4: Patient will decrease pain in her left shoulder to 2/10 or less during daily activities. Long  Term Goal 4 Progress: Progressing toward goal Long Term Goal 5: Patient  will decrease fascial restriction in her left shoulder to min. Long Term Goal 5 Progress: Progressing toward goal  Problem List Patient Active Problem List   Diagnosis Date Noted  . Pain in joint, shoulder region 10/12/2012  . Muscle weakness (generalized) 10/12/2012  . Arthritis, shoulder region 10/06/2012  . 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 Frozen shoulder 09/17/2012  . Bilateral leg weakness 12/04/2010    End of Session Activity Tolerance: Patient tolerated treatment well General Behavior During Therapy: [facility] for tasks assessed/performed   Velora Mediate, OTR/L  11/29/2012, 12:50 PM

## 2012-12-01 ENCOUNTER — Ambulatory Visit (HOSPITAL_COMMUNITY)
Admission: RE | Admit: 2012-12-01 | Discharge: 2012-12-01 | Disposition: A | Discharge status: Home / Self Care | Payer: BC Managed Care – PPO | Source: Ambulatory Visit | Attending: Family Medicine | Admitting: Family Medicine

## 2012-12-01 NOTE — Progress Notes (Signed)
Occupational Therapy Treatment Patient Details  Name: [name] MRN: [id number] Date of Birth: March 11, 1970  Today's Date: 12/01/2012 Time: 7829-5621 OT Time Calculation (min): 44 min Manual 0932-0950 (18) TherExercises 0950-1016 (26')  Visit#: 12 of 18  Re-eval: 12/08/12    Subjective Symptoms/Limitations Symptoms: S: I have been trying those new exercises at home, i think they are making it feel looser.   Pain Assessment Currently in Pain?: Yes Pain Score: 3  Pain Location: Shoulder Pain Orientation: Left Pain Type: Acute pain Multiple Pain Sites: No   Exercise/Treatments Supine Protraction: AAROM;10 reps;AROM;Strengthening;12 reps Horizontal ABduction: AAROM;10 reps;AROM;Strengthening;12 reps External Rotation: AAROM;10 reps;AROM;Strengthening;12 reps Internal Rotation: AAROM;10 reps;AROM;Strengthening;12 reps Flexion: AAROM;10 reps;AROM;Strengthening;12 reps ABduction: AAROM;10 reps;AROM;Strengthening;12 reps Other Supine Exercises: overhead stretch with 4# dumbell weight x 5 x 30" each    Prone  Retraction: AAROM;5 reps (x 45" each airplane stretch ) Flexion: AAROM;5 reps (x 45 seconds each weightbearing stretch )   Standing External Rotation: AROM;10 reps;Theraband Theraband Level (Shoulder External Rotation): Level 2 (Red) Internal Rotation: AROM;10 reps;Theraband Theraband Level (Shoulder Internal Rotation): Level 2 (Red) Extension: AROM;10 reps;Theraband Theraband Level (Shoulder Extension): Level 2 (Red) Row: AROM;10 reps;Theraband Theraband Level (Shoulder Row): Level 2 (Red)   ROM / Strengthening / Isometric Strengthening   Flexion: 5X5" ABduction: 5X5"            Manual Therapy Manual Therapy: Myofascial release Myofascial Release: MFR and manual stretching to left upper arm and scapularis region to decrease fascial restrictions and increase joint mobility in a pain free zone  Occupational Therapy Assessment and Plan OT Assessment  and Plan Clinical Impression Statement: A: Patient reports good follow through with prone stretches at home. Initiated theraband exercises this date with good tolerance.  Patient reports shoulder feels looser following treatment this date with complaint of pain 1/10. OT Plan: P: Improve AAROM for functional use of Left arm for daily B/IADL, work and leisure tasks.    Goals Short Term Goals Short Term Goal 1: Patient will be educated on HEP.  Short Term Goal 1 Progress: Met Short Term Goal 2: Patient will increase left shoulder PROM to [facility] for increased independence shaving her underarm. Short Term Goal 2 Progress: Progressing toward goal Short Term Goal 3: Patient will increase her left shoulder strength to 3/5 for increased ability to wash her hair. Short Term Goal 3 Progress: Progressing toward goal Short Term Goal 4: Patient will decrease pain in her left shoulder to 4/10 or better when sleeping. Short Term Goal 4 Progress: Met Short Term Goal 5: Patient will decrease fascial restrictions to minimal in her left shoulder region.    Short Term Goal 5 Progress: Progressing toward goal Long Term Goals Long Term Goal 1: Patient will return to highest level of independence with all daily, leisure, and work activities.  Long Term Goal 1 Progress: Progressing toward goal Long Term Goal 2: Patient will increase her left shoulder AROM to WNL for increased ability to reach out and close car door once inside.  Long Term Goal 2 Progress: Progressing toward goal Long Term Goal 3: Patient will increase her left shoulder strength to 4-/5 for increased ability to lift items above shoulder level.   Long Term Goal 3 Progress: Progressing toward goal Long Term Goal 4: Patient will decrease pain in her left shoulder to 2/10 or less during daily activities. Long Term Goal 4 Progress: Progressing toward goal Long Term Goal 5: Patient will decrease fascial restriction in her left shoulder to min. Long Term  Goal  5 Progress: Progressing toward goal  Problem List Patient Active Problem List   Diagnosis Date Noted  . Pain in joint, shoulder region 10/12/2012  . Muscle weakness (generalized) 10/12/2012  . Arthritis, shoulder region 10/06/2012  . 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 Frozen shoulder 09/17/2012  . Bilateral leg weakness 12/04/2010    End of Session Activity Tolerance: Patient tolerated treatment well General Behavior During Therapy: [facility] for tasks assessed/performed  GO    Velora Mediate, OTR/L  12/01/2012, 1:03 PM

## 2012-12-03 ENCOUNTER — Ambulatory Visit (HOSPITAL_COMMUNITY)
Admission: RE | Admit: 2012-12-03 | Discharge: 2012-12-03 | Disposition: A | Discharge status: Home / Self Care | Payer: BC Managed Care – PPO | Source: Ambulatory Visit | Attending: Family Medicine | Admitting: Family Medicine

## 2012-12-03 NOTE — Progress Notes (Signed)
Occupational Therapy Treatment Patient Details  Name: [name] MRN: [id number] Date of Birth: 01-17-1971  Today's Date: 12/03/2012 Time: 4098-1191 OT Time Calculation (min): 46 min Manual Therapy [phone] 25' Therapeutic exercises 1003-1024 21' Visit#: 13 of 18  Re-eval: 12/08/12     Subjective  S:  I think the weight stretch into flexion is really helping loosen me up. Pain Assessment Currently in Pain?: Yes Pain Score: 3   Precautions/Restrictions    progress as tolerated  Exercise/Treatments Supine Protraction: PROM;AAROM;10 reps Horizontal ABduction: PROM;AAROM;10 reps External Rotation: PROM;AAROM;10 reps Internal Rotation: PROM;AAROM;10 reps Flexion: PROM;AAROM;10 reps ABduction: PROM;AAROM;10 reps Other Supine Exercises: overhead stretch with 5# x 1' Standing External Rotation: Theraband;15 reps Theraband Level (Shoulder External Rotation): Level 2 (Red) Internal Rotation: Theraband;15 reps Theraband Level (Shoulder Internal Rotation): Level 2 (Red) Extension: Theraband;15 reps Theraband Level (Shoulder Extension): Level 2 (Red) Row: Theraband;15 reps Theraband Level (Shoulder Row): Level 2 (Red) ROM / Strengthening / Isometric Strengthening UBE (Upper Arm Bike): 3' and 3' 1.0        Manual Therapy Manual Therapy: Myofascial release Myofascial Release: MFR and manual stretching to left upper arm and scapularis region to decrease fascial restrictions and increase joint mobility in a pain free zone  Occupational Therapy Assessment and Plan OT Assessment and Plan Clinical Impression Statement: A:  Good form with theraband scapular stability exercises.  Added UBE for improving shoulder mobility.  OT Plan: P:  Reassess.   Goals Short Term Goals Short Term Goal 1: Patient will be educated on HEP.  Short Term Goal 2: Patient will increase left shoulder PROM to [facility] for increased independence shaving her underarm. Short Term Goal 3: Patient will  increase her left shoulder strength to 3/5 for increased ability to wash her hair. Short Term Goal 4: Patient will decrease pain in her left shoulder to 4/10 or better when sleeping. Short Term Goal 5: Patient will decrease fascial restrictions to minimal in her left shoulder region.    Long Term Goals Long Term Goal 1: Patient will return to highest level of independence with all daily, leisure, and work activities.  Long Term Goal 2: Patient will increase her left shoulder AROM to WNL for increased ability to reach out and close car door once inside.  Long Term Goal 3: Patient will increase her left shoulder strength to 4-/5 for increased ability to lift items above shoulder level.   Long Term Goal 4: Patient will decrease pain in her left shoulder to 2/10 or less during daily activities. Long Term Goal 5: Patient will decrease fascial restriction in her left shoulder to min.  Problem List Patient Active Problem List   Diagnosis Date Noted  . Pain in joint, shoulder region 10/12/2012  . Muscle weakness (generalized) 10/12/2012  . Arthritis, shoulder region 10/06/2012  . 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 Frozen shoulder 09/17/2012  . Bilateral leg weakness 12/04/2010    End of Session Activity Tolerance: Patient tolerated treatment well  GO    Shirlean Mylar, OTR/L  12/03/2012, 10:21 AM

## 2012-12-06 ENCOUNTER — Ambulatory Visit (HOSPITAL_COMMUNITY)
Admission: RE | Admit: 2012-12-06 | Discharge: 2012-12-06 | Disposition: A | Discharge status: Home / Self Care | Payer: BC Managed Care – PPO | Source: Ambulatory Visit | Attending: Family Medicine | Admitting: Family Medicine

## 2012-12-06 NOTE — Progress Notes (Signed)
Occupational Therapy Treatment Patient Details  Name: [name] MRN: [id number] Date of Birth: Apr 09, 1970  Today's Date: 12/06/2012 Time: 7829-5621 OT Time Calculation (min): 49 min Manual Therapy [id number] 15' Therapeutic exercises [id number] 34' Visit#: 14 of 18  Re-eval: 12/17/12 Assessment Diagnosis: Left Frozen Shoulder  Subjective S:  My shoulder hasnt been hurting, I have been dealing with my diabetes.   Pain Assessment Currently in Pain?: No/denies Pain Score: 0-No pain  Precautions/Restrictions   progress as tolerated  Exercise/Treatments Supine Protraction: PROM;10 reps;AAROM;15 reps Horizontal ABduction: PROM;10 reps;AAROM;15 reps External Rotation: PROM;10 reps;AAROM;15 reps Internal Rotation: PROM;10 reps;AAROM;15 reps Flexion: PROM;10 reps;AAROM;15 reps ABduction: PROM;10 reps;AAROM;15 reps Other Supine Exercises: overhead stretch 5# 1' X 2  Other Supine Exercises: serratus anterior punch 15 times Standing External Rotation: Theraband;15 reps Theraband Level (Shoulder External Rotation): Level 2 (Red) Internal Rotation: Theraband;15 reps Theraband Level (Shoulder Internal Rotation): Level 2 (Red) Extension: Theraband;15 reps Theraband Level (Shoulder Extension): Level 2 (Red) Row: Theraband;15 reps Theraband Level (Shoulder Row): Level 2 (Red) ROM / Strengthening / Isometric Strengthening UBE (Upper Arm Bike): 3' and 3' 1.5 Wall Wash: 2' Proximal Shoulder Strengthening, Seated: 10 times each without resting       Manual Therapy Manual Therapy: Myofascial release Myofascial Release: MFR and manual stretching to left upper arm and scapularis region to decrease fascial restrictions and increase joint mobility in a pain free zone  Occupational Therapy Assessment and Plan OT Assessment and Plan Clinical Impression Statement: A:  Added serratus anterior punch and proximal shoulder strengthening in supine for increased scapular stability. Reassess not  completed this date, as it is not due unitl next Friday. OT Plan: P:  Improve AAROM and PROM by 10 degrees for increased independence with functional reaching overhead.   Goals Short Term Goals Short Term Goal 1: Patient will be educated on HEP.  Short Term Goal 2: Patient will increase left shoulder PROM to [facility] for increased independence shaving her underarm. Short Term Goal 2 Progress: Progressing toward goal Short Term Goal 3: Patient will increase her left shoulder strength to 3/5 for increased ability to wash her hair. Short Term Goal 3 Progress: Progressing toward goal Short Term Goal 4: Patient will decrease pain in her left shoulder to 4/10 or better when sleeping. Short Term Goal 5: Patient will decrease fascial restrictions to minimal in her left shoulder region.    Short Term Goal 5 Progress: Progressing toward goal Long Term Goals Long Term Goal 1: Patient will return to highest level of independence with all daily, leisure, and work activities.  Long Term Goal 1 Progress: Progressing toward goal Long Term Goal 2: Patient will increase her left shoulder AROM to WNL for increased ability to reach out and close car door once inside.  Long Term Goal 2 Progress: Progressing toward goal Long Term Goal 3: Patient will increase her left shoulder strength to 4-/5 for increased ability to lift items above shoulder level.   Long Term Goal 3 Progress: Progressing toward goal Long Term Goal 4: Patient will decrease pain in her left shoulder to 2/10 or less during daily activities. Long Term Goal 4 Progress: Progressing toward goal Long Term Goal 5: Patient will decrease fascial restriction in her left shoulder to min. Long Term Goal 5 Progress: Progressing toward goal  Problem List Patient Active Problem List   Diagnosis Date Noted  . Pain in joint, shoulder region 10/12/2012  . Muscle weakness (generalized) 10/12/2012  . Arthritis, shoulder region 10/06/2012  . Frozen shoulder  syndrome 10/06/2012  . 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 Frozen shoulder 09/17/2012  . Bilateral leg weakness 12/04/2010    End of Session Activity Tolerance: Patient tolerated treatment well General Behavior During Therapy: [facility] for tasks assessed/performed  GO    Shirlean Mylar, OTR/L  12/06/2012, 10:17 AM

## 2012-12-08 ENCOUNTER — Ambulatory Visit (HOSPITAL_COMMUNITY)
Admission: RE | Admit: 2012-12-08 | Discharge: 2012-12-08 | Disposition: A | Discharge status: Home / Self Care | Payer: BC Managed Care – PPO | Source: Ambulatory Visit | Attending: Family Medicine | Admitting: Family Medicine

## 2012-12-08 NOTE — Progress Notes (Signed)
Occupational Therapy Treatment Patient Details  Name: [name] MRN: [id number] Date of Birth: 1970-09-01  Today's Date: 12/08/2012 Time: 1478-2956 OT Time Calculation (min): 43 min Manual [id number] (15') TherExercises 8469-6295 (28') Visit#: 15 of 18  Re-eval: 12/17/12     Subjective Symptoms/Limitations Symptoms: S:  I have been doing my exercises on my belly and the one holding over my head Pain Assessment Currently in Pain?: No/denies Multiple Pain Sites: No   Exercise/Treatments Supine Protraction: PROM;10 reps;AAROM;15 reps Horizontal ABduction: PROM;10 reps;AAROM;15 reps External Rotation: PROM;10 reps;AAROM;15 reps Internal Rotation: PROM;10 reps;AAROM;15 reps Flexion: PROM;10 reps;AAROM;15 reps Other Supine Exercises: overhead stretch 5# 1' X 2  Other Supine Exercises: serratus anterior punch 15 times   Standing External Rotation: Theraband;15 reps Theraband Level (Shoulder External Rotation): Level 2 (Red) Internal Rotation: Theraband;15 reps Theraband Level (Shoulder Internal Rotation): Level 2 (Red) Extension: Theraband;15 reps Theraband Level (Shoulder Extension): Level 2 (Red) Row: Theraband;15 reps Theraband Level (Shoulder Row): Level 2 (Red)   ROM / Strengthening / Isometric Strengthening UBE (Upper Arm Bike): 3' and 3' 2.0 Wall Wash: 2'       Manual Therapy Manual Therapy: Myofascial release Myofascial Release: MFR and manual stretching to left upper arm and scapularis region to decrease fascial restrictions and increase joint mobility in a pain free zone Weight Bearing Technique Weight Bearing Technique: No  Occupational Therapy Assessment and Plan OT Assessment and Plan Clinical Impression Statement: A: Reports improving tolerance for overhead weighted stretch. Patient reports increased 'cracking' losening of shoulder with use of UBE. Reports good follow through with prone exercises at home.   OT Plan: P:  Improve AAROM and PROM by  10 degrees for increased independence with functional reaching overhead.   Goals Short Term Goals Short Term Goal 1: Patient will be educated on HEP.  Short Term Goal 1 Progress: Met Short Term Goal 2: Patient will increase left shoulder PROM to [facility] for increased independence shaving her underarm. Short Term Goal 2 Progress: Progressing toward goal Short Term Goal 3: Patient will increase her left shoulder strength to 3/5 for increased ability to wash her hair. Short Term Goal 3 Progress: Progressing toward goal Short Term Goal 4: Patient will decrease pain in her left shoulder to 4/10 or better when sleeping. Short Term Goal 4 Progress: Met Short Term Goal 5: Patient will decrease fascial restrictions to minimal in her left shoulder region.    Short Term Goal 5 Progress: Progressing toward goal Long Term Goals Long Term Goal 1: Patient will return to highest level of independence with all daily, leisure, and work activities.  Long Term Goal 1 Progress: Progressing toward goal Long Term Goal 2: Patient will increase her left shoulder AROM to WNL for increased ability to reach out and close car door once inside.  Long Term Goal 2 Progress: Progressing toward goal Long Term Goal 3: Patient will increase her left shoulder strength to 4-/5 for increased ability to lift items above shoulder level.   Long Term Goal 3 Progress: Progressing toward goal Long Term Goal 4: Patient will decrease pain in her left shoulder to 2/10 or less during daily activities. Long Term Goal 4 Progress: Progressing toward goal Long Term Goal 5: Patient will decrease fascial restriction in her left shoulder to min. Long Term Goal 5 Progress: Progressing toward goal  Problem List Patient Active Problem List   Diagnosis Date Noted  . Pain in joint, shoulder region 10/12/2012  . Muscle weakness (generalized) 10/12/2012  . Arthritis, shoulder region 10/06/2012  .  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 Type I  (juvenile type) diabetes mellitus with unspecified complication, uncontrolled 09/17/2012  . Frozen shoulder 09/17/2012  . Bilateral leg weakness 12/04/2010    End of Session Activity Tolerance: Patient tolerated treatment well General Behavior During Therapy: [facility] for tasks assessed/performed  GO    Velora Mediate, OTR/L 12/08/2012, 10:20 AM

## 2012-12-10 ENCOUNTER — Ambulatory Visit (HOSPITAL_COMMUNITY)
Admission: RE | Admit: 2012-12-10 | Discharge: 2012-12-10 | Disposition: A | Discharge status: Home / Self Care | Payer: BC Managed Care – PPO | Source: Ambulatory Visit | Attending: Family Medicine | Admitting: Family Medicine

## 2012-12-10 NOTE — Progress Notes (Signed)
Occupational Therapy Treatment Patient Details  Name: [name] MRN: [id number] Date of Birth: 09-22-1970  Today's Date: 12/10/2012 Time: 6962-9528 OT Time Calculation (min): 42 min Manual [id number] (12') TherExercises [phone]')  Visit#: 16 of 18  Re-eval: 12/17/12    Subjective Symptoms/Limitations Symptoms: S: I feel like I am doing more and it doesnt hurt as much.  Pain Assessment Currently in Pain?: No/denies    Exercise/Treatments Supine Protraction: PROM;10 reps;AAROM;15 reps Horizontal ABduction: PROM;10 reps;AAROM;15 reps External Rotation: PROM;10 reps;AAROM;15 reps Internal Rotation: PROM;10 reps;AAROM;15 reps Flexion: PROM;10 reps;AAROM;15 reps ABduction: PROM;10 reps;AAROM;15 reps Other Supine Exercises: serratus anterior punch 15 times tanding External Rotation: Theraband;15 reps Theraband Level (Shoulder External Rotation): Level 2 (Red) Internal Rotation: Theraband;15 reps Theraband Level (Shoulder Internal Rotation): Level 2 (Red) Extension: Theraband;15 reps Theraband Level (Shoulder Extension): Level 2 (Red) Row: Theraband;15 reps Theraband Level (Shoulder Row): Level 2 (Red)   ROM / Strengthening / Isometric Strengthening UBE (Upper Arm Bike): 3' and 3' level 3.0   Stretches [employer], [employer]: 5 reps;30 seconds Other Shoulder Stretches: doorway stretch with arm flexed x 5 x 15" hold each          Manual Therapy Manual Therapy: Myofascial release Myofascial Release: MFR and manual stretching to left upper arm and scapularis region to decrease fascial restrictions and increase joint mobility in a pain free zone  Occupational Therapy Assessment and Plan OT Assessment and Plan Clinical Impression Statement: A:  Patient tolerated doorway and corner stretches well this date wtih increased complaint of pain during stretch and reported/noted popping following stretches.  Reports increased ease reaching back of head to wash hair and able  to reach up to hug husband however cont with decreased ability to raise arm.   OT Plan: P:  Improve AAROM and PROM by 10 degrees for increased independence with functional reaching overhead.   Goals Short Term Goals Short Term Goal 1: Patient will be educated on HEP.  Short Term Goal 1 Progress: Met Short Term Goal 2: Patient will increase left shoulder PROM to [facility] for increased independence shaving her underarm. Short Term Goal 2 Progress: Progressing toward goal Short Term Goal 3: Patient will increase her left shoulder strength to 3/5 for increased ability to wash her hair. Short Term Goal 3 Progress: Progressing toward goal Short Term Goal 4: Patient will decrease pain in her left shoulder to 4/10 or better when sleeping. Short Term Goal 4 Progress: Met Short Term Goal 5: Patient will decrease fascial restrictions to minimal in her left shoulder region.    Short Term Goal 5 Progress: Progressing toward goal Long Term Goals Long Term Goal 1: Patient will return to highest level of independence with all daily, leisure, and work activities.  Long Term Goal 1 Progress: Progressing toward goal Long Term Goal 2: Patient will increase her left shoulder AROM to WNL for increased ability to reach out and close car door once inside.  Long Term Goal 2 Progress: Progressing toward goal Long Term Goal 3: Patient will increase her left shoulder strength to 4-/5 for increased ability to lift items above shoulder level.   Long Term Goal 3 Progress: Progressing toward goal Long Term Goal 4: Patient will decrease pain in her left shoulder to 2/10 or less during daily activities. Long Term Goal 4 Progress: Progressing toward goal Long Term Goal 5: Patient will decrease fascial restriction in her left shoulder to min. Long Term Goal 5 Progress: Progressing toward goal  Problem List Patient Active Problem List  Diagnosis Date Noted  . Pain in joint, shoulder region 10/12/2012  . Muscle weakness  (generalized) 10/12/2012  . Arthritis, shoulder region 10/06/2012  . 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 Frozen shoulder 09/17/2012  . Bilateral leg weakness 12/04/2010    End of Session Activity Tolerance: Patient tolerated treatment well General Behavior During Therapy: [facility] for tasks assessed/performed  GO   Velora Mediate, OTr/L  12/10/2012, 10:19 AM

## 2012-12-13 ENCOUNTER — Ambulatory Visit (HOSPITAL_COMMUNITY)
Admission: RE | Admit: 2012-12-13 | Discharge: 2012-12-13 | Disposition: A | Discharge status: Home / Self Care | Payer: BC Managed Care – PPO | Source: Ambulatory Visit | Attending: Family Medicine | Admitting: Family Medicine

## 2012-12-13 NOTE — Progress Notes (Signed)
Occupational Therapy Treatment Patient Details  Name: [name] MRN: [id number] Date of Birth: 1970-09-20  Today's Date: 12/13/2012 Time: 4098-1191 OT Time Calculation (min): 41 min Manual Therapy [phone] 16' Therapeutic exercises [id number] 25'  Visit#: 17 of 18  Re-eval: 12/17/12    Subjective  S;  I had a really painful day on Saturday.  It felt tight like it needed to pop. Pain Assessment Currently in Pain?: Yes Pain Score: 2  Pain Location: Shoulder Pain Orientation: Left Pain Type: Acute pain  Exercise/Treatments Supine Protraction: PROM;10 reps Horizontal ABduction: PROM;10 reps External Rotation: PROM;10 reps Internal Rotation: PROM;10 reps Flexion: PROM;10 reps ABduction: PROM;10 reps Other Supine Exercises: overhead stretch 5# 2' X 2 Other Supine Exercises: serratus anterior punch 15 times Right/Left: 5 reps (with small green ball behind back) ROM / Strengthening / Isometric Strengthening UBE (Upper Arm Bike): 3' and 3' level 1.5 Wall Wash: 3' Wall Pushups: 15 reps      Manual Therapy Manual Therapy: Myofascial release Myofascial Release: MFR and manual stretching to left upper arm and scapularis region to decrease fascial restrictions and increase joint mobility in a pain free zone  Occupational Therapy Assessment and Plan OT Assessment and Plan Clinical Impression Statement: A:  Added wall pushup and focused on flexion and abduction stretch while wall washing this date. OT Plan: P:  Reassess for 30 day progress note.    Goals Short Term Goals Short Term Goal 1: Patient will be educated on HEP.  Short Term Goal 2: Patient will increase left shoulder PROM to [facility] for increased independence shaving her underarm. Short Term Goal 3: Patient will increase her left shoulder strength to 3/5 for increased ability to wash her hair. Short Term Goal 4: Patient will decrease pain in her left shoulder to 4/10 or better when sleeping. Short Term Goal 5: Patient  will decrease fascial restrictions to minimal in her left shoulder region.    Long Term Goals Long Term Goal 1: Patient will return to highest level of independence with all daily, leisure, and work activities.  Long Term Goal 2: Patient will increase her left shoulder AROM to WNL for increased ability to reach out and close car door once inside.  Long Term Goal 3: Patient will increase her left shoulder strength to 4-/5 for increased ability to lift items above shoulder level.   Long Term Goal 4: Patient will decrease pain in her left shoulder to 2/10 or less during daily activities. Long Term Goal 5: Patient will decrease fascial restriction in her left shoulder to min.  Problem List Patient Active Problem List   Diagnosis Date Noted  . Pain in joint, shoulder region 10/12/2012  . Muscle weakness (generalized) 10/12/2012  . Arthritis, shoulder region 10/06/2012  . 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 Frozen shoulder 09/17/2012  . Bilateral leg weakness 12/04/2010    End of Session Activity Tolerance: Patient tolerated treatment well General Behavior During Therapy: [facility] for tasks assessed/performed  GO    Shirlean Mylar, OTR/L  12/13/2012, 10:16 AM

## 2012-12-15 ENCOUNTER — Ambulatory Visit (HOSPITAL_COMMUNITY)
Admission: RE | Admit: 2012-12-15 | Discharge: 2012-12-15 | Disposition: A | Discharge status: Home / Self Care | Payer: BC Managed Care – PPO | Source: Ambulatory Visit | Attending: Family Medicine | Admitting: Family Medicine

## 2012-12-15 NOTE — Evaluation (Signed)
Occupational Therapy Re-Evaluation  Patient Details  Name: [name] MRN: [id number] Date of Birth: 24-Feb-1970  Today's Date: 12/15/2012 Time: 0940-1010 OT Time Calculation (min): 30 min Manual Therapy [phone] 11' Reassess ROM [phone] 19' Visit#: 18 of   18 Re-eval: 01/12/13  Assessment Diagnosis: Left Frozen Shoulder    Past Medical History:  Past Medical History  Diagnosis Date  . Diabetes    Past Surgical History:  Past Surgical History  Procedure Laterality Date  . Caesarean      Subjective S:  Its not hurting at all today.  I think Saturday was an isolated incident. I can wash my  hair with both hands, can reach to second shelf in cabinet, hug my husband.  Reaching high above head is still difficult and lifting something heavy is difficult.   Special Tests: FOTO was 57 at initial eval and is currently 48. Pain Assessment Currently in Pain?: No/denies Pain Score: 0-No pain   Additional Assessments LUE AROM (degrees) LUE Overall AROM Comments: assessed in seated ER/IR wtih shoulder adducted (11/19/12) Left Shoulder Flexion: 120 Degrees (109) Left Shoulder ABduction: 110 Degrees (106) Left Shoulder Internal Rotation: 80 Degrees (82) Left Shoulder External Rotation: 32 Degrees (25) LUE Strength LUE Overall Strength Comments: strength assessed in seated, ER/IR wtih shoulder adducted (11/19/12) Left Shoulder Flexion:  (4-/5 (3-/5)) Left Shoulder ABduction:  (4-/5 (3-/5)) Left Shoulder Internal Rotation:  (4-/5 (3/5)) Left Shoulder External Rotation:  (4-/5 (2-/5))     Exercise/Treatments    Manual Therapy Manual Therapy: Myofascial release Myofascial Release: MFR and manual stretching to left upper arm and scapularis region to decrease fascial restrictions and increase joint mobility in a pain free zone  Occupational Therapy Assessment and Plan OT Assessment and Plan Clinical Impression Statement: A:  Patient is conitnuing to make slow steady progress  towards goals.  She is going to complete HEP only for 2 weeks until follow up with MD and then decide what next options would be.   OT Plan: P:  Await MD orders for next steps.    Goals Short Term Goals Short Term Goal 1: Patient will be educated on HEP.  Short Term Goal 1 Progress: Met Short Term Goal 2: Patient will increase left shoulder PROM to [facility] for increased independence shaving her underarm. Short Term Goal 2 Progress: Met Short Term Goal 3: Patient will increase her left shoulder strength to 3/5 for increased ability to wash her hair. Short Term Goal 3 Progress: Met Short Term Goal 4: Patient will decrease pain in her left shoulder to 4/10 or better when sleeping. Short Term Goal 4 Progress: Met Short Term Goal 5: Patient will decrease fascial restrictions to minimal in her left shoulder region.    Short Term Goal 5 Progress: Progressing toward goal Long Term Goals Long Term Goal 1: Patient will return to highest level of independence with all daily, leisure, and work activities.  Long Term Goal 1 Progress: Progressing toward goal Long Term Goal 2: Patient will increase her left shoulder AROM to WNL for increased ability to reach out and close car door once inside.  Long Term Goal 2 Progress: Progressing toward goal Long Term Goal 3: Patient will increase her left shoulder strength to 4-/5 for increased ability to lift items above shoulder level.   Long Term Goal 3 Progress: Met Long Term Goal 4: Patient will decrease pain in her left shoulder to 2/10 or less during daily activities. Long Term Goal 4 Progress: Met Long Term Goal 5: Patient  will decrease fascial restriction in her left shoulder to min. Long Term Goal 5 Progress: Progressing toward goal  Problem List Patient Active Problem List   Diagnosis Date Noted  . Pain in joint, shoulder region 10/12/2012  . Muscle weakness (generalized) 10/12/2012  . Arthritis, shoulder region 10/06/2012  . Frozen shoulder syndrome  10/06/2012  . 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 Frozen shoulder 09/17/2012  . Bilateral leg weakness 12/04/2010    End of Session Activity Tolerance: Patient tolerated treatment well General Behavior During Therapy: [facility] for tasks assessed/performed OT Plan of Care OT Home Exercise Plan: stretching and AROM.  GO    Shirlean Mylar, OTR/L  12/15/2012, 10:12 AM  Physician Documentation Your signature is required to indicate approval of the treatment plan as stated above.  Please sign and either send electronically or make a copy of this report for your files and return this physician signed original.  Please mark one 1.__approve of plan  2. ___approve of plan with the following conditions.   ______________________________                                                          _____________________ Physician Signature                                                                                                             Date

## 2012-12-20 ENCOUNTER — Ambulatory Visit (HOSPITAL_COMMUNITY): Payer: Self-pay | Admitting: Occupational Therapy

## 2012-12-22 ENCOUNTER — Ambulatory Visit (HOSPITAL_COMMUNITY): Payer: Self-pay | Admitting: Occupational Therapy

## 2012-12-24 ENCOUNTER — Ambulatory Visit (HOSPITAL_COMMUNITY): Payer: Self-pay | Admitting: Specialist

## 2012-12-27 ENCOUNTER — Ambulatory Visit (HOSPITAL_COMMUNITY): Payer: Self-pay | Admitting: Occupational Therapy

## 2012-12-29 ENCOUNTER — Ambulatory Visit (HOSPITAL_COMMUNITY): Payer: Self-pay | Admitting: Occupational Therapy

## 2012-12-30 ENCOUNTER — Ambulatory Visit: Payer: Self-pay | Admitting: Orthopedic Surgery

## 2012-12-31 ENCOUNTER — Ambulatory Visit (HOSPITAL_COMMUNITY): Payer: Self-pay | Admitting: Occupational Therapy

## 2013-02-01 ENCOUNTER — Encounter: Payer: Self-pay | Admitting: Orthopedic Surgery

## 2013-02-01 ENCOUNTER — Ambulatory Visit: Payer: Self-pay | Admitting: Orthopedic Surgery

## 2013-10-12 ENCOUNTER — Encounter (HOSPITAL_COMMUNITY): Payer: Self-pay | Admitting: Emergency Medicine

## 2013-10-12 ENCOUNTER — Emergency Department (HOSPITAL_COMMUNITY)
Admission: EM | Admit: 2013-10-12 | Discharge: 2013-10-12 | Disposition: A | Discharge status: Home / Self Care | Payer: Managed Care, Other (non HMO) | Attending: Emergency Medicine | Admitting: Emergency Medicine

## 2013-10-12 DIAGNOSIS — E119 Type 2 diabetes mellitus without complications: Secondary | ICD-10-CM | POA: Diagnosis not present

## 2013-10-12 DIAGNOSIS — R002 Palpitations: Secondary | ICD-10-CM | POA: Diagnosis not present

## 2013-10-12 DIAGNOSIS — Z79899 Other long term (current) drug therapy: Secondary | ICD-10-CM | POA: Insufficient documentation

## 2013-10-12 DIAGNOSIS — R0789 Other chest pain: Secondary | ICD-10-CM | POA: Diagnosis not present

## 2013-10-12 DIAGNOSIS — Z794 Long term (current) use of insulin: Secondary | ICD-10-CM | POA: Insufficient documentation

## 2013-10-12 DIAGNOSIS — R079 Chest pain, unspecified: Secondary | ICD-10-CM | POA: Insufficient documentation

## 2013-10-12 HISTORY — DX: Disorder of thyroid, unspecified: E07.9

## 2013-10-12 LAB — CBC WITH DIFFERENTIAL/PLATELET
BASOS PCT: 1 % (ref 0–1)
Basophils Absolute: 0.1 10*3/uL (ref 0.0–0.1)
Eosinophils Absolute: 0.4 10*3/uL (ref 0.0–0.7)
Eosinophils Relative: 6 % — ABNORMAL HIGH (ref 0–5)
HEMATOCRIT: 38.7 % (ref 36.0–46.0)
HEMOGLOBIN: 13.3 g/dL (ref 12.0–15.0)
LYMPHS ABS: 1.8 10*3/uL (ref 0.7–4.0)
LYMPHS PCT: 32 % (ref 12–46)
MCH: 29.4 pg (ref 26.0–34.0)
MCHC: 34.4 g/dL (ref 30.0–36.0)
MCV: 85.6 fL (ref 78.0–100.0)
MONO ABS: 0.5 10*3/uL (ref 0.1–1.0)
MONOS PCT: 9 % (ref 3–12)
NEUTROS ABS: 3 10*3/uL (ref 1.7–7.7)
NEUTROS PCT: 52 % (ref 43–77)
Platelets: 227 10*3/uL (ref 150–400)
RBC: 4.52 MIL/uL (ref 3.87–5.11)
RDW: 12.6 % (ref 11.5–15.5)
WBC: 5.8 10*3/uL (ref 4.0–10.5)

## 2013-10-12 LAB — COMPREHENSIVE METABOLIC PANEL
ALK PHOS: 91 U/L (ref 39–117)
ALT: 10 U/L (ref 0–35)
ANION GAP: 11 (ref 5–15)
AST: 11 U/L (ref 0–37)
Albumin: 3.5 g/dL (ref 3.5–5.2)
BUN: 13 mg/dL (ref 6–23)
CHLORIDE: 100 meq/L (ref 96–112)
CO2: 27 meq/L (ref 19–32)
CREATININE: 0.49 mg/dL — AB (ref 0.50–1.10)
Calcium: 9.2 mg/dL (ref 8.4–10.5)
GLUCOSE: 345 mg/dL — AB (ref 70–99)
POTASSIUM: 4.2 meq/L (ref 3.7–5.3)
Sodium: 138 mEq/L (ref 137–147)
Total Protein: 6.6 g/dL (ref 6.0–8.3)

## 2013-10-12 LAB — TROPONIN I

## 2013-10-12 NOTE — Discharge Instructions (Signed)
Followup with cardiology to discuss a Holter or event monitor.  The contact information for cardiology in [place] has been provided in this discharge summary.  Return to the emergency department if you develop severe pain, difficulty breathing, or other new and concerning symptoms.   Palpitations A palpitation is the feeling that your heartbeat is irregular or is faster than normal. It may feel like your heart is fluttering or skipping a beat. Palpitations are usually not a serious problem. However, in some cases, you may need further medical evaluation. CAUSES  Palpitations can be caused by:  Smoking.  Caffeine or other stimulants, such as diet pills or energy drinks.  Alcohol.  Stress and anxiety.  Strenuous physical activity.  Fatigue.  Certain medicines.  Heart disease, especially if you have a history of irregular heart rhythms (arrhythmias), such as atrial fibrillation, atrial flutter, or supraventricular tachycardia.  An improperly working pacemaker or defibrillator. DIAGNOSIS  To find the cause of your palpitations, your health care provider will take your medical history and perform a physical exam. Your health care provider may also have you take a test called an ambulatory electrocardiogram (ECG). An ECG records your heartbeat patterns over a 24-hour period. You may also have other tests, such as:  Transthoracic echocardiogram (TTE). During echocardiography, sound waves are used to evaluate how blood flows through your heart.  Transesophageal echocardiogram (TEE).  Cardiac monitoring. This allows your health care provider to monitor your heart rate and rhythm in real time.  Holter monitor. This is a portable device that records your heartbeat and can help diagnose heart arrhythmias. It allows your health care provider to track your heart activity for several days, if needed.  Stress tests by exercise or by giving medicine that makes the heart beat  faster. TREATMENT  Treatment of palpitations depends on the cause of your symptoms and can vary greatly. Most cases of palpitations do not require any treatment other than time, relaxation, and monitoring your symptoms. Other causes, such as atrial fibrillation, atrial flutter, or supraventricular tachycardia, usually require further treatment. HOME CARE INSTRUCTIONS   Avoid:  Caffeinated coffee, tea, soft drinks, diet pills, and energy drinks.  Chocolate.  Alcohol.  Stop smoking if you smoke.  Reduce your stress and anxiety. Things that can help you relax include:  A method of controlling things in your body, such as your heartbeats, with your mind (biofeedback).  Yoga.  Meditation.  Physical activity such as swimming, jogging, or walking.  Get plenty of rest and sleep. SEEK MEDICAL CARE IF:   You continue to have a fast or irregular heartbeat beyond 24 hours.  Your palpitations occur more often. SEEK IMMEDIATE MEDICAL CARE IF:  You have chest pain or shortness of breath.  You have a severe headache.  You feel dizzy or you faint. MAKE SURE YOU:  Understand these instructions.  Will watch your condition.  Will get help right away if you are not doing well or get worse. Document Released: 01/04/2000 Document Revised: 01/11/2013 Document Reviewed: 03/07/2011 [facility] Patient Information 2015 [place], [place]. This information is not intended to replace advice given to you by your health care provider. Make sure you discuss any questions you have with your health care provider.  Chest Pain (Nonspecific) It is often hard to give a specific diagnosis for the cause of chest pain. There is always a chance that your pain could be related to something serious, such as a heart attack or a blood clot in the lungs. You need to  follow up with your health care provider for further evaluation. CAUSES   Heartburn.  Pneumonia or bronchitis.  Anxiety or stress.  Inflammation  around your heart (pericarditis) or lung (pleuritis or pleurisy).  A blood clot in the lung.  A collapsed lung (pneumothorax). It can develop suddenly on its own (spontaneous pneumothorax) or from trauma to the chest.  Shingles infection (herpes zoster virus). The chest wall is composed of bones, muscles, and cartilage. Any of these can be the source of the pain.  The bones can be bruised by injury.  The muscles or cartilage can be strained by coughing or overwork.  The cartilage can be affected by inflammation and become sore (costochondritis). DIAGNOSIS  Lab tests or other studies may be needed to find the cause of your pain. Your health care provider may have you take a test called an ambulatory electrocardiogram (ECG). An ECG records your heartbeat patterns over a 24-hour period. You may also have other tests, such as:  Transthoracic echocardiogram (TTE). During echocardiography, sound waves are used to evaluate how blood flows through your heart.  Transesophageal echocardiogram (TEE).  Cardiac monitoring. This allows your health care provider to monitor your heart rate and rhythm in real time.  Holter monitor. This is a portable device that records your heartbeat and can help diagnose heart arrhythmias. It allows your health care provider to track your heart activity for several days, if needed.  Stress tests by exercise or by giving medicine that makes the heart beat faster. TREATMENT   Treatment depends on what may be causing your chest pain. Treatment may include:  Acid blockers for heartburn.  Anti-inflammatory medicine.  Pain medicine for inflammatory conditions.  Antibiotics if an infection is present.  You may be advised to change lifestyle habits. This includes stopping smoking and avoiding alcohol, caffeine, and chocolate.  You may be advised to keep your head raised (elevated) when sleeping. This reduces the chance of acid going backward from your stomach into  your esophagus. Most of the time, nonspecific chest pain will improve within 2-3 days with rest and mild pain medicine.  HOME CARE INSTRUCTIONS   If antibiotics were prescribed, take them as directed. Finish them even if you start to feel better.  For the next few days, avoid physical activities that bring on chest pain. Continue physical activities as directed.  Do not use any tobacco products, including cigarettes, chewing tobacco, or electronic cigarettes.  Avoid drinking alcohol.  Only take medicine as directed by your health care provider.  Follow your health care provider's suggestions for further testing if your chest pain does not go away.  Keep any follow-up appointments you made. If you do not go to an appointment, you could develop lasting (chronic) problems with pain. If there is any problem keeping an appointment, call to reschedule. SEEK MEDICAL CARE IF:   Your chest pain does not go away, even after treatment.  You have a rash with blisters on your chest.  You have a fever. SEEK IMMEDIATE MEDICAL CARE IF:   You have increased chest pain or pain that spreads to your arm, neck, jaw, back, or abdomen.  You have shortness of breath.  You have an increasing cough, or you cough up blood.  You have severe back or abdominal pain.  You feel nauseous or vomit.  You have severe weakness.  You faint.  You have chills. This is an emergency. Do not wait to see if the pain will go away. Get medical help  at once. Call your local emergency services (911 in [place] Do not drive yourself to the hospital. MAKE SURE YOU:   Understand these instructions.  Will watch your condition.  Will get help right away if you are not doing well or get worse. Document Released: 10/16/2004 Document Revised: 01/11/2013 Document Reviewed: 08/12/2007 [facility] Patient Information 2015 [place], [place]. This information is not intended to replace advice given to you by your health care  provider. Make sure you discuss any questions you have with your health care provider.

## 2013-10-12 NOTE — ED Notes (Signed)
Pt reporting palpitations over the last couple of days with a more severe episode lasting roughly 2 hours this evening. Denies any chest pain at present. States when she feels the palpitations it can make her SOB. No distress at present.

## 2013-10-12 NOTE — ED Notes (Signed)
Pt states it feels like her heart is skipping beats and is making her sob and cough when it skips x 2 days.

## 2013-10-12 NOTE — ED Notes (Signed)
Discharge instructions given and reviewed with patient.  Patient verbalized understanding to follow up with cardiology to discuss a Holter monitor.  Patient denies pain at this time.  Patient ambulatory; discharged home in good condition.

## 2013-10-12 NOTE — ED Provider Notes (Signed)
CSN: [id number]     Arrival date & time 10/12/13  1919 History  This chart was scribed for Geoffery Lyons, MD by Gwenyth Ober, ED Scribe. This patient was seen in room APA04/APA04 and the patient's care was started at 8:14 PM.       Chief Complaint  Patient presents with  . Chest Pain   The history is provided by the patient. No language interpreter was used.   HPI Comments: [name] is a 43 y.o. female with a history of DM, who presents to the Emergency Department complaining of intermittent, gradually worsening palpitations with associated SOB that started two days ago. She states that her palpitations are accompanied by brief, sharp chest pain. Pt has a history of similar symptoms, but has not seen a doctor about it. She states that she came to the ER today because the frequency of these symptoms increased. In the last month, pt was not feeling well and she visited her doctor where she was diagnosed with thyroid issues (medicated with Synthroid). Pt states that she has had abnormal blood sugar levels that stays in the 300s associated with the diagnosis. Pt started Voltaren two weeks ago. Pt denies dyspnea and problems with eating and drinking as associated symptoms.   Past Medical History  Diagnosis Date  . Diabetes   . Thyroid disease    Past Surgical History  Procedure Laterality Date  . Caesarean     Family History  Problem Relation Age of Onset  . Diabetes     History  Substance Use Topics  . Smoking status: Never Smoker   . Smokeless tobacco: Not on file  . Alcohol Use: Yes   OB History   Grav Para Term Preterm Abortions TAB SAB Ect Mult Living                 Review of Systems 10 Systems reviewed and all are negative for acute change except as noted in the HPI.     Allergies  Review of patient's allergies indicates no known allergies.  Home Medications   Prior to Admission medications   Medication Sig Start Date End Date Taking? Authorizing Provider   diclofenac (VOLTAREN) 75 MG EC tablet Take 1 tablet (75 mg total) by mouth 2 (two) times daily. 09/16/12   Merlyn Albert, MD  insulin glargine (LANTUS) 100 UNIT/ML injection Inject 30 Units into the skin daily.    Historical Provider, MD  PRESCRIPTION MEDICATION NOVALOG SLIDING SCALE    Historical Provider, MD   BP 108/72  Pulse 95  Temp(Src) 98.6 F (37 C) (Oral)  Resp 16  Ht  (1.575 m)  Wt 128 lb (58.06 kg)  BMI 23.41 kg/m2  SpO2 99%  LMP 10/10/2013 Physical Exam  Nursing note and vitals reviewed. Constitutional: She is oriented to person, place, and time. She appears well-developed and well-nourished. No distress.  HENT:  Head: Normocephalic and atraumatic.  Mouth/Throat: Oropharynx is clear and moist. No oropharyngeal exudate.  Eyes: Pupils are equal, round, and reactive to light.  Neck: Neck supple.  Cardiovascular: Normal rate, regular rhythm and normal heart sounds.   Pulmonary/Chest: Effort normal and breath sounds normal. No respiratory distress. She has no wheezes.  Abdominal: Soft. She exhibits no distension. There is no tenderness.  Musculoskeletal: She exhibits no edema.  Neurological: She is alert and oriented to person, place, and time. No cranial nerve deficit.  Skin: Skin is warm and dry. No rash noted.  Psychiatric: She has a normal mood  and affect. Her behavior is normal.    ED Course  Procedures (including critical care time) DIAGNOSTIC STUDIES: Oxygen Saturation is 99% on RA, normal by my interpretation.    COORDINATION OF CARE: 8:18 PM-Will order lab work and will monitor pt in the ED. Discussed treatment plan with pt. Pt agreed to plan.  Labs Review Labs Reviewed - No data to display  Imaging Review No results found.   EKG Interpretation None      MDM   Final diagnoses:  None    Patient presents with complaints of palpitations and chest discomfort for the past 2 days. This is nonexertional and she has no risk factors with the  exception of type 1 diabetes. Workup today reveals normal troponin and EKG. She was monitored and there was no ectopy. At this point I feel as though discharge is appropriate with outpatient followup to discuss a Holter monitor. I will give the followup information for cardiology in [place] with him she can discuss this possibility.  I personally performed the services described in this documentation, which was scribed in my presence. The recorded information has been reviewed and is accurate.        Geoffery Lyons, MD 10/12/13 2131

## 2013-10-17 ENCOUNTER — Telehealth: Payer: Self-pay | Admitting: *Deleted

## 2013-10-17 NOTE — Telephone Encounter (Signed)
error 

## 2013-10-20 ENCOUNTER — Ambulatory Visit (INDEPENDENT_AMBULATORY_CARE_PROVIDER_SITE_OTHER): Payer: Managed Care, Other (non HMO) | Admitting: Cardiology

## 2013-10-20 ENCOUNTER — Encounter: Payer: Self-pay | Admitting: Cardiology

## 2013-10-20 VITALS — BP 106/64 | HR 97 | Ht 62.0 in | Wt 132.0 lb

## 2013-10-20 DIAGNOSIS — R002 Palpitations: Secondary | ICD-10-CM

## 2013-10-20 NOTE — Patient Instructions (Signed)
Your physician recommends that you schedule a follow-up appointment in: TO BE DETERMINED AFTER HOLTER      Your physician has recommended that you wear a holter monitor. Holter monitors are medical devices that record the heart's electrical activity. Doctors most often use these monitors to diagnose arrhythmias. Arrhythmias are problems with the speed or rhythm of the heartbeat. The monitor is a small, portable device. You can wear one while you do your normal daily activities. This is usually used to diagnose what is causing palpitations/syncope (passing out).        Thank you for choosing [facility] !

## 2013-10-20 NOTE — Progress Notes (Signed)
     Clinical Summary [name] is a 43 y.o.female seen today as a new patient for palpitations.   1. Palpitations - seen in ER 10/12/13 with palpitations - tests in ER showed K 4.2, Cr 0.49, trop neg x1. EKG NSR - symptoms started approx 1 month ago. Last week was occuring very frequently, multiple times a day. Now down to twice a day. Currently lasts just a few seconds. No other symptoms.  - coffee x 2-3 cups, coke zeros, no tea, no energy drinks, just occasional EtOH.  - reports mild occasional skipped beats for several years, seemed to have significant increased after starting synthroid 25 mcg  approx 3 weeks ago      Past Medical History  Diagnosis Date  . Diabetes   . Thyroid disease      No Known Allergies   Current Outpatient Prescriptions  Medication Sig Dispense Refill  . diclofenac (VOLTAREN) 75 MG EC tablet Take 1 tablet (75 mg total) by mouth 2 (two) times daily.  60 tablet  1  . insulin glargine (LANTUS) 100 UNIT/ML injection Inject 30 Units into the skin daily.      Marland Kitchen. PRESCRIPTION MEDICATION NOVALOG SLIDING SCALE       No current facility-administered medications for this visit.     Past Surgical History  Procedure Laterality Date  . Caesarean       No Known Allergies    Family History  Problem Relation Age of Onset  . Diabetes       Social History Ms. Kreider reports that she has never smoked. She does not have any smokeless tobacco history on file. Ms. Calton GoldsSeverance reports that she drinks alcohol.   Review of Systems CONSTITUTIONAL: No weight loss, fever, chills, weakness or fatigue.  HEENT: Eyes: No visual loss, blurred vision, double vision or yellow sclerae.No hearing loss, sneezing, congestion, runny nose or sore throat.  SKIN: No rash or itching.  CARDIOVASCULAR: per HPI RESPIRATORY: No shortness of breath, cough or sputum.  GASTROINTESTINAL: No anorexia, nausea, vomiting or diarrhea. No abdominal pain or blood.  GENITOURINARY:  No burning on urination, no polyuria NEUROLOGICAL: No headache, dizziness, syncope, paralysis, ataxia, numbness or tingling in the extremities. No change in bowel or bladder control.  MUSCULOSKELETAL: No muscle, back pain, joint pain or stiffness.  LYMPHATICS: No enlarged nodes. No history of splenectomy.  PSYCHIATRIC: No history of depression or anxiety.  ENDOCRINOLOGIC: No reports of sweating, cold or heat intolerance. No polyuria or polydipsia.  Marland Kitchen.   Physical Examination p 97 bp 106/64 Wt 132 lbs BMI 24 Gen: resting comfortably, no acute distress HEENT: no scleral icterus, pupils equal round and reactive, no palptable cervical adenopathy,  CV: RRR, no m/r/g, no JVD, no carotid bruits Resp: Clear to auscultation bilaterally GI: abdomen is soft, non-tender, non-distended, normal bowel sounds, no hepatosplenomegaly MSK: extremities are warm, no edema.  Skin: warm, no rash Neuro:  no focal deficits Psych: appropriate affect    Assessment and Plan  1. Palpitations - will obtain 24 hr monitor to further evaluate for ectopy of arrhythmias - counseled on cut down on caffeine intake - f/u pending results of monitor     Antoine PocheJonathan F. Brier Reid, M.D.

## 2013-10-24 ENCOUNTER — Ambulatory Visit (HOSPITAL_COMMUNITY)
Admission: RE | Admit: 2013-10-24 | Discharge: 2013-10-24 | Disposition: A | Discharge status: Home / Self Care | Payer: Managed Care, Other (non HMO) | Source: Ambulatory Visit | Attending: Cardiology | Admitting: Cardiology

## 2013-10-24 DIAGNOSIS — R002 Palpitations: Secondary | ICD-10-CM | POA: Diagnosis present

## 2013-10-24 DIAGNOSIS — R000 Tachycardia, unspecified: Secondary | ICD-10-CM | POA: Diagnosis not present

## 2013-10-24 NOTE — Progress Notes (Signed)
24 hr Holter Monitor in progress. 

## 2013-11-14 ENCOUNTER — Telehealth: Payer: Self-pay | Admitting: *Deleted

## 2013-11-14 NOTE — Telephone Encounter (Signed)
Pt aware and wanted Dr. Wyline MoodBranch to know that she is doing much better, will forward to Dr. Melony OverlyBranch FYI. Forwarded to Dr. Gerda DissLuking

## 2013-11-14 NOTE — Telephone Encounter (Signed)
Message copied by Vernon PreyBARKER, Awilda Covin T on Mon Nov 14, 2013 12:54 PM ------      Message from: NormanBRANCH, JONATHAN F      Created: Mon Nov 14, 2013 10:28 AM       Heart monitor without any significant abnormal rhythms. Please see how symptoms are doing with less caffeine as we talked about last visit, if continued we can consider starting a medication to help            Dominga FerryJ Branch MD ------

## 2013-11-14 NOTE — Telephone Encounter (Signed)
Thanks for update. She may follow up with us as needed, or have Dr Gerda DissLuking set her back up with us if symptoms return. At this time no need for scheduled follow up, she may call us if she needs us  Dominga FerryJ Branch MD

## 2014-04-19 ENCOUNTER — Other Ambulatory Visit: Payer: Self-pay | Admitting: Family Medicine

## 2014-04-19 ENCOUNTER — Encounter: Payer: Self-pay | Admitting: Family Medicine

## 2014-04-19 ENCOUNTER — Ambulatory Visit (HOSPITAL_COMMUNITY)
Admission: RE | Admit: 2014-04-19 | Discharge: 2014-04-19 | Disposition: A | Discharge status: Home / Self Care | Payer: Managed Care, Other (non HMO) | Source: Ambulatory Visit | Attending: Family Medicine | Admitting: Family Medicine

## 2014-04-19 ENCOUNTER — Other Ambulatory Visit (HOSPITAL_COMMUNITY)
Admission: RE | Admit: 2014-04-19 | Discharge: 2014-04-19 | Disposition: A | Discharge status: Home / Self Care | Payer: Managed Care, Other (non HMO) | Source: Ambulatory Visit | Attending: Family Medicine | Admitting: Family Medicine

## 2014-04-19 ENCOUNTER — Ambulatory Visit (INDEPENDENT_AMBULATORY_CARE_PROVIDER_SITE_OTHER): Payer: Managed Care, Other (non HMO) | Admitting: Family Medicine

## 2014-04-19 VITALS — BP 120/82 | Temp 98.6°F | Ht 62.0 in | Wt 136.4 lb

## 2014-04-19 DIAGNOSIS — R109 Unspecified abdominal pain: Secondary | ICD-10-CM

## 2014-04-19 LAB — CBC WITH DIFFERENTIAL/PLATELET
BASOS PCT: 1 % (ref 0–1)
Basophils Absolute: 0.1 10*3/uL (ref 0.0–0.1)
EOS ABS: 0.4 10*3/uL (ref 0.0–0.7)
EOS PCT: 5 % (ref 0–5)
HEMATOCRIT: 41.5 % (ref 36.0–46.0)
Hemoglobin: 13.9 g/dL (ref 12.0–15.0)
LYMPHS PCT: 31 % (ref 12–46)
Lymphs Abs: 2.3 10*3/uL (ref 0.7–4.0)
MCH: 29.1 pg (ref 26.0–34.0)
MCHC: 33.5 g/dL (ref 30.0–36.0)
MCV: 86.8 fL (ref 78.0–100.0)
MONO ABS: 0.6 10*3/uL (ref 0.1–1.0)
Monocytes Relative: 8 % (ref 3–12)
Neutro Abs: 4 10*3/uL (ref 1.7–7.7)
Neutrophils Relative %: 55 % (ref 43–77)
Platelets: 264 10*3/uL (ref 150–400)
RBC: 4.78 MIL/uL (ref 3.87–5.11)
RDW: 13.5 % (ref 11.5–15.5)
WBC: 7.3 10*3/uL (ref 4.0–10.5)

## 2014-04-19 LAB — BASIC METABOLIC PANEL
BUN: 6 mg/dL (ref 6–23)
CO2: 26 mEq/L (ref 19–32)
CREATININE: 0.64 mg/dL (ref 0.50–1.10)
Calcium: 9.1 mg/dL (ref 8.4–10.5)
Chloride: 105 mEq/L (ref 96–112)
GLUCOSE: 170 mg/dL — AB (ref 70–99)
Potassium: 4 mEq/L (ref 3.5–5.3)
SODIUM: 136 meq/L (ref 135–145)

## 2014-04-19 LAB — POCT URINALYSIS DIPSTICK
Spec Grav, UA: <=1.005
pH, UA: 7

## 2014-04-19 LAB — HEPATIC FUNCTION PANEL
ALBUMIN: 4.1 g/dL (ref 3.5–5.2)
ALK PHOS: 57 U/L (ref 39–117)
ALT: 12 U/L (ref 0–35)
AST: 16 U/L (ref 0–37)
Total Bilirubin: 0.5 mg/dL (ref 0.3–1.2)
Total Protein: 6.9 g/dL (ref 6.0–8.3)

## 2014-04-19 NOTE — Progress Notes (Signed)
   Subjective:    Patient ID: [name], female    DOB: 1970-07-21, 44 y.o.   MRN: [id number]  HPI Patient arrives with complaint of right side pain and bloating for 2 days.  Couple days ago felt a knot sens in right flank  Not ime for cycle  Using midol  Prn  hys gave abx to pet yest  Bloated sens  ,maybe yest a little more uriating  Patient has history of type 1 diabetes. Therefore more anxious about pain like this.  Pain is in right flank also right upper quadrant. No major change in urination.  Some bloating sensation. No vomiting  Slight nausea.  Fair appetite today   Review of Systems No headache no throat pain no bowel habit changes noted fever no chills    Objective:   Physical Exam Alert moderate malaise vital stable lungs clear heart rare rhythm plus minus right CVA tenderness right upper quadrant also tender no rebound no guarding good bowel sounds next  Urinalysis unremarkable       Assessment & Plan:  ImpressionI  your I I right upper quadrant pain fairly significant. Yet blood work good. We did an ultrasound gallbladder normal. Right kidney normal right upper quadrant normal. I spoke with patient several hours after initial assessment. Was feeling overall better. With negative blood work and negative ultrasound elected to go with symptom care only. Patient in agreement with this warning signs discussed WSL

## 2014-05-02 ENCOUNTER — Other Ambulatory Visit: Payer: Self-pay | Admitting: *Deleted

## 2014-10-04 ENCOUNTER — Ambulatory Visit: Payer: Managed Care, Other (non HMO) | Admitting: Family Medicine

## 2015-01-09 ENCOUNTER — Ambulatory Visit (INDEPENDENT_AMBULATORY_CARE_PROVIDER_SITE_OTHER): Payer: Managed Care, Other (non HMO)

## 2015-01-09 ENCOUNTER — Ambulatory Visit (INDEPENDENT_AMBULATORY_CARE_PROVIDER_SITE_OTHER): Payer: Managed Care, Other (non HMO) | Admitting: Orthopedic Surgery

## 2015-01-09 VITALS — BP 124/87 | Ht 62.0 in | Wt 148.0 lb

## 2015-01-09 DIAGNOSIS — M169 Osteoarthritis of hip, unspecified: Secondary | ICD-10-CM | POA: Diagnosis not present

## 2015-01-09 DIAGNOSIS — M25551 Pain in right hip: Secondary | ICD-10-CM

## 2015-01-09 DIAGNOSIS — M24159 Other articular cartilage disorders, unspecified hip: Secondary | ICD-10-CM

## 2015-01-09 MED ORDER — DICLOFENAC POTASSIUM 50 MG PO TABS: 50.0000 mg | ORAL_TABLET | Freq: Two times a day (BID) | ORAL | Status: DC
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
## 2015-01-09 NOTE — Patient Instructions (Signed)
Call [facility] therapy dept to schedule therapy visits- [phone] 

## 2015-01-23 ENCOUNTER — Ambulatory Visit (HOSPITAL_COMMUNITY): Payer: Managed Care, Other (non HMO) | Attending: Orthopedic Surgery

## 2015-01-23 DIAGNOSIS — M25351 Other instability, right hip: Secondary | ICD-10-CM

## 2015-01-23 DIAGNOSIS — R29898 Other symptoms and signs involving the musculoskeletal system: Secondary | ICD-10-CM | POA: Diagnosis present

## 2015-01-23 DIAGNOSIS — M2420 Disorder of ligament, unspecified site: Secondary | ICD-10-CM

## 2015-01-23 DIAGNOSIS — M169 Osteoarthritis of hip, unspecified: Secondary | ICD-10-CM | POA: Insufficient documentation

## 2015-01-23 DIAGNOSIS — M24159 Other articular cartilage disorders, unspecified hip: Secondary | ICD-10-CM

## 2015-01-23 NOTE — Patient Instructions (Signed)
Knee Extension: Sit to Stand (Eccentric)    Stand close to chair. Slowly lower self to seated position. _8_ reps per set, _2_ sets per day, _7_ days per week. Progress to stopping midway before lowering to chair. Progress to barely touching chair.  Copyright  VHI. All rights reserved.    Bridge    Lying on back, legs bent 90, feet flat on floor. Press up hips and torso, reaching hands to feet. Perform 2 sets of 10 reps 1 time daily.    Copyright  VHI. All rights reserved.
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
## 2015-01-24 ENCOUNTER — Encounter: Payer: Self-pay | Admitting: *Deleted

## 2015-01-25 ENCOUNTER — Ambulatory Visit (HOSPITAL_COMMUNITY): Payer: Managed Care, Other (non HMO)

## 2015-01-25 DIAGNOSIS — M169 Osteoarthritis of hip, unspecified: Secondary | ICD-10-CM | POA: Diagnosis not present

## 2015-01-25 DIAGNOSIS — R29898 Other symptoms and signs involving the musculoskeletal system: Secondary | ICD-10-CM

## 2015-01-25 DIAGNOSIS — M24159 Other articular cartilage disorders, unspecified hip: Secondary | ICD-10-CM

## 2015-01-25 DIAGNOSIS — M2420 Disorder of ligament, unspecified site: Secondary | ICD-10-CM

## 2015-01-25 DIAGNOSIS — M25351 Other instability, right hip: Secondary | ICD-10-CM
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
## 2015-02-06 ENCOUNTER — Ambulatory Visit (HOSPITAL_COMMUNITY): Payer: Managed Care, Other (non HMO) | Admitting: Physical Therapy

## 2015-02-08 ENCOUNTER — Ambulatory Visit (HOSPITAL_COMMUNITY): Payer: Managed Care, Other (non HMO) | Admitting: Physical Therapy

## 2015-02-08 DIAGNOSIS — R29898 Other symptoms and signs involving the musculoskeletal system: Secondary | ICD-10-CM

## 2015-02-08 DIAGNOSIS — M2420 Disorder of ligament, unspecified site: Secondary | ICD-10-CM

## 2015-02-08 DIAGNOSIS — M169 Osteoarthritis of hip, unspecified: Secondary | ICD-10-CM | POA: Diagnosis not present

## 2015-02-08 DIAGNOSIS — M24159 Other articular cartilage disorders, unspecified hip: Secondary | ICD-10-CM

## 2015-02-08 NOTE — Therapy (Signed)
[facility] [address] [place], [place], [address] Phone: [phone]   Fax:  [phone]  Physical Therapy Treatment  Patient Details  Name: [name] MRN: [id number] Date of Birth: 07-16-1970 Referring Provider: Fuller Canada   Encounter Date: 02/08/2015      PT End of Session - 02/08/15 1530    Visit Number 3   Number of Visits 12   Date for PT Re-Evaluation 02/23/15   Authorization Type Aetna   Authorization Time Period 01/23/15-03/23/15   Authorization - Visit Number 3   Authorization - Number of Visits 12   PT Start Time 0850   PT Stop Time 0930   PT Time Calculation (min) 40 min   Activity Tolerance Patient tolerated treatment well   Behavior During Therapy [facility] for tasks assessed/performed      Past Medical History  Diagnosis Date  . Diabetes   . Thyroid disease     Past Surgical History  Procedure Laterality Date  . Caesarean      There were no vitals filed for this visit.  Visit Diagnosis:  Labral tear of hip, degenerative  Weakness of right hip  Ligamentous laxity of multiple sites  Poor body mechanics      Subjective Assessment - 02/08/15 0859    Subjective PT states her hip has not hurt in a couple weeks.  sTates last time she felt it was when she planted and rotated  her body and felt a twinge down her Rt hip.  Pt states the week before christmas was the last time it "locked"up.   Currently in Pain? No/denies                         [facility] Adult PT Treatment/Exercise - 02/08/15 0900    Knee/Hip Exercises: Seated   Sit to Sand 2 sets;10 reps;without UE support   Knee/Hip Exercises: Supine   Bridges 15 reps   Straight Leg Raises Both;10 reps   Other Supine Knee/Hip Exercises bent knee raise 10x 5" with ab set   Knee/Hip Exercises: Sidelying   Hip ABduction Both;10 reps   Clams 10x 3" Bil   Knee/Hip Exercises: Prone   Hip Extension Both;10 reps                   PT Short Term Goals - 01/23/15 1740    PT SHORT TERM GOAL #1   Title Pt will demonstrate independence in beginning home exercise program by twos weeks after commencement of therapy, to affirm self-efficacy in work at home to making progress toward goals.   Status New   PT SHORT TERM GOAL #2   Title After 2 weeks, pt will describe in detail 3 ways to manage exacerbation of symptoms to demonstrate greater activity tolerance to work and leisure activity.    Status New   PT SHORT TERM GOAL #3   Title After 3 weeks patient will demonstrate decreased pain with palpation of the R ASIS while palpated in prone.    Status New           PT Long Term Goals - 01/23/15 1743    PT LONG TERM GOAL #1   Title Pt will demonstrate independence in advanced home exercise program by 1 week prior to discharge, to further self-efficacy in continuation of progress toward goals after discharge from therapy.    Status New   PT LONG TERM GOAL #2   Title After 6 weeks  patient will demonstrate 5/5 strength in all R hip muscle groups to restore to PLOF in functional mobility.    Status New   PT LONG TERM GOAL #3   Title After 6 weeks pt will tolerate 5 hours of working without exacerbation of bilateral knee pain, to demonstrate improved activity tolerance for working related tasks.    Status New               Plan - 02/08/15 1530    Clinical Impression Statement PT has not been to clinic X 2 weeks due to crazy work schedule and being sick.  Pt admits that she has not been doing her HEP and feels she will be sore after today.  Pt is overall improved, however without pain today.  Resumed all therex and cues given to improve form.     PT Next Visit Plan Continue with core and hip strengthening.  Begin yoga poses next session to improve hip and glute stability.  PRogress to vector stance as well.         Problem List Patient Active Problem List   Diagnosis Date Noted  . Pain in joint, shoulder region  10/12/2012  . Muscle weakness (generalized) 10/12/2012  . Arthritis, shoulder region 10/06/2012  . 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 Frozen shoulder 09/17/2012  . Bilateral leg weakness 12/04/2010    Lurena Nida, PTA/CLT [phone] 02/08/2015, 3:33 PM  [facility] [address] [place], [place], [address] Phone: [phone]   Fax:  [phone]  Name: [name] MRN: [id number] Date of Birth: 1970-02-09

## 2015-02-12 ENCOUNTER — Telehealth (HOSPITAL_COMMUNITY): Payer: Self-pay | Admitting: Physical Therapy

## 2015-02-12 ENCOUNTER — Ambulatory Visit (HOSPITAL_COMMUNITY): Payer: Managed Care, Other (non HMO) | Admitting: Physical Therapy

## 2015-02-12 NOTE — Telephone Encounter (Signed)
Called pt and left message re: missed appointment.  Pt reminded of appointment on 1/26.  Virgina Organ, PT CLT [phone]

## 2015-02-15 ENCOUNTER — Ambulatory Visit (HOSPITAL_COMMUNITY): Payer: Managed Care, Other (non HMO)

## 2015-02-15 DIAGNOSIS — M2420 Disorder of ligament, unspecified site: Secondary | ICD-10-CM

## 2015-02-15 DIAGNOSIS — M25351 Other instability, right hip: Secondary | ICD-10-CM

## 2015-02-15 DIAGNOSIS — R29898 Other symptoms and signs involving the musculoskeletal system: Secondary | ICD-10-CM

## 2015-02-15 DIAGNOSIS — M169 Osteoarthritis of hip, unspecified: Principal | ICD-10-CM

## 2015-02-15 DIAGNOSIS — M24159 Other articular cartilage disorders, unspecified hip: Secondary | ICD-10-CM
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
## 2015-02-19 ENCOUNTER — Ambulatory Visit (HOSPITAL_COMMUNITY): Payer: Managed Care, Other (non HMO) | Admitting: Physical Therapy

## 2015-02-20 ENCOUNTER — Ambulatory Visit: Payer: Managed Care, Other (non HMO) | Admitting: Orthopedic Surgery

## 2015-02-22 ENCOUNTER — Ambulatory Visit (HOSPITAL_COMMUNITY): Payer: Managed Care, Other (non HMO) | Admitting: Physical Therapy

## 2015-02-26 ENCOUNTER — Ambulatory Visit (INDEPENDENT_AMBULATORY_CARE_PROVIDER_SITE_OTHER): Payer: Managed Care, Other (non HMO) | Admitting: Orthopedic Surgery

## 2015-02-26 VITALS — BP 117/70 | Ht 62.0 in | Wt 143.0 lb

## 2015-02-26 DIAGNOSIS — M169 Osteoarthritis of hip, unspecified: Secondary | ICD-10-CM | POA: Diagnosis not present

## 2015-02-26 DIAGNOSIS — M25551 Pain in right hip: Secondary | ICD-10-CM | POA: Diagnosis not present

## 2015-02-26 DIAGNOSIS — M24159 Other articular cartilage disorders, unspecified hip: Secondary | ICD-10-CM

## 2015-02-26 MED ORDER — DICLOFENAC POTASSIUM 50 MG PO TABS: 50.0000 mg | ORAL_TABLET | Freq: Two times a day (BID) | ORAL | Status: DC

## 2015-02-26 NOTE — Addendum Note (Signed)
Addended by: Adella Hare B on: 02/26/2015 10:41 AM   Modules accepted: Orders

## 2015-02-26 NOTE — Progress Notes (Signed)
Follow-up visit  Right hip pain  Patient sent for physical therapy took diclofenac.  She says her pain has been relieved including any knee pain and the therapy went well  Review of systems no GI tract symptoms no complications from the medicine no nausea vomiting.  BP 117/70 mmHg  Ht  (1.575 m)  Wt 143 lb (64.864 kg)  BMI 26.15 kg/m2 She is awake alert no 3 mood and affect are normal. She has no gait disturbances  Leg lengths are equal  She has equal hip flexion normal internal rotation in both hips  Neurovascular exam intact distally  Assessment possible labral tear versus synovitis of the right hip versus flexor tendinitis  Diclofenac doing well, refill  Follow-up as needed  Take medicine as needed.

## 2015-03-21 LAB — HM DIABETES EYE EXAM

## 2015-04-13 ENCOUNTER — Other Ambulatory Visit: Payer: Self-pay | Admitting: *Deleted

## 2015-04-16 ENCOUNTER — Encounter (HOSPITAL_COMMUNITY): Payer: Self-pay

## 2015-04-16 NOTE — Therapy (Signed)
[place], [place], [address] Phone: [phone]   Fax:  [phone]  Patient Details  Name: [name] MRN: [id number] Date of Birth: Jun 07, 1970 Referring Provider:  No ref. provider found  Encounter Date: 04/16/2015 OCCUPATIONAL THERAPY DISCHARGE SUMMARY  Visits from Start of Care: 18  Current functional level related to goals / functional outcomes: Short Term Goals Short Term Goal 1: Patient will be educated on HEP.  Short Term Goal 1 Progress: Met Short Term Goal 2: Patient will increase left shoulder PROM to [facility] for increased independence shaving her underarm. Short Term Goal 2 Progress: Met Short Term Goal 3: Patient will increase her left shoulder strength to 3/5 for increased ability to wash her hair. Short Term Goal 3 Progress: Met Short Term Goal 4: Patient will decrease pain in her left shoulder to 4/10 or better when sleeping. Short Term Goal 4 Progress: Met Short Term Goal 5: Patient will decrease fascial restrictions to minimal in her left shoulder region.  Short Term Goal 5 Progress: Progressing toward goal Long Term Goals Long Term Goal 1: Patient will return to highest level of independence with all daily, leisure, and work activities.  Long Term Goal 1 Progress: Progressing toward goal Long Term Goal 2: Patient will increase her left shoulder AROM to WNL for increased ability to reach out and close car door once inside.  Long Term Goal 2 Progress: Progressing toward goal Long Term Goal 3: Patient will increase her left shoulder strength to 4-/5 for increased ability to lift items above shoulder level.  Long Term Goal 3 Progress: Met Long Term Goal 4: Patient will decrease pain in her left shoulder to 2/10 or less during daily activities. Long Term Goal 4 Progress: Met Long Term Goal 5: Patient will decrease fascial restriction in her left shoulder to min. Long Term Goal 5 Progress:  Progressing toward goal   Patient was last seen  12/13/12 and was never officially discharged. Patient met all goes except 1 short and 1 long term goal. Plan: Patient agrees to discharge.  Patient goals were met. Patient is being discharged due to meeting the stated rehab goals.  ?????       Ailene Ravel, OTR/L,CBIS  [phone]  04/16/2015, 11:37 AM  [facility] [address] [place], [place], [address] Phone: [phone]   Fax:  [phone]

## 2015-07-03 ENCOUNTER — Emergency Department (HOSPITAL_COMMUNITY)
Admission: EM | Admit: 2015-07-03 | Discharge: 2015-07-03 | Disposition: A | Discharge status: Home / Self Care | Payer: Managed Care, Other (non HMO) | Attending: Emergency Medicine | Admitting: Emergency Medicine

## 2015-07-03 ENCOUNTER — Encounter (HOSPITAL_COMMUNITY): Payer: Self-pay | Admitting: Emergency Medicine

## 2015-07-03 DIAGNOSIS — R112 Nausea with vomiting, unspecified: Secondary | ICD-10-CM | POA: Insufficient documentation

## 2015-07-03 DIAGNOSIS — Z79899 Other long term (current) drug therapy: Secondary | ICD-10-CM | POA: Insufficient documentation

## 2015-07-03 DIAGNOSIS — Z794 Long term (current) use of insulin: Secondary | ICD-10-CM | POA: Diagnosis not present

## 2015-07-03 DIAGNOSIS — E860 Dehydration: Secondary | ICD-10-CM | POA: Diagnosis not present

## 2015-07-03 DIAGNOSIS — R739 Hyperglycemia, unspecified: Secondary | ICD-10-CM

## 2015-07-03 DIAGNOSIS — E1165 Type 2 diabetes mellitus with hyperglycemia: Secondary | ICD-10-CM | POA: Insufficient documentation

## 2015-07-03 DIAGNOSIS — R531 Weakness: Secondary | ICD-10-CM | POA: Diagnosis not present

## 2015-07-03 LAB — URINALYSIS, ROUTINE W REFLEX MICROSCOPIC
Bilirubin Urine: NEGATIVE
Glucose, UA: >1000 mg/dL — AB
HGB URINE DIPSTICK: NEGATIVE
Ketones, ur: 40 mg/dL — AB
LEUKOCYTES UA: NEGATIVE
Nitrite: NEGATIVE
Protein, ur: NEGATIVE mg/dL
SPECIFIC GRAVITY, URINE: 1.01 (ref 1.005–1.030)
pH: 5.5 (ref 5.0–8.0)

## 2015-07-03 LAB — CBC
HCT: 40.3 % (ref 36.0–46.0)
HEMOGLOBIN: 13.4 g/dL (ref 12.0–15.0)
MCH: 29.5 pg (ref 26.0–34.0)
MCHC: 33.3 g/dL (ref 30.0–36.0)
MCV: 88.8 fL (ref 78.0–100.0)
Platelets: 201 10*3/uL (ref 150–400)
RBC: 4.54 MIL/uL (ref 3.87–5.11)
RDW: 13.2 % (ref 11.5–15.5)
WBC: 11.2 10*3/uL — ABNORMAL HIGH (ref 4.0–10.5)

## 2015-07-03 LAB — URINE MICROSCOPIC-ADD ON

## 2015-07-03 LAB — BASIC METABOLIC PANEL
ANION GAP: 10 (ref 5–15)
BUN: 16 mg/dL (ref 6–20)
CALCIUM: 8.9 mg/dL (ref 8.9–10.3)
CHLORIDE: 102 mmol/L (ref 101–111)
CO2: 23 mmol/L (ref 22–32)
Creatinine, Ser: 0.88 mg/dL (ref 0.44–1.00)
GFR calc non Af Amer: >60 mL/min (ref >60)
Glucose, Bld: 389 mg/dL — ABNORMAL HIGH (ref 65–99)
Potassium: 4.1 mmol/L (ref 3.5–5.1)
Sodium: 135 mmol/L (ref 135–145)

## 2015-07-03 LAB — CBG MONITORING, ED
GLUCOSE-CAPILLARY: 390 mg/dL — AB (ref 65–99)
GLUCOSE-CAPILLARY: 344 mg/dL — AB (ref 65–99)
GLUCOSE-CAPILLARY: 211 mg/dL — AB (ref 65–99)
Glucose-Capillary: 263 mg/dL — ABNORMAL HIGH (ref 65–99)

## 2015-07-03 LAB — HEPATIC FUNCTION PANEL
ALBUMIN: 4 g/dL (ref 3.5–5.0)
ALT: 19 U/L (ref 14–54)
AST: 20 U/L (ref 15–41)
Alkaline Phosphatase: 61 U/L (ref 38–126)
BILIRUBIN TOTAL: 0.8 mg/dL (ref 0.3–1.2)
Bilirubin, Direct: 0.2 mg/dL (ref 0.1–0.5)
Indirect Bilirubin: 0.6 mg/dL (ref 0.3–0.9)
TOTAL PROTEIN: 6.5 g/dL (ref 6.5–8.1)

## 2015-07-03 LAB — PREGNANCY, URINE: Preg Test, Ur: NEGATIVE

## 2015-07-03 MED ORDER — INSULIN ASPART 100 UNIT/ML IV SOLN: 10.0000 [IU] | Freq: Once | INTRAVENOUS | Status: DC

## 2015-07-03 MED ORDER — SODIUM CHLORIDE 0.9 % IV BOLUS (SEPSIS)
1000.0000 mL | Freq: Once | INTRAVENOUS | Status: AC
  Administered 2015-07-03: 1000 mL via INTRAVENOUS

## 2015-07-03 MED ORDER — INSULIN ASPART 100 UNIT/ML ~~LOC~~ SOLN
SUBCUTANEOUS | Status: AC
  Filled 2015-07-03: qty 1

## 2015-07-03 MED ORDER — SODIUM CHLORIDE 0.9 % IV BOLUS (SEPSIS)
2000.0000 mL | Freq: Once | INTRAVENOUS | Status: AC
  Administered 2015-07-03: 2000 mL via INTRAVENOUS

## 2015-07-03 NOTE — Discharge Instructions (Signed)
Dehydration, Adult Dehydration is a condition in which you do not have enough fluid or water in your body. It happens when you take in less fluid than you lose. Vital organs such as the kidneys, brain, and heart cannot function without a proper amount of fluids. Any loss of fluids from the body can cause dehydration.  Dehydration can range from mild to severe. This condition should be treated right away to help prevent it from becoming severe. CAUSES  This condition may be caused by:  Vomiting.  Diarrhea.  Excessive sweating, such as when exercising in hot or humid weather.  Not drinking enough fluid during strenuous exercise or during an illness.  Excessive urine output.  Fever.  Certain medicines. RISK FACTORS This condition is more likely to develop in:  People who are taking certain medicines that cause the body to lose excess fluid (diuretics).   People who have a chronic illness, such as diabetes, that may increase urination.  Older adults.   People who live at high altitudes.   People who participate in endurance sports.  SYMPTOMS  Mild Dehydration  Thirst.  Dry lips.  Slightly dry mouth.  Dry, warm skin. Moderate Dehydration  Very dry mouth.   Muscle cramps.   Dark urine and decreased urine production.   Decreased tear production.   Headache.   Light-headedness, especially when you stand up from a sitting position.  Severe Dehydration  Changes in skin.   Cold and clammy skin.   Skin does not spring back quickly when lightly pinched and released.   Changes in body fluids.   Extreme thirst.   No tears.   Not able to sweat when body temperature is high, such as in hot weather.   Minimal urine production.   Changes in vital signs.   Rapid, weak pulse (more than 100 beats per minute when you are sitting still).   Rapid breathing.   Low blood pressure.   Other changes.   Sunken eyes.   Cold hands and feet.    Confusion.  Lethargy and difficulty being awakened.  Fainting (syncope).   Short-term weight loss.   Unconsciousness. DIAGNOSIS  This condition may be diagnosed based on your symptoms. You may also have tests to determine how severe your dehydration is. These tests may include:   Urine tests.   Blood tests.  TREATMENT  Treatment for this condition depends on the severity. Mild or moderate dehydration can often be treated at home. Treatment should be started right away. Do not wait until dehydration becomes severe. Severe dehydration needs to be treated at the hospital. Treatment for Mild Dehydration  Drinking plenty of water to replace the fluid you have lost.   Replacing minerals in your blood (electrolytes) that you may have lost.  Treatment for Moderate Dehydration  Consuming oral rehydration solution (ORS). Treatment for Severe Dehydration  Receiving fluid through an IV tube.   Receiving electrolyte solution through a feeding tube that is passed through your nose and into your stomach (nasogastric tube or NG tube).  Correcting any abnormalities in electrolytes. HOME CARE INSTRUCTIONS   Drink enough fluid to keep your urine clear or pale yellow.   Drink water or fluid slowly by taking small sips. You can also try sucking on ice cubes.  Have food or beverages that contain electrolytes. Examples include bananas and sports drinks.  Take over-the-counter and prescription medicines only as told by your health care provider.   Prepare ORS according to the manufacturer's instructions. Take sips  of ORS every 5 minutes until your urine returns to normal.  If you have vomiting or diarrhea, continue to try to drink water, ORS, or both.   If you have diarrhea, avoid:   Beverages that contain caffeine.   Fruit juice.   Milk.   Carbonated soft drinks.  Do not take salt tablets. This can lead to the condition of having too much sodium in your body  (hypernatremia).  SEEK MEDICAL CARE IF:  You cannot eat or drink without vomiting.  You have had moderate diarrhea during a period of more than 24 hours.  You have a fever. SEEK IMMEDIATE MEDICAL CARE IF:   You have extreme thirst.  You have severe diarrhea.  You have not urinated in 6-8 hours, or you have urinated only a small amount of very dark urine.  You have shriveled skin.  You are dizzy, confused, or both.   This information is not intended to replace advice given to you by your health care provider. Make sure you discuss any questions you have with your health care provider.   Document Released: 01/06/2005 Document Revised: 09/27/2014 Document Reviewed: 05/24/2014 Elsevier Interactive Patient Education 2016 Elsevier Inc.  Hyperglycemia Hyperglycemia occurs when the glucose (sugar) in your blood is too high. Hyperglycemia can happen for many reasons, but it most often happens to people who do not know they have diabetes or are not managing their diabetes properly.  CAUSES  Whether you have diabetes or not, there are other causes of hyperglycemia. Hyperglycemia can occur when you have diabetes, but it can also occur in other situations that you might not be as aware of, such as: Diabetes  If you have diabetes and are having problems controlling your blood glucose, hyperglycemia could occur because of some of the following reasons:  Not following your meal plan.  Not taking your diabetes medications or not taking it properly.  Exercising less or doing less activity than you normally do.  Being sick. Pre-diabetes  This cannot be ignored. Before people develop Type 2 diabetes, they almost always have "pre-diabetes." This is when your blood glucose levels are higher than normal, but not yet high enough to be diagnosed as diabetes. Research has shown that some long-term damage to the body, especially the heart and circulatory system, may already be occurring during  pre-diabetes. If you take action to manage your blood glucose when you have pre-diabetes, you may delay or prevent Type 2 diabetes from developing. Stress  If you have diabetes, you may be "diet" controlled or on oral medications or insulin to control your diabetes. However, you may find that your blood glucose is higher than usual in the hospital whether you have diabetes or not. This is often referred to as "stress hyperglycemia." Stress can elevate your blood glucose. This happens because of hormones put out by the body during times of stress. If stress has been the cause of your high blood glucose, it can be followed regularly by your caregiver. That way he/she can make sure your hyperglycemia does not continue to get worse or progress to diabetes. Steroids  Steroids are medications that act on the infection fighting system (immune system) to block inflammation or infection. One side effect can be a rise in blood glucose. Most people can produce enough extra insulin to allow for this rise, but for those who cannot, steroids make blood glucose levels go even higher. It is not unusual for steroid treatments to "uncover" diabetes that is developing. It is not  always possible to determine if the hyperglycemia will go away after the steroids are stopped. A special blood test called an A1c is sometimes done to determine if your blood glucose was elevated before the steroids were started. SYMPTOMS  Thirsty.  Frequent urination.  Dry mouth.  Blurred vision.  Tired or fatigue.  Weakness.  Sleepy.  Tingling in feet or leg. DIAGNOSIS  Diagnosis is made by monitoring blood glucose in one or all of the following ways:  A1c test. This is a chemical found in your blood.  Fingerstick blood glucose monitoring.  Laboratory results. TREATMENT  First, knowing the cause of the hyperglycemia is important before the hyperglycemia can be treated. Treatment may include, but is not be limited  to:  Education.  Change or adjustment in medications.  Change or adjustment in meal plan.  Treatment for an illness, infection, etc.  More frequent blood glucose monitoring.  Change in exercise plan.  Decreasing or stopping steroids.  Lifestyle changes. HOME CARE INSTRUCTIONS   Test your blood glucose as directed.  Exercise regularly. Your caregiver will give you instructions about exercise. Pre-diabetes or diabetes which comes on with stress is helped by exercising.  Eat wholesome, balanced meals. Eat often and at regular, fixed times. Your caregiver or nutritionist will give you a meal plan to guide your sugar intake.  Being at an ideal weight is important. If needed, losing as little as 10 to 15 pounds may help improve blood glucose levels. SEEK MEDICAL CARE IF:   You have questions about medicine, activity, or diet.  You continue to have symptoms (problems such as increased thirst, urination, or weight gain). SEEK IMMEDIATE MEDICAL CARE IF:   You are vomiting or have diarrhea.  Your breath smells fruity.  You are breathing faster or slower.  You are very sleepy or incoherent.  You have numbness, tingling, or pain in your feet or hands.  You have chest pain.  Your symptoms get worse even though you have been following your caregiver's orders.  If you have any other questions or concerns.   This information is not intended to replace advice given to you by your health care provider. Make sure you discuss any questions you have with your health care provider.   Document Released: 07/02/2000 Document Revised: 03/31/2011 Document Reviewed: 09/12/2014 Elsevier Interactive Patient Education [employer] Elsevier Inc.

## 2015-07-03 NOTE — ED Provider Notes (Signed)
CSN: [id number]     Arrival date & time 07/03/15  1741 History   First MD Initiated Contact with Patient 07/03/15 1817     Chief Complaint  Patient presents with  . Hyperglycemia     (Consider location/radiation/quality/duration/timing/severity/associated sxs/prior Treatment) HPI Patient with history of type 1 diabetes well controlled with insulin pump. States she was in her normal health earlier today. She once had one episode of vomiting and 1 stool. States her blood sugar and it was high as well as elevated ketones. She now feels fatigued but denies any current pain. No fever or chills. States she has constant urinary frequency but this is unchanged. Denies any chest or abdominal pain. No known sick contacts. Past Medical History  Diagnosis Date  . Diabetes (HCC)   . Thyroid disease    Past Surgical History  Procedure Laterality Date  . Caesarean     Family History  Problem Relation Age of Onset  . Diabetes     Social History  Substance Use Topics  . Smoking status: Never Smoker   . Smokeless tobacco: None  . Alcohol Use: Yes   OB History    No data available     Review of Systems  Constitutional: Positive for fatigue. Negative for fever and chills.  Respiratory: Negative for cough and shortness of breath.   Cardiovascular: Negative for chest pain.  Gastrointestinal: Positive for nausea, vomiting and diarrhea. Negative for abdominal pain.  Genitourinary: Positive for frequency. Negative for dysuria, hematuria, flank pain and difficulty urinating.  Musculoskeletal: Negative for myalgias, back pain, neck pain and neck stiffness.  Skin: Negative for rash and wound.  Neurological: Positive for weakness (generalized). Negative for dizziness, light-headedness, numbness and headaches.  All other systems reviewed and are negative.     Allergies  Review of patient's allergies indicates no known allergies.  Home Medications   Prior to Admission medications   Medication  Sig Start Date End Date Taking? Authorizing Provider  diclofenac (VOLTAREN) 50 MG EC tablet Take 50 mg by mouth daily. 05/21/15  Yes Historical Provider, MD  insulin aspart (NOVOLOG) 100 UNIT/ML injection by Pump Prime route continuous.   Yes Historical Provider, MD  Insulin Human (INSULIN PUMP) SOLN Inject into the skin continuous.    Yes Historical Provider, MD  levothyroxine (SYNTHROID, LEVOTHROID) 25 MCG tablet Take 25 mcg by mouth daily before breakfast.   Yes Historical Provider, MD  Multiple Vitamin (MULTIVITAMIN WITH MINERALS) TABS tablet Take 1 tablet by mouth daily.   Yes Historical Provider, MD   BP 109/71 mmHg  Pulse 96  Temp(Src) 98.2 F (36.8 C) (Oral)  Resp 17  Ht  (1.575 m)  Wt 145 lb (65.772 kg)  BMI 26.51 kg/m2  SpO2 96%  LMP 06/24/2015 Physical Exam  Constitutional: She is oriented to person, place, and time. She appears well-developed and well-nourished. No distress.  HENT:  Head: Normocephalic and atraumatic.  Mouth/Throat: Oropharynx is clear and moist. No oropharyngeal exudate.  Eyes: EOM are normal. Pupils are equal, round, and reactive to light.  Neck: Normal range of motion. Neck supple. No JVD present.  Cardiovascular: Regular rhythm.  Exam reveals no gallop and no friction rub.   No murmur heard. Tachycardia  Pulmonary/Chest: Effort normal and breath sounds normal. No respiratory distress. She has no wheezes. She has no rales. She exhibits no tenderness.  Abdominal: Soft. Bowel sounds are normal. She exhibits no distension and no mass. There is no tenderness. There is no rebound and no guarding.  Insulin pump in place on the left hip  Musculoskeletal: Normal range of motion. She exhibits no edema or tenderness.  No CVA tenderness bilaterally. No lower extremity swelling, asymmetry or tenderness.  Neurological: She is alert and oriented to person, place, and time.  Moves all ext without deficit. Sensation is fully intact.  Skin: Skin is warm and dry. No  rash noted. No erythema.  Psychiatric: She has a normal mood and affect. Her behavior is normal.  Nursing note and vitals reviewed.   ED Course  Procedures (including critical care time) Labs Review Labs Reviewed  BASIC METABOLIC PANEL - Abnormal; Notable for the following:    Glucose, Bld 389 (*)    All other components within normal limits  CBC - Abnormal; Notable for the following:    WBC 11.2 (*)    All other components within normal limits  URINALYSIS, ROUTINE W REFLEX MICROSCOPIC (NOT AT [facility]) - Abnormal; Notable for the following:    Glucose, UA >1000 (*)    Ketones, ur 40 (*)    All other components within normal limits  URINE MICROSCOPIC-ADD ON - Abnormal; Notable for the following:    Squamous Epithelial / LPF 0-5 (*)    Bacteria, UA FEW (*)    All other components within normal limits  CBG MONITORING, ED - Abnormal; Notable for the following:    Glucose-Capillary 390 (*)    All other components within normal limits  CBG MONITORING, ED - Abnormal; Notable for the following:    Glucose-Capillary 344 (*)    All other components within normal limits  CBG MONITORING, ED - Abnormal; Notable for the following:    Glucose-Capillary 263 (*)    All other components within normal limits  CBG MONITORING, ED - Abnormal; Notable for the following:    Glucose-Capillary 211 (*)    All other components within normal limits  HEPATIC FUNCTION PANEL  PREGNANCY, URINE    Imaging Review No results found. I have personally reviewed and evaluated these images and lab results as part of my medical decision-making.   EKG Interpretation None      MDM   Final diagnoses:  Hyperglycemia  Dehydration   Blood sugars improved to 211. Patient says she is feeling much better after IV fluids. No evidence of DKA. Will discharge home to follow-up with her primary physician. Return for cautions given.     Loren Raceravid Lattie Cervi, MD 07/03/15 2121

## 2015-07-03 NOTE — ED Notes (Signed)
Pt reports CBG 354 at home. States she wears an insulin pump. Pt also reports v/d. States she used a ketone test kit at it read moderate to high. Pt also reports palpitations.

## 2015-08-01 ENCOUNTER — Encounter: Payer: Self-pay | Admitting: Family Medicine

## 2015-08-01 ENCOUNTER — Ambulatory Visit (INDEPENDENT_AMBULATORY_CARE_PROVIDER_SITE_OTHER): Payer: Managed Care, Other (non HMO) | Admitting: Family Medicine

## 2015-08-01 VITALS — BP 104/70 | Temp 97.9°F | Ht 62.0 in | Wt 149.4 lb

## 2015-08-01 DIAGNOSIS — R21 Rash and other nonspecific skin eruption: Secondary | ICD-10-CM | POA: Diagnosis not present

## 2015-08-01 NOTE — Progress Notes (Signed)
   Subjective:    Patient ID: [name], female    DOB: 1970/03/02, 45 y.o.   MRN: [id number]  HPI Patient is here today for a insect bite to her left forearm. Onset 2 days ago. Redness and itching noted. Treatments tried: ice pack with no relief.   Patient has no other concerns at this time.   mon aft had a puppy on the arm  Felt sonmething on the arm  As the dsay went on, dev rash and swelling and swollen  And raisedm, with a line around itr    afriend who is a Engineer, civil (consulting)nurse, then   Headache yesterday  Trash around skin was   Wondered if a spider bite  Lighter  Colored    Review of Systems No headache, no major weight loss or weight gain, no chest pain no back pain abdominal pain no change in bowel habits complete ROS otherwise negative     Objective:   Physical Exam Alert vitals stable, NAD. Blood pressure good on repeat. HEENT normal. Lungs clear. Heart regular rate and rhythm. Left forearm erythematous patch 2 cm across swollen clearly pruritic central bite       Assessment & Plan:  Impression envenomation with local allergic reaction. Highly doubt Lyme disease rationale discussed. Plan symptom care discussed warning signs discussed 15 minutes spent most in discussion

## 2015-11-02 ENCOUNTER — Other Ambulatory Visit: Payer: Self-pay | Admitting: *Deleted

## 2015-11-02 ENCOUNTER — Telehealth: Payer: Self-pay | Admitting: Orthopedic Surgery

## 2015-11-02 MED ORDER — DICLOFENAC SODIUM 50 MG PO TBEC: 50.0000 mg | DELAYED_RELEASE_TABLET | Freq: Every day | ORAL | 5 refills | Status: DC

## 2015-11-02 NOTE — Telephone Encounter (Signed)
REFILL SENT 

## 2015-11-02 NOTE — Telephone Encounter (Signed)
Refill request received, via fax, [facility], Diclofenac Sod DR 50 mg T.

## 2016-02-11 DIAGNOSIS — E109 Type 1 diabetes mellitus without complications: Secondary | ICD-10-CM | POA: Diagnosis not present

## 2016-02-11 DIAGNOSIS — E7800 Pure hypercholesterolemia, unspecified: Secondary | ICD-10-CM | POA: Diagnosis not present

## 2016-02-11 DIAGNOSIS — E039 Hypothyroidism, unspecified: Secondary | ICD-10-CM | POA: Diagnosis not present

## 2016-02-11 DIAGNOSIS — Z9641 Presence of insulin pump (external) (internal): Secondary | ICD-10-CM | POA: Diagnosis not present

## 2016-04-12 ENCOUNTER — Other Ambulatory Visit: Payer: Self-pay | Admitting: Orthopedic Surgery

## 2016-04-16 ENCOUNTER — Ambulatory Visit: Payer: Self-pay | Admitting: Family Medicine

## 2016-05-16 ENCOUNTER — Ambulatory Visit (INDEPENDENT_AMBULATORY_CARE_PROVIDER_SITE_OTHER): Payer: BLUE CROSS/BLUE SHIELD | Admitting: Nurse Practitioner

## 2016-05-16 ENCOUNTER — Encounter: Payer: Self-pay | Admitting: Nurse Practitioner

## 2016-05-16 VITALS — BP 118/78 | Ht 62.0 in | Wt 151.6 lb

## 2016-05-16 DIAGNOSIS — M778 Other enthesopathies, not elsewhere classified: Secondary | ICD-10-CM | POA: Diagnosis not present

## 2016-05-16 MED ORDER — DICLOFENAC SODIUM 75 MG PO TBEC: 75.0000 mg | DELAYED_RELEASE_TABLET | Freq: Two times a day (BID) | ORAL | 0 refills | Status: DC

## 2016-05-18 ENCOUNTER — Encounter: Payer: Self-pay | Admitting: Nurse Practitioner

## 2016-05-18 NOTE — Progress Notes (Signed)
Subjective:  Presents for c/o pain and swelling in the left hand progressive over a month. Has had tenderness and locking in the left thumb. Sugars are good. No history of injury or bite. No neck, shoulder or elbow pain. Some wrist pain.   Objective:   BP 118/78   Ht  (1.575 m)   Wt 151 lb 9.6 oz (68.8 kg)   BMI 27.73 kg/m  NAD. Alert, oriented. Mild generalized edema in left wrist and hand. Radial pulse strong. Fingers pink, warm with normal cap refill. Normal hand strength. Sensation grossly intact. Phalen's and Tinel's negative. Mild tenderness with palpation and movement of left wrist.   Assessment:  Left wrist tendinitis    Plan:   Meds ordered this encounter  Medications  . diclofenac (VOLTAREN) 75 MG EC tablet    Sig: Take 1 tablet (75 mg total) by mouth 2 (two) times daily. Prn pain    Dispense:  60 tablet    Refill:  0    Order Specific Question:   Supervising Provider    Answer:   Merlyn Albert [[id number]]   Given Rx for wrist brace. Continue to monitor sugars. Call back in 7-10 days if no improvement, sooner if worse. Warning signs reviewed.

## 2016-05-28 ENCOUNTER — Ambulatory Visit (INDEPENDENT_AMBULATORY_CARE_PROVIDER_SITE_OTHER): Payer: BLUE CROSS/BLUE SHIELD | Admitting: Family Medicine

## 2016-05-28 ENCOUNTER — Encounter: Payer: Self-pay | Admitting: Family Medicine

## 2016-05-28 VITALS — BP 128/80 | Ht 62.0 in | Wt 152.5 lb

## 2016-05-28 DIAGNOSIS — Z8781 Personal history of (healed) traumatic fracture: Secondary | ICD-10-CM | POA: Diagnosis not present

## 2016-05-28 DIAGNOSIS — M65312 Trigger thumb, left thumb: Secondary | ICD-10-CM | POA: Diagnosis not present

## 2016-05-28 DIAGNOSIS — M792 Neuralgia and neuritis, unspecified: Secondary | ICD-10-CM
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
## 2016-06-03 ENCOUNTER — Ambulatory Visit: Payer: BLUE CROSS/BLUE SHIELD | Admitting: Family Medicine

## 2016-06-09 ENCOUNTER — Ambulatory Visit (HOSPITAL_COMMUNITY)
Admission: RE | Admit: 2016-06-09 | Discharge: 2016-06-09 | Disposition: A | Discharge status: Home / Self Care | Payer: BLUE CROSS/BLUE SHIELD | Source: Ambulatory Visit | Attending: Family Medicine | Admitting: Family Medicine

## 2016-06-09 DIAGNOSIS — Z8781 Personal history of (healed) traumatic fracture: Secondary | ICD-10-CM | POA: Diagnosis not present

## 2016-06-09 DIAGNOSIS — M792 Neuralgia and neuritis, unspecified: Secondary | ICD-10-CM | POA: Diagnosis not present

## 2016-06-10 DIAGNOSIS — M65312 Trigger thumb, left thumb: Secondary | ICD-10-CM | POA: Insufficient documentation

## 2016-07-16 ENCOUNTER — Other Ambulatory Visit: Payer: Self-pay | Admitting: Nurse Practitioner

## 2016-08-04 LAB — HM DIABETES EYE EXAM

## 2016-10-14 DIAGNOSIS — E109 Type 1 diabetes mellitus without complications: Secondary | ICD-10-CM | POA: Diagnosis not present

## 2016-10-14 DIAGNOSIS — E039 Hypothyroidism, unspecified: Secondary | ICD-10-CM | POA: Diagnosis not present

## 2016-10-14 DIAGNOSIS — E7800 Pure hypercholesterolemia, unspecified: Secondary | ICD-10-CM | POA: Diagnosis not present

## 2016-10-20 DIAGNOSIS — L918 Other hypertrophic disorders of the skin: Secondary | ICD-10-CM | POA: Diagnosis not present

## 2016-10-20 DIAGNOSIS — D1801 Hemangioma of skin and subcutaneous tissue: Secondary | ICD-10-CM | POA: Diagnosis not present

## 2016-10-20 DIAGNOSIS — L814 Other melanin hyperpigmentation: Secondary | ICD-10-CM | POA: Diagnosis not present

## 2016-10-20 DIAGNOSIS — D225 Melanocytic nevi of trunk: Secondary | ICD-10-CM | POA: Diagnosis not present

## 2016-11-15 DIAGNOSIS — E103512 Type 1 diabetes mellitus with proliferative diabetic retinopathy with macular edema, left eye: Secondary | ICD-10-CM | POA: Diagnosis not present

## 2016-11-15 LAB — HM DIABETES EYE EXAM

## 2016-11-17 ENCOUNTER — Telehealth: Payer: Self-pay | Admitting: Family Medicine

## 2016-11-17 NOTE — Telephone Encounter (Signed)
Patient seen Dr. Bing PlumeHaynes with [facility] over the weekend for losing vision in her left eye on Friday, but found her eyes are OK.  They told her that they were going to contact Dr. Gerda DissLuking and let him know they want her to have an MRI on her neck.  She wants to know if Dr. Brett CanalesSteve has been contacted?

## 2016-11-17 NOTE — Telephone Encounter (Signed)
rec o v tomorrow, we also need the note and any tets fron the specisalist

## 2016-11-17 NOTE — Telephone Encounter (Signed)
Discussed with pt. Pt verbalized understanding. Transferred to the front to schedule office visit tomorrow. Pt states specialist office told her they were sending notes and results today to our office. Message routed to medical records to follow up on getting notes and results.

## 2016-11-18 ENCOUNTER — Telehealth: Payer: Self-pay | Admitting: *Deleted

## 2016-11-18 ENCOUNTER — Ambulatory Visit (INDEPENDENT_AMBULATORY_CARE_PROVIDER_SITE_OTHER): Payer: BLUE CROSS/BLUE SHIELD | Admitting: Family Medicine

## 2016-11-18 ENCOUNTER — Encounter: Payer: Self-pay | Admitting: Family Medicine

## 2016-11-18 VITALS — BP 126/76 | Ht 62.0 in | Wt 151.0 lb

## 2016-11-18 DIAGNOSIS — G43109 Migraine with aura, not intractable, without status migrainosus: Secondary | ICD-10-CM | POA: Diagnosis not present

## 2016-11-18 DIAGNOSIS — H539 Unspecified visual disturbance: Secondary | ICD-10-CM | POA: Diagnosis not present

## 2016-11-18 NOTE — Progress Notes (Signed)
   Subjective:    Patient ID: [name], female    DOB: 17-Nov-1970, 46 y.o.   MRN: [id number]  HPI Patient here today to discuss the Retina specialist appt.   The patient presents with substantial concerns.  She also brings a lengthy document from a retina specialist.  This is all read and analyzed in the presence of the patient.  In the last month patient has had substantial headaches once or twice per week.  Fairly severe at times.  Often throbbing sensation and pounding pain.  Generally on the left side of the head.  Patient had a spell on Friday where she had central visual loss.  Described it as a white blurry region.  Saw the retina specialist.  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  They were concerned about potential for amaurosis fugax.  But they also with the potential for migraine headaches with visual aura.  On further history patient states she initiated hormones 1 year ago for low estrogen symptomatology.  Patient does not smoke.  Positive family history of stroke.  Positive history of long-term type 1 diabetes.  Patient's endocrinologist trying to get her on lipid medication but is not sure of her levels  Headache generally left-sided.  Throbbing at times.  Some element of nausea.  Review of Systems No headache, no major weight loss or weight gain, no chest pain no back pain abdominal pain no change in bowel habits complete ROS otherwise negative     Objective:   Physical Exam Alert and oriented, vitals reviewed and stable, NAD ENT-TM's and ext canals WNL bilat via otoscopic exam Soft palate, tonsils and post pharynx WNL via oropharyngeal exam Neck-symmetric, no masses; thyroid nonpalpable and nontender Pulmonary-no tachypnea or accessory muscle use; Clear without wheezes via auscultation Card--no abnrml murmurs, rhythm reg and rate WNL Carotid pulses symmetric, without bruits Neurological exam no  focal deficits carotid no bruits       Assessment & Plan:  Impression headaches Long discussion held.  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  Due to this strongly encourage patient to stop her hormone supplementation.  Rationale discussed at length  2.  Retinal specialist concern for small chance of emesis fugax.  Discussed at length.  Including etiology.  Will press on and do carotid ultrasound.  Even if this shows some element I still feel diagnosis #1 is most likely source of headaches and visual defect.   Use ibuprofen selectively for pain  Expect gradual resolution of frequency and severity of headaches.  If headaches persist return for further evaluation.  Further recommendations based on carotid ultrasound results Greater than 50% of this 40 or common migraine headache minute face to face visit was spent in counseling and discussion and coordination of care regarding the above diagnosis/diagnosies

## 2016-11-19 ENCOUNTER — Telehealth: Payer: Self-pay | Admitting: Family Medicine

## 2016-11-19 NOTE — Telephone Encounter (Signed)
ERROR

## 2016-11-26 ENCOUNTER — Ambulatory Visit (HOSPITAL_COMMUNITY)
Admission: RE | Admit: 2016-11-26 | Discharge: 2016-11-26 | Disposition: A | Discharge status: Home / Self Care | Payer: BLUE CROSS/BLUE SHIELD | Source: Ambulatory Visit | Attending: Family Medicine | Admitting: Family Medicine

## 2016-11-26 DIAGNOSIS — E119 Type 2 diabetes mellitus without complications: Secondary | ICD-10-CM | POA: Insufficient documentation

## 2016-11-26 DIAGNOSIS — I6529 Occlusion and stenosis of unspecified carotid artery: Secondary | ICD-10-CM | POA: Diagnosis not present

## 2016-11-26 DIAGNOSIS — H539 Unspecified visual disturbance: Secondary | ICD-10-CM

## 2016-11-26 DIAGNOSIS — E785 Hyperlipidemia, unspecified: Secondary | ICD-10-CM | POA: Diagnosis not present

## 2016-11-27 ENCOUNTER — Encounter: Payer: Self-pay | Admitting: Family Medicine

## 2016-12-02 DIAGNOSIS — Z202 Contact with and (suspected) exposure to infections with a predominantly sexual mode of transmission: Secondary | ICD-10-CM | POA: Diagnosis not present

## 2016-12-02 DIAGNOSIS — N739 Female pelvic inflammatory disease, unspecified: Secondary | ICD-10-CM | POA: Diagnosis not present

## 2016-12-02 DIAGNOSIS — N390 Urinary tract infection, site not specified: Secondary | ICD-10-CM | POA: Diagnosis not present

## 2016-12-17 ENCOUNTER — Encounter: Payer: Self-pay | Admitting: Women's Health

## 2016-12-17 ENCOUNTER — Ambulatory Visit (INDEPENDENT_AMBULATORY_CARE_PROVIDER_SITE_OTHER): Payer: BLUE CROSS/BLUE SHIELD | Admitting: Women's Health

## 2016-12-17 VITALS — BP 130/90 | HR 95 | Ht 62.0 in | Wt 152.0 lb

## 2016-12-17 DIAGNOSIS — R102 Pelvic and perineal pain: Secondary | ICD-10-CM

## 2016-12-17 DIAGNOSIS — N852 Hypertrophy of uterus: Secondary | ICD-10-CM

## 2016-12-17 LAB — POCT WET PREP (WET MOUNT)
Clue Cells Wet Prep Whiff POC: NEGATIVE
Trichomonas Wet Prep HPF POC: ABSENT

## 2016-12-17 NOTE — Progress Notes (Signed)
GYN VISIT Patient name: [name] PrestoCrystal Shanker MRN [id number]  Date of birth: February 18, 1970 Chief Complaint:   new gyn ([facility] x 2 week ago for pelvic pain)  History of Present Illness:   [name] is a 46 y.o. Caucasian female being seen today as a new pt for report of pelvic pain/pressure 2wks ago- was at work at [employer] standing on feet and pain just got so bad she couldn't take it, also had a vaginal odor, thought she had UTI- so went across street to urgent care- reports they told her not uti, did pap smear-which they called and told her was normal, pelvic exam-had a lot of pain w/ exam. Was told she had a pelvic infection, and given metronidazole BID x 2wks, finished last dose tonight. Is feeling much better, still has some pressure/pain, but nothing like it was. Vaginal odor has gone away. Was on coc's up until about 1mth ago, when they were stopped by pcp d/t suspicion of migraines w/ aura/visual defects- periods had been regular while on coc's. Just had first period off coc's that started 11/26, was heavy x 2 days, then stopped today which is unusual for her. Denies urinary frequency/urgency/hesitancy/hematuria. Reports family h/o uterine fibroids and endometriosis. Was seeing ob/gyn in [place], had normal pap last year, doesn't want to return there.      Patient's last menstrual period was 12/15/2016. Last pap reports pap at urgent care 2wks ago- doubt this was true pap, rather pelvic exam- will request records. Otherwise last pap 2017 in [place] Results were:  normal Review of Systems:   Pertinent items are noted in HPI Denies fever/chills, dizziness, headaches, visual disturbances, fatigue, shortness of breath, chest pain, abdominal pain, vomiting, abnormal vaginal discharge/itching/odor/irritation, problems with periods, bowel movements, urination, or intercourse unless otherwise stated above.  Pertinent History Reviewed:  Reviewed past medical,surgical, social, obstetrical and family  history.  Reviewed problem list, medications and allergies. Physical Assessment:   Vitals:   12/17/16 1419  BP: 130/90  Pulse: 95  Weight: 152 lb (68.9 kg)  Height: 5\' 2"  (1.575 m)  Body mass index is 27.8 kg/m.       Physical Examination:   General appearance: alert, well appearing, and in no distress  Mental status: alert, oriented to person, place, and time  Skin: warm & dry   Cardiovascular: normal heart rate noted, HRRR  Respiratory: normal respiratory effort, no distress, LCTAB  Abdomen: soft, non-tender   Pelvic: VULVA: normal appearing vulva with no masses, tenderness or lesions, VAGINA: normal appearing vagina with normal color and discharge, no lesions, small amt non-odorous menstrual blood, CERVIX: normal appearing cervix without discharge or lesions, UTERUS: feels enlarged ~ 16-17wk size, ADNEXA: no masses felt, + tenderness Rt side  Extremities: no edema   Results for orders placed or performed in visit on 12/17/16 (from the past 24 hour(s))  POCT Wet Prep [facility] Drown([facility] [place])   Collection Time: 12/17/16  3:20 PM  Result Value Ref Range   Source Wet Prep POC vaginal    WBC, Wet Prep HPF POC none    Bacteria Wet Prep HPF POC None (A) Few   BACTERIA WET PREP MORPHOLOGY POC     Clue Cells Wet Prep HPF POC None None   Clue Cells Wet Prep Whiff POC Negative Whiff    Yeast Wet Prep HPF POC None    KOH Wet Prep POC     Trichomonas Wet Prep HPF POC Absent Absent    Assessment & Plan:  1)  Resolving pelvic infection> finish last dose of antibiotics, request records from urgent care  2) Enlarged uterus w/ pelvic pain/pressure> will get pelvic u/s  Orders Placed This Encounter  Procedures  . US PELVIS (TRANSABDOMINAL ONLY)  . US PELVIS TRANSVANGINAL NON-OB (TV ONLY)  . POCT Wet [facility] [place])    Return for 1st available appt for pelvic u/s and f/u w/ MD, sign release for records from urgent care.  Marge DuncansBooker, Sheetal Lyall Randall CNM, [facility]NP-BC 12/17/2016 3:31 PM

## 2016-12-22 ENCOUNTER — Ambulatory Visit (INDEPENDENT_AMBULATORY_CARE_PROVIDER_SITE_OTHER): Payer: BLUE CROSS/BLUE SHIELD | Admitting: Obstetrics and Gynecology

## 2016-12-22 ENCOUNTER — Encounter: Payer: Self-pay | Admitting: Obstetrics and Gynecology

## 2016-12-22 ENCOUNTER — Ambulatory Visit (INDEPENDENT_AMBULATORY_CARE_PROVIDER_SITE_OTHER): Payer: BLUE CROSS/BLUE SHIELD

## 2016-12-22 VITALS — BP 110/66 | Ht 62.0 in | Wt 154.0 lb

## 2016-12-22 DIAGNOSIS — N852 Hypertrophy of uterus: Secondary | ICD-10-CM

## 2016-12-22 DIAGNOSIS — R102 Pelvic and perineal pain: Secondary | ICD-10-CM | POA: Diagnosis not present
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
## 2016-12-22 NOTE — Progress Notes (Signed)
PELVIC US TA/TV: homogeneous anteverted uterus,wnl,EEC 6.2 mm,normal ovaries bilat,no pain during ultrasound,ovaries appear mobile,no free fluid

## 2016-12-24 DIAGNOSIS — M9902 Segmental and somatic dysfunction of thoracic region: Secondary | ICD-10-CM | POA: Diagnosis not present

## 2016-12-24 DIAGNOSIS — M546 Pain in thoracic spine: Secondary | ICD-10-CM | POA: Diagnosis not present

## 2017-01-07 DIAGNOSIS — E109 Type 1 diabetes mellitus without complications: Secondary | ICD-10-CM | POA: Diagnosis not present

## 2017-02-06 DIAGNOSIS — E039 Hypothyroidism, unspecified: Secondary | ICD-10-CM | POA: Diagnosis not present

## 2017-02-06 DIAGNOSIS — E109 Type 1 diabetes mellitus without complications: Secondary | ICD-10-CM | POA: Diagnosis not present

## 2017-02-06 DIAGNOSIS — E7800 Pure hypercholesterolemia, unspecified: Secondary | ICD-10-CM | POA: Diagnosis not present

## 2017-02-13 DIAGNOSIS — E7800 Pure hypercholesterolemia, unspecified: Secondary | ICD-10-CM | POA: Diagnosis not present

## 2017-02-13 DIAGNOSIS — E109 Type 1 diabetes mellitus without complications: Secondary | ICD-10-CM | POA: Diagnosis not present

## 2017-02-13 DIAGNOSIS — E039 Hypothyroidism, unspecified: Secondary | ICD-10-CM | POA: Diagnosis not present

## 2017-02-13 DIAGNOSIS — Z9641 Presence of insulin pump (external) (internal): Secondary | ICD-10-CM | POA: Diagnosis not present

## 2017-05-06 DIAGNOSIS — E109 Type 1 diabetes mellitus without complications: Secondary | ICD-10-CM | POA: Diagnosis not present

## 2017-05-07 ENCOUNTER — Encounter: Payer: Self-pay | Admitting: Family Medicine

## 2017-05-07 ENCOUNTER — Ambulatory Visit (INDEPENDENT_AMBULATORY_CARE_PROVIDER_SITE_OTHER): Payer: BLUE CROSS/BLUE SHIELD | Admitting: Family Medicine

## 2017-05-07 VITALS — BP 134/80 | Temp 97.7°F | Ht 62.0 in | Wt 148.0 lb

## 2017-05-07 DIAGNOSIS — M5432 Sciatica, left side: Secondary | ICD-10-CM

## 2017-05-07 MED ORDER — ETODOLAC 400 MG PO TABS: ORAL_TABLET | ORAL | 0 refills | Status: DC

## 2017-05-07 MED ORDER — HYDROCODONE-ACETAMINOPHEN 5-325 MG PO TABS: ORAL_TABLET | ORAL | 0 refills | Status: DC

## 2017-05-07 NOTE — Patient Instructions (Signed)
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
## 2017-05-11 ENCOUNTER — Encounter: Payer: Self-pay | Admitting: Family Medicine

## 2017-05-11 ENCOUNTER — Other Ambulatory Visit: Payer: Self-pay | Admitting: *Deleted

## 2017-05-11 MED ORDER — HYDROCODONE-ACETAMINOPHEN 7.5-325 MG PO TABS: ORAL_TABLET | ORAL | 0 refills | Status: DC

## 2017-05-11 NOTE — Telephone Encounter (Signed)
Called pt and discussed. rx ready for pickup and will get work excuse ready.

## 2017-05-22 DIAGNOSIS — M9902 Segmental and somatic dysfunction of thoracic region: Secondary | ICD-10-CM | POA: Diagnosis not present

## 2017-05-22 DIAGNOSIS — M5442 Lumbago with sciatica, left side: Secondary | ICD-10-CM | POA: Diagnosis not present

## 2017-05-22 DIAGNOSIS — M9903 Segmental and somatic dysfunction of lumbar region: Secondary | ICD-10-CM | POA: Diagnosis not present

## 2017-05-22 DIAGNOSIS — M546 Pain in thoracic spine: Secondary | ICD-10-CM | POA: Diagnosis not present

## 2017-05-25 DIAGNOSIS — M546 Pain in thoracic spine: Secondary | ICD-10-CM | POA: Diagnosis not present

## 2017-05-25 DIAGNOSIS — M9903 Segmental and somatic dysfunction of lumbar region: Secondary | ICD-10-CM | POA: Diagnosis not present

## 2017-05-25 DIAGNOSIS — M9902 Segmental and somatic dysfunction of thoracic region: Secondary | ICD-10-CM | POA: Diagnosis not present

## 2017-05-25 DIAGNOSIS — M5442 Lumbago with sciatica, left side: Secondary | ICD-10-CM | POA: Diagnosis not present

## 2017-06-01 ENCOUNTER — Encounter: Payer: Self-pay | Admitting: Family Medicine

## 2017-06-01 ENCOUNTER — Ambulatory Visit (INDEPENDENT_AMBULATORY_CARE_PROVIDER_SITE_OTHER): Payer: BLUE CROSS/BLUE SHIELD | Admitting: Family Medicine

## 2017-06-01 VITALS — BP 110/76 | Temp 97.8°F | Ht 62.0 in | Wt 150.0 lb

## 2017-06-01 DIAGNOSIS — M5432 Sciatica, left side: Secondary | ICD-10-CM

## 2017-06-01 DIAGNOSIS — M9903 Segmental and somatic dysfunction of lumbar region: Secondary | ICD-10-CM | POA: Diagnosis not present

## 2017-06-01 DIAGNOSIS — M9902 Segmental and somatic dysfunction of thoracic region: Secondary | ICD-10-CM | POA: Diagnosis not present

## 2017-06-01 DIAGNOSIS — M5442 Lumbago with sciatica, left side: Secondary | ICD-10-CM | POA: Diagnosis not present

## 2017-06-01 DIAGNOSIS — M546 Pain in thoracic spine: Secondary | ICD-10-CM | POA: Diagnosis not present

## 2017-06-01 MED ORDER — ETODOLAC 400 MG PO TABS: ORAL_TABLET | ORAL | 0 refills | Status: DC

## 2017-06-01 MED ORDER — HYDROCODONE-ACETAMINOPHEN 7.5-325 MG PO TABS: ORAL_TABLET | ORAL | 0 refills | Status: DC

## 2017-06-01 NOTE — Progress Notes (Signed)
   Subjective:    Patient ID: [name], female    DOB: 1970/04/01, 47 y.o.   MRN: [id number]  HPI Patient is here today with complaints of left leg pain. She states she was seen three weeks ago for sciatica pain in the left leg.Using Voltaren and hydrocodone, which are helping.She states she is some better,but still having problems.   Pt now knows the week before took a fall and struck left leg and wonders if may be related  Sugars still running not ideal, lowr 200s  Saw chiropractor, and had some adjustments, nd the hip popped and it seemed to help   Some of the pain has gone, slight limp withwalking                 + ++ Review of Systems No headache, no major weight loss or weight gain, no chest pain no back pain abdominal pain no change in bowel habits complete ROS otherwise negative     Objective:   Physical Exam Alert vitals stable, NAD. Blood pressure good on repeat. HEENT normal. Lungs clear. Heart regular rate and rhythm. Left leg straight leg raise still positive though improved compared to 3 weeks ago.       Assessment & Plan:  Impression sciatica clinically improved but not gone.  Still requiring several half doses of narcotic tablet during the day and one time.  Resume lodine.  Continue chiropractic care symptom care discussed if this a couple more weeks before the MRIs since improved

## 2017-06-02 DIAGNOSIS — M546 Pain in thoracic spine: Secondary | ICD-10-CM | POA: Diagnosis not present

## 2017-06-02 DIAGNOSIS — M5442 Lumbago with sciatica, left side: Secondary | ICD-10-CM | POA: Diagnosis not present

## 2017-06-02 DIAGNOSIS — M9902 Segmental and somatic dysfunction of thoracic region: Secondary | ICD-10-CM | POA: Diagnosis not present

## 2017-06-02 DIAGNOSIS — M9903 Segmental and somatic dysfunction of lumbar region: Secondary | ICD-10-CM | POA: Diagnosis not present

## 2017-06-09 DIAGNOSIS — M5442 Lumbago with sciatica, left side: Secondary | ICD-10-CM | POA: Diagnosis not present

## 2017-06-09 DIAGNOSIS — M546 Pain in thoracic spine: Secondary | ICD-10-CM | POA: Diagnosis not present

## 2017-06-09 DIAGNOSIS — M9903 Segmental and somatic dysfunction of lumbar region: Secondary | ICD-10-CM | POA: Diagnosis not present

## 2017-06-09 DIAGNOSIS — M9902 Segmental and somatic dysfunction of thoracic region: Secondary | ICD-10-CM | POA: Diagnosis not present

## 2017-06-16 ENCOUNTER — Ambulatory Visit: Payer: BLUE CROSS/BLUE SHIELD | Admitting: Family Medicine

## 2017-06-19 DIAGNOSIS — E7800 Pure hypercholesterolemia, unspecified: Secondary | ICD-10-CM | POA: Diagnosis not present

## 2017-06-19 DIAGNOSIS — E109 Type 1 diabetes mellitus without complications: Secondary | ICD-10-CM | POA: Diagnosis not present

## 2017-06-19 DIAGNOSIS — E039 Hypothyroidism, unspecified: Secondary | ICD-10-CM | POA: Diagnosis not present

## 2017-06-26 DIAGNOSIS — E039 Hypothyroidism, unspecified: Secondary | ICD-10-CM | POA: Diagnosis not present

## 2017-06-26 DIAGNOSIS — Z9641 Presence of insulin pump (external) (internal): Secondary | ICD-10-CM | POA: Diagnosis not present

## 2017-06-26 DIAGNOSIS — E7800 Pure hypercholesterolemia, unspecified: Secondary | ICD-10-CM | POA: Diagnosis not present

## 2017-06-26 DIAGNOSIS — E109 Type 1 diabetes mellitus without complications: Secondary | ICD-10-CM | POA: Diagnosis not present

## 2017-07-22 DIAGNOSIS — E109 Type 1 diabetes mellitus without complications: Secondary | ICD-10-CM | POA: Diagnosis not present

## 2017-09-03 DIAGNOSIS — E109 Type 1 diabetes mellitus without complications: Secondary | ICD-10-CM | POA: Diagnosis not present

## 2017-09-07 DIAGNOSIS — E039 Hypothyroidism, unspecified: Secondary | ICD-10-CM | POA: Diagnosis not present

## 2017-10-05 DIAGNOSIS — E109 Type 1 diabetes mellitus without complications: Secondary | ICD-10-CM | POA: Diagnosis not present

## 2017-10-27 ENCOUNTER — Other Ambulatory Visit: Payer: Self-pay | Admitting: Family Medicine

## 2017-10-27 DIAGNOSIS — Z1231 Encounter for screening mammogram for malignant neoplasm of breast: Secondary | ICD-10-CM

## 2017-11-02 ENCOUNTER — Ambulatory Visit (HOSPITAL_COMMUNITY)
Admission: RE | Admit: 2017-11-02 | Discharge: 2017-11-02 | Disposition: A | Discharge status: Home / Self Care | Payer: BLUE CROSS/BLUE SHIELD | Source: Ambulatory Visit | Attending: Family Medicine | Admitting: Family Medicine

## 2017-11-02 DIAGNOSIS — Z1231 Encounter for screening mammogram for malignant neoplasm of breast: Secondary | ICD-10-CM | POA: Diagnosis not present

## 2017-11-12 DIAGNOSIS — H43813 Vitreous degeneration, bilateral: Secondary | ICD-10-CM | POA: Diagnosis not present

## 2017-11-12 DIAGNOSIS — E103592 Type 1 diabetes mellitus with proliferative diabetic retinopathy without macular edema, left eye: Secondary | ICD-10-CM | POA: Diagnosis not present

## 2017-11-12 DIAGNOSIS — H3582 Retinal ischemia: Secondary | ICD-10-CM | POA: Diagnosis not present

## 2017-11-12 DIAGNOSIS — E103491 Type 1 diabetes mellitus with severe nonproliferative diabetic retinopathy without macular edema, right eye: Secondary | ICD-10-CM | POA: Diagnosis not present

## 2017-11-13 DIAGNOSIS — E109 Type 1 diabetes mellitus without complications: Secondary | ICD-10-CM | POA: Diagnosis not present

## 2017-12-10 DIAGNOSIS — E109 Type 1 diabetes mellitus without complications: Secondary | ICD-10-CM | POA: Diagnosis not present

## 2017-12-15 DIAGNOSIS — E039 Hypothyroidism, unspecified: Secondary | ICD-10-CM | POA: Diagnosis not present

## 2017-12-15 DIAGNOSIS — E109 Type 1 diabetes mellitus without complications: Secondary | ICD-10-CM | POA: Diagnosis not present

## 2017-12-15 DIAGNOSIS — E7800 Pure hypercholesterolemia, unspecified: Secondary | ICD-10-CM | POA: Diagnosis not present

## 2017-12-25 DIAGNOSIS — E109 Type 1 diabetes mellitus without complications: Secondary | ICD-10-CM | POA: Diagnosis not present

## 2017-12-25 DIAGNOSIS — E7800 Pure hypercholesterolemia, unspecified: Secondary | ICD-10-CM | POA: Diagnosis not present

## 2017-12-25 DIAGNOSIS — E039 Hypothyroidism, unspecified: Secondary | ICD-10-CM | POA: Diagnosis not present

## 2017-12-25 DIAGNOSIS — Z9641 Presence of insulin pump (external) (internal): Secondary | ICD-10-CM | POA: Diagnosis not present

## 2018-01-11 DIAGNOSIS — E109 Type 1 diabetes mellitus without complications: Secondary | ICD-10-CM | POA: Diagnosis not present

## 2018-02-11 DIAGNOSIS — E109 Type 1 diabetes mellitus without complications: Secondary | ICD-10-CM | POA: Diagnosis not present

## 2018-03-15 DIAGNOSIS — E109 Type 1 diabetes mellitus without complications: Secondary | ICD-10-CM | POA: Diagnosis not present

## 2018-03-30 DIAGNOSIS — E109 Type 1 diabetes mellitus without complications: Secondary | ICD-10-CM | POA: Diagnosis not present

## 2018-03-30 DIAGNOSIS — E039 Hypothyroidism, unspecified: Secondary | ICD-10-CM | POA: Diagnosis not present

## 2018-03-30 DIAGNOSIS — E7800 Pure hypercholesterolemia, unspecified: Secondary | ICD-10-CM | POA: Diagnosis not present

## 2018-04-02 DIAGNOSIS — E039 Hypothyroidism, unspecified: Secondary | ICD-10-CM | POA: Diagnosis not present

## 2018-04-02 DIAGNOSIS — E7800 Pure hypercholesterolemia, unspecified: Secondary | ICD-10-CM | POA: Diagnosis not present

## 2018-04-02 DIAGNOSIS — E109 Type 1 diabetes mellitus without complications: Secondary | ICD-10-CM | POA: Diagnosis not present

## 2018-04-02 DIAGNOSIS — Z9641 Presence of insulin pump (external) (internal): Secondary | ICD-10-CM | POA: Diagnosis not present

## 2018-04-14 DIAGNOSIS — E109 Type 1 diabetes mellitus without complications: Secondary | ICD-10-CM | POA: Diagnosis not present

## 2018-05-14 DIAGNOSIS — E109 Type 1 diabetes mellitus without complications: Secondary | ICD-10-CM | POA: Diagnosis not present
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
## 2018-07-22 DIAGNOSIS — E109 Type 1 diabetes mellitus without complications: Secondary | ICD-10-CM | POA: Diagnosis not present

## 2018-07-30 ENCOUNTER — Other Ambulatory Visit: Payer: Self-pay

## 2018-07-30 DIAGNOSIS — Z20822 Contact with and (suspected) exposure to covid-19: Secondary | ICD-10-CM

## 2018-07-30 DIAGNOSIS — R6889 Other general symptoms and signs: Secondary | ICD-10-CM | POA: Diagnosis not present

## 2018-08-03 DIAGNOSIS — E109 Type 1 diabetes mellitus without complications: Secondary | ICD-10-CM | POA: Diagnosis not present

## 2018-08-03 DIAGNOSIS — E039 Hypothyroidism, unspecified: Secondary | ICD-10-CM | POA: Diagnosis not present

## 2018-08-03 DIAGNOSIS — E7800 Pure hypercholesterolemia, unspecified: Secondary | ICD-10-CM | POA: Diagnosis not present

## 2018-08-05 LAB — NOVEL CORONAVIRUS, NAA: SARS-CoV-2, NAA: NOT DETECTED

## 2018-08-06 DIAGNOSIS — E109 Type 1 diabetes mellitus without complications: Secondary | ICD-10-CM | POA: Diagnosis not present

## 2018-08-06 DIAGNOSIS — E7800 Pure hypercholesterolemia, unspecified: Secondary | ICD-10-CM | POA: Diagnosis not present

## 2018-08-06 DIAGNOSIS — Z9641 Presence of insulin pump (external) (internal): Secondary | ICD-10-CM | POA: Diagnosis not present

## 2018-08-06 DIAGNOSIS — E039 Hypothyroidism, unspecified: Secondary | ICD-10-CM | POA: Diagnosis not present

## 2018-08-26 DIAGNOSIS — H43813 Vitreous degeneration, bilateral: Secondary | ICD-10-CM | POA: Diagnosis not present

## 2018-08-26 DIAGNOSIS — E103592 Type 1 diabetes mellitus with proliferative diabetic retinopathy without macular edema, left eye: Secondary | ICD-10-CM | POA: Diagnosis not present

## 2018-08-26 DIAGNOSIS — H34211 Partial retinal artery occlusion, right eye: Secondary | ICD-10-CM | POA: Diagnosis not present

## 2018-08-26 DIAGNOSIS — E103491 Type 1 diabetes mellitus with severe nonproliferative diabetic retinopathy without macular edema, right eye: Secondary | ICD-10-CM | POA: Diagnosis not present

## 2018-09-14 DIAGNOSIS — E109 Type 1 diabetes mellitus without complications: Secondary | ICD-10-CM | POA: Diagnosis not present

## 2018-09-16 ENCOUNTER — Other Ambulatory Visit: Payer: Self-pay

## 2018-09-16 DIAGNOSIS — Z20822 Contact with and (suspected) exposure to covid-19: Secondary | ICD-10-CM

## 2018-09-16 DIAGNOSIS — R6889 Other general symptoms and signs: Secondary | ICD-10-CM | POA: Diagnosis not present

## 2018-09-18 LAB — NOVEL CORONAVIRUS, NAA: SARS-CoV-2, NAA: NOT DETECTED

## 2018-11-15 DIAGNOSIS — E109 Type 1 diabetes mellitus without complications: Secondary | ICD-10-CM | POA: Diagnosis not present

## 2018-12-10 DIAGNOSIS — E039 Hypothyroidism, unspecified: Secondary | ICD-10-CM | POA: Diagnosis not present

## 2018-12-10 DIAGNOSIS — E7800 Pure hypercholesterolemia, unspecified: Secondary | ICD-10-CM | POA: Diagnosis not present

## 2018-12-10 DIAGNOSIS — Z9641 Presence of insulin pump (external) (internal): Secondary | ICD-10-CM | POA: Diagnosis not present

## 2018-12-10 DIAGNOSIS — E109 Type 1 diabetes mellitus without complications: Secondary | ICD-10-CM | POA: Diagnosis not present

## 2018-12-17 DIAGNOSIS — E109 Type 1 diabetes mellitus without complications: Secondary | ICD-10-CM | POA: Diagnosis not present

## 2019-01-16 ENCOUNTER — Ambulatory Visit
Admission: EM | Admit: 2019-01-16 | Discharge: 2019-01-16 | Disposition: A | Discharge status: Home / Self Care | Payer: BC Managed Care – PPO | Attending: Emergency Medicine | Admitting: Emergency Medicine

## 2019-01-16 ENCOUNTER — Other Ambulatory Visit: Payer: Self-pay

## 2019-01-16 DIAGNOSIS — R5383 Other fatigue: Secondary | ICD-10-CM

## 2019-01-16 DIAGNOSIS — Z20828 Contact with and (suspected) exposure to other viral communicable diseases: Secondary | ICD-10-CM | POA: Diagnosis not present

## 2019-01-16 DIAGNOSIS — R519 Headache, unspecified: Secondary | ICD-10-CM | POA: Diagnosis not present

## 2019-01-16 DIAGNOSIS — Z20822 Contact with and (suspected) exposure to covid-19: Secondary | ICD-10-CM

## 2019-01-16 DIAGNOSIS — J069 Acute upper respiratory infection, unspecified: Secondary | ICD-10-CM

## 2019-01-16 DIAGNOSIS — R05 Cough: Secondary | ICD-10-CM

## 2019-01-16 MED ORDER — BENZONATATE 100 MG PO CAPS: 100.0000 mg | ORAL_CAPSULE | Freq: Three times a day (TID) | ORAL | 0 refills | Status: DC

## 2019-01-16 NOTE — Discharge Instructions (Signed)

## 2019-01-16 NOTE — ED Provider Notes (Signed)
[facility]   [id number] 01/16/19 Arrival Time: 0843   CC: COVID symptoms  SUBJECTIVE: History from: patient.  [name] is a 48 y.o. female who presents with headache, dry cough, and fatigue x 4 days ago.  Denies sick exposure to COVID, flu or strep.  Denies recent travel.  Has tried OTC medications with relief.  Reports previous symptoms in the past.  Denies chills, rhinorrhea, sore throat, SOB, wheezing, chest pain, nausea, changes in bowel or bladder habits.    ROS: As per HPI.  All other pertinent ROS negative.     Past Medical History:  Diagnosis Date  . Diabetes (HCC)   . Thyroid disease    Past Surgical History:  Procedure Laterality Date  . caesarean     No Known Allergies No current facility-administered medications on file prior to encounter.   Current Outpatient Medications on File Prior to Encounter  Medication Sig Dispense Refill  . insulin aspart (NOVOLOG) 100 UNIT/ML injection by Pump Prime route continuous.    . Insulin Human (INSULIN PUMP) SOLN Inject into the skin continuous.     Marland Kitchen levothyroxine (SYNTHROID, LEVOTHROID) 25 MCG tablet Take 25 mcg by mouth daily before breakfast.    . Multiple Vitamin (MULTIVITAMIN WITH MINERALS) TABS tablet Take 1 tablet by mouth daily.    . [DISCONTINUED] simvastatin (ZOCOR) 10 MG tablet Take 10 mg by mouth daily. Dr. Talmage Nap     Social History   Socioeconomic History  . Marital status: Married    Spouse name: Not on file  . Number of children: Not on file  . Years of education: Not on file  . Highest education level: Not on file  Occupational History  . Not on file  Tobacco Use  . Smoking status: Never Smoker  . Smokeless tobacco: Never Used  Substance and Sexual Activity  . Alcohol use: Yes  . Drug use: No  . Sexual activity: Yes  Other Topics Concern  . Not on file  Social History Narrative  . Not on file   Social Determinants of Health   Financial Resource Strain:   . Difficulty of  Paying Living Expenses: Not on file  Food Insecurity:   . Worried About Programme researcher, broadcasting/film/video in the Last Year: Not on file  . Ran Out of Food in the Last Year: Not on file  Transportation Needs:   . Lack of Transportation (Medical): Not on file  . Lack of Transportation (Non-Medical): Not on file  Physical Activity:   . Days of Exercise per Week: Not on file  . Minutes of Exercise per Session: Not on file  Stress:   . Feeling of Stress : Not on file  Social Connections:   . Frequency of Communication with Friends and Family: Not on file  . Frequency of Social Gatherings with Friends and Family: Not on file  . Attends Religious Services: Not on file  . Active Member of Clubs or Organizations: Not on file  . Attends Banker Meetings: Not on file  . Marital Status: Not on file  Intimate Partner Violence:   . Fear of Current or Ex-Partner: Not on file  . Emotionally Abused: Not on file  . Physically Abused: Not on file  . Sexually Abused: Not on file   Family History  Problem Relation Age of Onset  . Diabetes Other     OBJECTIVE:  Vitals:   01/16/19 0857  BP: 123/85  Pulse: (!) 109  Resp: 18  Temp:  98.9 F (37.2 C)  SpO2: 97%     General appearance: alert; appears mildly fatigued, but nontoxic; speaking in full sentences and tolerating own secretions HEENT: NCAT; Ears: EACs clear, TMs pearly gray; Eyes: PERRL.  EOM grossly intact. Nose: nares patent without rhinorrhea, Throat: oropharynx clear, tonsils non erythematous or enlarged, uvula midline  Neck: supple without LAD Lungs: unlabored respirations, symmetrical air entry; cough: absent; no respiratory distress; CTAB Heart: regular rate and rhythm.   Skin: warm and dry Psychological: alert and cooperative; normal mood and affect   ASSESSMENT & PLAN:  1. Suspected COVID-19 virus infection   2. Viral URI with cough     Meds ordered this encounter  Medications  . benzonatate (TESSALON) 100 MG capsule      Sig: Take 1 capsule (100 mg total) by mouth every 8 (eight) hours.    Dispense:  21 capsule    Refill:  0    Order Specific Question:   Supervising Provider    Answer:   Raylene Everts [[id number]]   COVID testing ordered.  It will take between 5-7 days for test results.  Someone will contact you regarding abnormal results.    In the meantime: You should remain isolated in your home for 10 days from symptom onset AND greater than 72 hours after symptoms resolution (absence of fever without the use of fever-reducing medication and improvement in respiratory symptoms), whichever is longer Get plenty of rest and push fluids Tessalon Perles prescribed for cough Use OTC zyrtec for nasal congestion, runny nose, and/or sore throat Use OTC flonase for nasal congestion and runny nose Use medications daily for symptom relief Use OTC medications like ibuprofen or tylenol as needed fever or pain Call or go to the ED if you have any new or worsening symptoms such as fever, worsening cough, shortness of breath, chest tightness, chest pain, turning blue, changes in mental status, etc...   Reviewed expectations re: course of current medical issues. Questions answered. Outlined signs and symptoms indicating need for more acute intervention. Patient verbalized understanding. After Visit Summary given.         Lestine Box, PA-C 01/16/19 [phone]

## 2019-01-16 NOTE — ED Triage Notes (Signed)
Pt presents with c/o headache cough, and fatigue that began on Wednesday and worsened on Friday

## 2019-01-17 LAB — NOVEL CORONAVIRUS, NAA: SARS-CoV-2, NAA: NOT DETECTED

## 2019-01-20 ENCOUNTER — Ambulatory Visit: Payer: BC Managed Care – PPO | Attending: Internal Medicine

## 2019-01-20 ENCOUNTER — Other Ambulatory Visit: Payer: Self-pay

## 2019-01-20 DIAGNOSIS — Z20828 Contact with and (suspected) exposure to other viral communicable diseases: Secondary | ICD-10-CM | POA: Diagnosis not present

## 2019-01-20 DIAGNOSIS — Z20822 Contact with and (suspected) exposure to covid-19: Secondary | ICD-10-CM

## 2019-01-22 LAB — NOVEL CORONAVIRUS, NAA: SARS-CoV-2, NAA: NOT DETECTED

## 2019-01-29 DIAGNOSIS — E109 Type 1 diabetes mellitus without complications: Secondary | ICD-10-CM | POA: Diagnosis not present

## 2019-01-31 DIAGNOSIS — H43822 Vitreomacular adhesion, left eye: Secondary | ICD-10-CM | POA: Diagnosis not present

## 2019-01-31 DIAGNOSIS — E103491 Type 1 diabetes mellitus with severe nonproliferative diabetic retinopathy without macular edema, right eye: Secondary | ICD-10-CM | POA: Diagnosis not present

## 2019-01-31 DIAGNOSIS — H34211 Partial retinal artery occlusion, right eye: Secondary | ICD-10-CM | POA: Diagnosis not present

## 2019-01-31 DIAGNOSIS — E103592 Type 1 diabetes mellitus with proliferative diabetic retinopathy without macular edema, left eye: Secondary | ICD-10-CM | POA: Diagnosis not present

## 2019-02-23 ENCOUNTER — Encounter: Payer: Self-pay | Admitting: Family Medicine

## 2019-03-01 DIAGNOSIS — E109 Type 1 diabetes mellitus without complications: Secondary | ICD-10-CM | POA: Diagnosis not present

## 2019-03-09 DIAGNOSIS — E039 Hypothyroidism, unspecified: Secondary | ICD-10-CM | POA: Diagnosis not present

## 2019-03-17 DIAGNOSIS — R61 Generalized hyperhidrosis: Secondary | ICD-10-CM | POA: Diagnosis not present

## 2019-03-17 DIAGNOSIS — E7800 Pure hypercholesterolemia, unspecified: Secondary | ICD-10-CM | POA: Diagnosis not present

## 2019-03-17 DIAGNOSIS — E039 Hypothyroidism, unspecified: Secondary | ICD-10-CM | POA: Diagnosis not present

## 2019-03-17 DIAGNOSIS — M791 Myalgia, unspecified site: Secondary | ICD-10-CM | POA: Diagnosis not present

## 2019-03-17 DIAGNOSIS — Z9641 Presence of insulin pump (external) (internal): Secondary | ICD-10-CM | POA: Diagnosis not present

## 2019-03-17 DIAGNOSIS — M2550 Pain in unspecified joint: Secondary | ICD-10-CM | POA: Diagnosis not present

## 2019-03-17 DIAGNOSIS — E109 Type 1 diabetes mellitus without complications: Secondary | ICD-10-CM | POA: Diagnosis not present

## 2019-03-30 DIAGNOSIS — D8989 Other specified disorders involving the immune mechanism, not elsewhere classified: Secondary | ICD-10-CM | POA: Diagnosis not present

## 2019-03-30 DIAGNOSIS — M791 Myalgia, unspecified site: Secondary | ICD-10-CM | POA: Diagnosis not present

## 2019-03-30 DIAGNOSIS — M2550 Pain in unspecified joint: Secondary | ICD-10-CM | POA: Diagnosis not present

## 2019-03-30 DIAGNOSIS — M359 Systemic involvement of connective tissue, unspecified: Secondary | ICD-10-CM | POA: Diagnosis not present

## 2019-03-30 DIAGNOSIS — R768 Other specified abnormal immunological findings in serum: Secondary | ICD-10-CM | POA: Diagnosis not present

## 2019-04-07 DIAGNOSIS — M791 Myalgia, unspecified site: Secondary | ICD-10-CM | POA: Diagnosis not present

## 2019-04-07 DIAGNOSIS — M359 Systemic involvement of connective tissue, unspecified: Secondary | ICD-10-CM | POA: Diagnosis not present

## 2019-04-07 DIAGNOSIS — M2550 Pain in unspecified joint: Secondary | ICD-10-CM | POA: Diagnosis not present

## 2019-04-07 DIAGNOSIS — R768 Other specified abnormal immunological findings in serum: Secondary | ICD-10-CM | POA: Diagnosis not present

## 2019-04-29 DIAGNOSIS — E109 Type 1 diabetes mellitus without complications: Secondary | ICD-10-CM | POA: Diagnosis not present

## 2019-05-30 DIAGNOSIS — H43822 Vitreomacular adhesion, left eye: Secondary | ICD-10-CM | POA: Diagnosis not present

## 2019-05-30 DIAGNOSIS — E103593 Type 1 diabetes mellitus with proliferative diabetic retinopathy without macular edema, bilateral: Secondary | ICD-10-CM | POA: Diagnosis not present

## 2019-05-30 DIAGNOSIS — H43813 Vitreous degeneration, bilateral: Secondary | ICD-10-CM | POA: Diagnosis not present

## 2019-05-30 DIAGNOSIS — H3582 Retinal ischemia: Secondary | ICD-10-CM | POA: Diagnosis not present

## 2019-06-03 DIAGNOSIS — E7800 Pure hypercholesterolemia, unspecified: Secondary | ICD-10-CM | POA: Diagnosis not present

## 2019-06-03 DIAGNOSIS — E109 Type 1 diabetes mellitus without complications: Secondary | ICD-10-CM | POA: Diagnosis not present

## 2019-06-03 DIAGNOSIS — E039 Hypothyroidism, unspecified: Secondary | ICD-10-CM | POA: Diagnosis not present

## 2019-06-10 DIAGNOSIS — E109 Type 1 diabetes mellitus without complications: Secondary | ICD-10-CM | POA: Diagnosis not present

## 2019-06-10 DIAGNOSIS — Z9641 Presence of insulin pump (external) (internal): Secondary | ICD-10-CM | POA: Diagnosis not present

## 2019-06-10 DIAGNOSIS — E7800 Pure hypercholesterolemia, unspecified: Secondary | ICD-10-CM | POA: Diagnosis not present

## 2019-06-10 DIAGNOSIS — E039 Hypothyroidism, unspecified: Secondary | ICD-10-CM | POA: Diagnosis not present

## 2019-07-11 DIAGNOSIS — H4311 Vitreous hemorrhage, right eye: Secondary | ICD-10-CM | POA: Diagnosis not present

## 2019-07-11 DIAGNOSIS — E103513 Type 1 diabetes mellitus with proliferative diabetic retinopathy with macular edema, bilateral: Secondary | ICD-10-CM | POA: Diagnosis not present

## 2019-07-11 DIAGNOSIS — H43822 Vitreomacular adhesion, left eye: Secondary | ICD-10-CM | POA: Diagnosis not present

## 2019-07-11 DIAGNOSIS — H43391 Other vitreous opacities, right eye: Secondary | ICD-10-CM | POA: Diagnosis not present

## 2019-07-21 DIAGNOSIS — H43822 Vitreomacular adhesion, left eye: Secondary | ICD-10-CM | POA: Diagnosis not present

## 2019-07-21 DIAGNOSIS — H43813 Vitreous degeneration, bilateral: Secondary | ICD-10-CM | POA: Diagnosis not present

## 2019-07-21 DIAGNOSIS — H4311 Vitreous hemorrhage, right eye: Secondary | ICD-10-CM | POA: Diagnosis not present

## 2019-07-21 DIAGNOSIS — E103593 Type 1 diabetes mellitus with proliferative diabetic retinopathy without macular edema, bilateral: Secondary | ICD-10-CM | POA: Diagnosis not present

## 2019-08-03 DIAGNOSIS — E103591 Type 1 diabetes mellitus with proliferative diabetic retinopathy without macular edema, right eye: Secondary | ICD-10-CM | POA: Diagnosis not present

## 2019-08-03 DIAGNOSIS — E109 Type 1 diabetes mellitus without complications: Secondary | ICD-10-CM | POA: Diagnosis not present

## 2019-09-14 DIAGNOSIS — H4311 Vitreous hemorrhage, right eye: Secondary | ICD-10-CM | POA: Diagnosis not present

## 2019-09-14 DIAGNOSIS — E103593 Type 1 diabetes mellitus with proliferative diabetic retinopathy without macular edema, bilateral: Secondary | ICD-10-CM | POA: Diagnosis not present

## 2019-09-14 DIAGNOSIS — H43822 Vitreomacular adhesion, left eye: Secondary | ICD-10-CM | POA: Diagnosis not present

## 2019-09-14 DIAGNOSIS — H3582 Retinal ischemia: Secondary | ICD-10-CM | POA: Diagnosis not present

## 2019-09-18 ENCOUNTER — Ambulatory Visit
Admission: EM | Admit: 2019-09-18 | Discharge: 2019-09-18 | Disposition: A | Discharge status: Home / Self Care | Payer: BC Managed Care – PPO | Attending: Emergency Medicine | Admitting: Emergency Medicine

## 2019-09-18 ENCOUNTER — Other Ambulatory Visit: Payer: Self-pay

## 2019-09-18 ENCOUNTER — Encounter: Payer: Self-pay | Admitting: Emergency Medicine

## 2019-09-18 DIAGNOSIS — Z1152 Encounter for screening for COVID-19: Secondary | ICD-10-CM | POA: Diagnosis not present

## 2019-09-18 DIAGNOSIS — J069 Acute upper respiratory infection, unspecified: Secondary | ICD-10-CM

## 2019-09-18 DIAGNOSIS — Z20822 Contact with and (suspected) exposure to covid-19: Secondary | ICD-10-CM | POA: Diagnosis not present

## 2019-09-18 HISTORY — DX: Pure hypercholesterolemia, unspecified: E78.00

## 2019-09-18 MED ORDER — BENZONATATE 100 MG PO CAPS: 100.0000 mg | ORAL_CAPSULE | Freq: Three times a day (TID) | ORAL | 0 refills | Status: DC

## 2019-09-18 MED ORDER — CETIRIZINE HCL 10 MG PO TABS: 10.0000 mg | ORAL_TABLET | Freq: Every day | ORAL | 0 refills | Status: DC

## 2019-09-18 MED ORDER — FLUTICASONE PROPIONATE 50 MCG/ACT NA SUSP: 1.0000 | Freq: Every day | NASAL | 0 refills | Status: DC

## 2019-09-18 NOTE — Discharge Instructions (Addendum)
COVID testing ordered.  It will take between 2-7 days for test results.  Someone will contact you regarding abnormal results.    In the meantime: You should remain isolated in your home for 10 days from symptom onset AND greater than 24 hours after symptoms resolution (absence of fever without the use of fever-reducing medication and improvement in respiratory symptoms), whichever is longer Get plenty of rest and push fluids Tessalon Perles prescribed for cough Zyrtec for nasal congestion, runny nose, and/or sore throat Flonase for nasal congestion and runny nose Use medications daily for symptom relief Use OTC medications like ibuprofen or tylenol as needed fever or pain Call or go to the ED if you have any new or worsening symptoms such as fever, worsening cough, shortness of breath, chest tightness, chest pain, turning blue, changes in mental status, etc...  

## 2019-09-18 NOTE — ED Triage Notes (Signed)
Headache, body aches, ear pain, mild sore throat, cough and sneezing.

## 2019-09-18 NOTE — ED Notes (Signed)
Patient states she went to a concert two weeks ago and is experiencing Covid symptoms.

## 2019-09-18 NOTE — ED Provider Notes (Signed)
[facility]   [id number] 09/18/19 Arrival Time: 1113   CC: COVID symptoms  SUBJECTIVE: History from: patient.  [name] is a 49 y.o. female who presented to the urgent care with a complaint of cough, sneezing, sore throat, headache and body ache for the past few days.  Denies sick exposure to COVID, flu or strep.  Denies recent travel.  Has tried OTC medication without relief.  Denies similar symptoms in the past.  Denies previous symptoms in the past.   Denies fever, chills, fatigue, sinus pain, rhinorrhea,  SOB, wheezing, chest pain, nausea, changes in bowel or bladder habits.    ROS: As per HPI.  All other pertinent ROS negative.      Past Medical History:  Diagnosis Date  . Diabetes (HCC)   . High cholesterol   . Thyroid disease    Past Surgical History:  Procedure Laterality Date  . caesarean     No Known Allergies No current facility-administered medications on file prior to encounter.   Current Outpatient Medications on File Prior to Encounter  Medication Sig Dispense Refill  . insulin aspart (NOVOLOG) 100 UNIT/ML injection by Pump Prime route continuous.    . Insulin Human (INSULIN PUMP) SOLN Inject into the skin continuous.     Marland Kitchen levothyroxine (SYNTHROID, LEVOTHROID) 25 MCG tablet Take 25 mcg by mouth daily before breakfast.    . Multiple Vitamin (MULTIVITAMIN WITH MINERALS) TABS tablet Take 1 tablet by mouth daily.    . [DISCONTINUED] simvastatin (ZOCOR) 10 MG tablet Take 10 mg by mouth daily. Dr. Talmage Nap     Social History   Socioeconomic History  . Marital status: Married    Spouse name: Not on file  . Number of children: Not on file  . Years of education: Not on file  . Highest education level: Not on file  Occupational History  . Not on file  Tobacco Use  . Smoking status: Never Smoker  . Smokeless tobacco: Never Used  Substance and Sexual Activity  . Alcohol use: Yes  . Drug use: No  . Sexual activity: Yes  Other Topics Concern    . Not on file  Social History Narrative  . Not on file   Social Determinants of Health   Financial Resource Strain:   . Difficulty of Paying Living Expenses: Not on file  Food Insecurity:   . Worried About Programme researcher, broadcasting/film/video in the Last Year: Not on file  . Ran Out of Food in the Last Year: Not on file  Transportation Needs:   . Lack of Transportation (Medical): Not on file  . Lack of Transportation (Non-Medical): Not on file  Physical Activity:   . Days of Exercise per Week: Not on file  . Minutes of Exercise per Session: Not on file  Stress:   . Feeling of Stress : Not on file  Social Connections:   . Frequency of Communication with Friends and Family: Not on file  . Frequency of Social Gatherings with Friends and Family: Not on file  . Attends Religious Services: Not on file  . Active Member of Clubs or Organizations: Not on file  . Attends Banker Meetings: Not on file  . Marital Status: Not on file  Intimate Partner Violence:   . Fear of Current or Ex-Partner: Not on file  . Emotionally Abused: Not on file  . Physically Abused: Not on file  . Sexually Abused: Not on file   Family History  Problem Relation Age  of Onset  . Diabetes Other     OBJECTIVE:  Vitals:   09/18/19 1129 09/18/19 1142  BP: 116/88   Pulse: 93   Resp: 16   Temp: 98.3 F (36.8 C)   TempSrc: Oral   SpO2: 98%   Weight:  155 lb (70.3 kg)  Height:  5\' 2"  (1.575 m)     General appearance: alert; appears fatigued, but nontoxic; speaking in full sentences and tolerating own secretions HEENT: NCAT; Ears: EACs clear, TMs pearly gray; Eyes: PERRL.  EOM grossly intact. Sinuses: nontender; Nose: nares patent without rhinorrhea, Throat: oropharynx clear, tonsils non erythematous or enlarged, uvula midline  Neck: supple without LAD Lungs: unlabored respirations, symmetrical air entry; cough: mild; no respiratory distress; CTAB Heart: regular rate and rhythm.  Radial pulses 2+  symmetrical bilaterally Skin: warm and dry Psychological: alert and cooperative; normal mood and affect  LABS:  No results found for this or any previous visit (from the past 24 hour(s)).   ASSESSMENT & PLAN:  1. Viral URI with cough   2. Encounter for screening for COVID-19     Meds ordered this encounter  Medications  . fluticasone (FLONASE) 50 MCG/ACT nasal spray    Sig: Place 1 spray into both nostrils daily for 14 days.    Dispense:  16 g    Refill:  0  . cetirizine (ZYRTEC ALLERGY) 10 MG tablet    Sig: Take 1 tablet (10 mg total) by mouth daily.    Dispense:  30 tablet    Refill:  0  . benzonatate (TESSALON) 100 MG capsule    Sig: Take 1 capsule (100 mg total) by mouth every 8 (eight) hours.    Dispense:  30 capsule    Refill:  0    Discharge Instructions    COVID testing ordered.  It will take between 2-7 days for test results.  Someone will contact you regarding abnormal results.    In the meantime: You should remain isolated in your home for 10 days from symptom onset AND greater than 24  hours after symptoms resolution (absence of fever without the use of fever-reducing medication and improvement in respiratory symptoms), whichever is longer Get plenty of rest and push fluids Tessalon Perles prescribed for cough Zyrtec for nasal congestion, runny nose, and/or sore throat Flonase for nasal congestion and runny nose Use medications daily for symptom relief Use OTC medications like ibuprofen or tylenol as needed fever or pain Call or go to the ED if you have any new or worsening symptoms such as fever, worsening cough, shortness of breath, chest tightness, chest pain, turning blue, changes in mental status, etc...   Reviewed expectations re: course of current medical issues. Questions answered. Outlined signs and symptoms indicating need for more acute intervention. Patient verbalized understanding. After Visit Summary given.      Note: This document was  prepared using Dragon voice recognition software and may include unintentional dictation errors.    , FNP 09/18/19 1215

## 2019-09-19 DIAGNOSIS — E109 Type 1 diabetes mellitus without complications: Secondary | ICD-10-CM | POA: Diagnosis not present

## 2019-09-19 LAB — NOVEL CORONAVIRUS, NAA: SARS-CoV-2, NAA: NOT DETECTED

## 2019-10-05 DIAGNOSIS — E109 Type 1 diabetes mellitus without complications: Secondary | ICD-10-CM | POA: Diagnosis not present

## 2019-10-05 DIAGNOSIS — E7800 Pure hypercholesterolemia, unspecified: Secondary | ICD-10-CM | POA: Diagnosis not present

## 2019-10-05 DIAGNOSIS — E559 Vitamin D deficiency, unspecified: Secondary | ICD-10-CM | POA: Diagnosis not present

## 2019-10-05 DIAGNOSIS — E039 Hypothyroidism, unspecified: Secondary | ICD-10-CM | POA: Diagnosis not present

## 2019-10-05 DIAGNOSIS — Z9641 Presence of insulin pump (external) (internal): Secondary | ICD-10-CM | POA: Diagnosis not present

## 2019-10-12 DIAGNOSIS — E109 Type 1 diabetes mellitus without complications: Secondary | ICD-10-CM | POA: Diagnosis not present

## 2019-10-12 DIAGNOSIS — E039 Hypothyroidism, unspecified: Secondary | ICD-10-CM | POA: Diagnosis not present

## 2019-10-12 DIAGNOSIS — E11319 Type 2 diabetes mellitus with unspecified diabetic retinopathy without macular edema: Secondary | ICD-10-CM | POA: Diagnosis not present

## 2019-10-12 DIAGNOSIS — Z9641 Presence of insulin pump (external) (internal): Secondary | ICD-10-CM | POA: Diagnosis not present

## 2019-10-28 DIAGNOSIS — E109 Type 1 diabetes mellitus without complications: Secondary | ICD-10-CM | POA: Diagnosis not present

## 2019-11-29 DIAGNOSIS — M2550 Pain in unspecified joint: Secondary | ICD-10-CM | POA: Diagnosis not present

## 2019-11-29 DIAGNOSIS — M791 Myalgia, unspecified site: Secondary | ICD-10-CM | POA: Diagnosis not present

## 2019-11-29 DIAGNOSIS — M359 Systemic involvement of connective tissue, unspecified: Secondary | ICD-10-CM | POA: Diagnosis not present

## 2019-11-29 DIAGNOSIS — R768 Other specified abnormal immunological findings in serum: Secondary | ICD-10-CM | POA: Diagnosis not present

## 2019-12-20 ENCOUNTER — Ambulatory Visit (INDEPENDENT_AMBULATORY_CARE_PROVIDER_SITE_OTHER): Payer: BC Managed Care – PPO | Admitting: Family Medicine

## 2019-12-20 ENCOUNTER — Encounter: Payer: Self-pay | Admitting: Family Medicine

## 2019-12-20 VITALS — BP 130/80 | HR 88 | Temp 97.0°F | Wt 159.6 lb

## 2019-12-20 DIAGNOSIS — F32A Depression, unspecified: Secondary | ICD-10-CM | POA: Diagnosis not present

## 2019-12-20 DIAGNOSIS — M797 Fibromyalgia: Secondary | ICD-10-CM | POA: Diagnosis not present

## 2019-12-20 MED ORDER — DULOXETINE HCL 30 MG PO CPEP: 30.0000 mg | ORAL_CAPSULE | Freq: Every day | ORAL | 1 refills | Status: DC

## 2019-12-20 MED ORDER — DICLOFENAC SODIUM 75 MG PO TBEC: 75.0000 mg | DELAYED_RELEASE_TABLET | Freq: Two times a day (BID) | ORAL | 1 refills | Status: DC

## 2019-12-20 NOTE — Patient Instructions (Signed)
Myofascial Pain Syndrome and Fibromyalgia Myofascial pain syndrome and fibromyalgia are both pain disorders. This pain may be felt mainly in your muscles.  Myofascial pain syndrome: ? Always has tender points in the muscle that will cause pain when pressed (trigger points). The pain may come and go. ? Usually affects your neck, upper back, and shoulder areas. The pain often radiates into your arms and hands.  Fibromyalgia: ? Has muscle pains and tenderness that come and go. ? Is often associated with fatigue and sleep problems. ? Has trigger points. ? Tends to be long-lasting (chronic), but is not life-threatening. Fibromyalgia and myofascial pain syndrome are not the same. However, they often occur together. If you have both conditions, each can make the other worse. Both are common and can cause enough pain and fatigue to make day-to-day activities difficult. Both can be hard to diagnose because their symptoms are common in many other conditions. What are the causes? The exact causes of these conditions are not known. What increases the risk? You are more likely to develop this condition if:  You have a family history of the condition.  You have certain triggers, such as: ? Spine disorders. ? An injury (trauma) or other physical stressors. ? Being under a lot of stress. ? Medical conditions such as osteoarthritis, rheumatoid arthritis, or lupus. What are the signs or symptoms? Fibromyalgia The main symptom of fibromyalgia is widespread pain and tenderness in your muscles. Pain is sometimes described as stabbing, shooting, or burning. You may also have:  Tingling or numbness.  Sleep problems and fatigue.  Problems with attention and concentration (fibro fog). Other symptoms may include:  Bowel and bladder problems.  Headaches.  Visual problems.  Problems with odors and noises.  Depression or mood changes.  Painful menstrual periods (dysmenorrhea).  Dry skin or  eyes. These symptoms can vary over time. Myofascial pain syndrome Symptoms of myofascial pain syndrome include:  Tight, ropy bands of muscle.  Uncomfortable sensations in muscle areas. These may include aching, cramping, burning, numbness, tingling, and weakness.  Difficulty moving certain parts of the body freely (poor range of motion). How is this diagnosed? This condition may be diagnosed by your symptoms and medical history. You will also have a physical exam. In general:  Fibromyalgia is diagnosed if you have pain, fatigue, and other symptoms for more than 3 months, and symptoms cannot be explained by another condition.  Myofascial pain syndrome is diagnosed if you have trigger points in your muscles, and those trigger points are tender and cause pain elsewhere in your body (referred pain). How is this treated? Treatment for these conditions depends on the type that you have.  For fibromyalgia: ? Pain medicines, such as NSAIDs. ? Medicines for treating depression. ? Medicines for treating seizures. ? Medicines that relax the muscles.  For myofascial pain: ? Pain medicines, such as NSAIDs. ? Cooling and stretching of muscles. ? Trigger point injections. ? Sound wave (ultrasound) treatments to stimulate muscles. Treating these conditions often requires a team of health care providers. These may include:  Your primary care provider.  Physical therapist.  Complementary health care providers, such as massage therapists or acupuncturists.  Psychiatrist for cognitive behavioral therapy. Follow these instructions at home: Medicines  Take over-the-counter and prescription medicines only as told by your health care provider.  Do not drive or use heavy machinery while taking prescription pain medicine.  If you are taking prescription pain medicine, take actions to prevent or treat constipation. Your health care   provider may recommend that you: ? Drink enough fluid to keep  your urine pale yellow. ? Eat foods that are high in fiber, such as fresh fruits and vegetables, whole grains, and beans. ? Limit foods that are high in fat and processed sugars, such as fried or sweet foods. ? Take an over-the-counter or prescription medicine for constipation. Lifestyle   Exercise as directed by your health care provider or physical therapist.  Practice relaxation techniques to control your stress. You may want to try: ? Biofeedback. ? Visual imagery. ? Hypnosis. ? Muscle relaxation. ? Yoga. ? Meditation.  Maintain a healthy lifestyle. This includes eating a healthy diet and getting enough sleep.  Do not use any products that contain nicotine or tobacco, such as cigarettes and e-cigarettes. If you need help quitting, ask your health care provider. General instructions  Talk to your health care provider about complementary treatments, such as acupuncture or massage.  Consider joining a support group with others who are diagnosed with this condition.  Do not do activities that stress or strain your muscles. This includes repetitive motions and heavy lifting.  Keep all follow-up visits as told by your health care provider. This is important. Where to find more information  National Fibromyalgia Association: www.fmaware.org  Arthritis Foundation: www.arthritis.org  American Chronic Pain Association: www.theacpa.org Contact a health care provider if:  You have new symptoms.  Your symptoms get worse or your pain is severe.  You have side effects from your medicines.  You have trouble sleeping.  Your condition is causing depression or anxiety. Summary  Myofascial pain syndrome and fibromyalgia are pain disorders.  Myofascial pain syndrome has tender points in the muscle that will cause pain when pressed (trigger points). Fibromyalgia also has muscle pains and tenderness that come and go, but this condition is often associated with fatigue and sleep  disturbances.  Fibromyalgia and myofascial pain syndrome are not the same but often occur together, causing pain and fatigue that make day-to-day activities difficult.  Treatment for fibromyalgia includes taking medicines to relax the muscles and medicines for pain, depression, or seizures. Treatment for myofascial pain syndrome includes taking medicines for pain, cooling and stretching of muscles, and injecting medicines into trigger points.  Follow your health care provider's instructions for taking medicines and maintaining a healthy lifestyle. This information is not intended to replace advice given to you by your health care provider. Make sure you discuss any questions you have with your health care provider. Document Revised: 04/30/2018 Document Reviewed: 01/21/2017 Elsevier Patient Education  2020 Elsevier Inc.  

## 2019-12-20 NOTE — Progress Notes (Signed)
Patient ID: [name], female    DOB: 08/14/1970, 49 y.o.   MRN: [id number]   Chief Complaint  Patient presents with  . Fibromyalgia    Patient recently diagnosed with fibromyalgia. Patient reports pain all over, more predominantly legs and lower back. Taking ibuprofen.    Subjective:    HPI  Pt with new dx of fibromyalgia by rheumatologist. Pt seen today with husband. Pain mostly in legs and lower back.  Only been taking ibuprofen. Last visit in back in 2019. Seeing Dr. Janyth Contes with rheum.  Pt stating that he stated pt should f/u with pcp, that he doesn't treat fibromyalgia. Pt pain started in 1/21. Did testing for lupus and was negative.  Has fibromyalgia, and the tenderness in the classic tender points. Worsening pain.   No new falls.  Not doing much exercising or physically active.  Was having RLS and worked with diclofenac in the past. Then stopped it after a while.  Pain in lower back that is sharp. Sharp pain and achiness in the thighs/IT band. Pain in shoulders with palpation. Hurting to touch on the biceps.  Pt and husband noting pt is spilling drinks and jerking and rt hand.   Spasm in hand and then it jerks. Bad headaches. Chest pain yesterday intermittently. Feeling dizziness/off balance.  Depression-  Started 1/21. Pt is type 1 diabetic. Going ot bed early 7pm and sleeping a lot.  Medical History [name] has a past medical history of Diabetes (HCC), High cholesterol, and Thyroid disease.   Outpatient Encounter Medications as of 12/20/2019  Medication Sig  . OVER THE COUNTER MEDICATION Vitamin b  . cetirizine (ZYRTEC ALLERGY) 10 MG tablet Take 1 tablet (10 mg total) by mouth daily.  . diclofenac (VOLTAREN) 75 MG EC tablet Take 1 tablet (75 mg total) by mouth 2 (two) times daily.  . DULoxetine (CYMBALTA) 30 MG capsule Take 1 capsule (30 mg total) by mouth daily.  . fluticasone (FLONASE) 50 MCG/ACT nasal spray Place 1 spray into both nostrils  daily for 14 days.  . insulin aspart (NOVOLOG) 100 UNIT/ML injection by Pump Prime route continuous.  . Insulin Human (INSULIN PUMP) SOLN Inject into the skin continuous.   Marland Kitchen levothyroxine (SYNTHROID, LEVOTHROID) 25 MCG tablet Take 25 mcg by mouth daily before breakfast.  . Multiple Vitamin (MULTIVITAMIN WITH MINERALS) TABS tablet Take 1 tablet by mouth daily.  . rosuvastatin (CRESTOR) 5 MG tablet Take 5 mg by mouth once a week.  . Vitamin D, Ergocalciferol, (DRISDOL) 1.25 MG (50000 UNIT) CAPS capsule Take 50,000 Units by mouth once a week.  . [DISCONTINUED] benzonatate (TESSALON) 100 MG capsule Take 1 capsule (100 mg total) by mouth every 8 (eight) hours.  . [DISCONTINUED] simvastatin (ZOCOR) 10 MG tablet Take 10 mg by mouth daily. Dr. Talmage Nap   No facility-administered encounter medications on file as of 12/20/2019.     Review of Systems  Constitutional: Negative for chills and fever.  HENT: Negative for congestion, rhinorrhea and sore throat.   Respiratory: Negative for cough, shortness of breath and wheezing.   Cardiovascular: Negative for chest pain and leg swelling.  Gastrointestinal: Negative for abdominal pain, diarrhea, nausea and vomiting.  Genitourinary: Negative for dysuria and frequency.  Musculoskeletal: Positive for arthralgias (multiple joint pains) and back pain.  Skin: Negative for rash.  Neurological: Positive for dizziness. Negative for weakness and headaches.  Psychiatric/Behavioral: Positive for dysphoric mood.     Vitals BP 130/80   Pulse 88   Temp (!) 97 F (  36.1 C)   Wt 159 lb 9.6 oz (72.4 kg)   SpO2 100%   BMI 29.19 kg/m   Objective:   Physical Exam Vitals and nursing note reviewed.  Constitutional:      Appearance: Normal appearance.  HENT:     Head: Normocephalic and atraumatic.     Nose: Nose normal.     Mouth/Throat:     Mouth: Mucous membranes are moist.     Pharynx: Oropharynx is clear.  Eyes:     Extraocular Movements: Extraocular  movements intact.     Conjunctiva/sclera: Conjunctivae normal.     Pupils: Pupils are equal, round, and reactive to light.  Cardiovascular:     Rate and Rhythm: Normal rate and regular rhythm.     Pulses: Normal pulses.     Heart sounds: Normal heart sounds.  Pulmonary:     Effort: Pulmonary effort is normal.     Breath sounds: Normal breath sounds. No wheezing, rhonchi or rales.  Musculoskeletal:        General: Tenderness (ttp over bilateral IT band) present. Normal range of motion.     Right lower leg: No edema.     Left lower leg: No edema.     Comments: +ttp over lumbar paraspinal area. No lumbar or thoracic spinous process tenderness.  Dec rom with flexion. Neg SLR bilaterally.  Normal MS LE bilaterally and normal sensation.   Skin:    General: Skin is warm and dry.     Findings: No lesion or rash.  Neurological:     General: No focal deficit present.     Mental Status: She is alert and oriented to person, place, and time.  Psychiatric:        Mood and Affect: Mood normal.        Behavior: Behavior normal.      Assessment and Plan   1. Fibromyalgia affecting multiple sites - diclofenac (VOLTAREN) 75 MG EC tablet; Take 1 tablet (75 mg total) by mouth 2 (two) times daily.  Dispense: 60 tablet; Refill: 1 - DULoxetine (CYMBALTA) 30 MG capsule; Take 1 capsule (30 mg total) by mouth daily.  Dispense: 30 capsule; Refill: 1  2. Depression, unspecified depression type - DULoxetine (CYMBALTA) 30 MG capsule; Take 1 capsule (30 mg total) by mouth daily.  Dispense: 30 capsule; Refill: 1    Recommending starting 30mg  cymbalta. Also start on diclofenac. Cont to exercise and stretch.   F/u 4-6 wks.

## 2019-12-22 DIAGNOSIS — H3582 Retinal ischemia: Secondary | ICD-10-CM | POA: Diagnosis not present

## 2019-12-22 DIAGNOSIS — H43822 Vitreomacular adhesion, left eye: Secondary | ICD-10-CM | POA: Diagnosis not present

## 2019-12-22 DIAGNOSIS — E103593 Type 1 diabetes mellitus with proliferative diabetic retinopathy without macular edema, bilateral: Secondary | ICD-10-CM | POA: Diagnosis not present

## 2019-12-22 DIAGNOSIS — H4311 Vitreous hemorrhage, right eye: Secondary | ICD-10-CM | POA: Diagnosis not present

## 2019-12-29 DIAGNOSIS — E109 Type 1 diabetes mellitus without complications: Secondary | ICD-10-CM | POA: Diagnosis not present

## 2020-01-11 DIAGNOSIS — Z9641 Presence of insulin pump (external) (internal): Secondary | ICD-10-CM | POA: Diagnosis not present

## 2020-01-11 DIAGNOSIS — E11319 Type 2 diabetes mellitus with unspecified diabetic retinopathy without macular edema: Secondary | ICD-10-CM | POA: Diagnosis not present

## 2020-01-11 DIAGNOSIS — E039 Hypothyroidism, unspecified: Secondary | ICD-10-CM | POA: Diagnosis not present

## 2020-01-11 DIAGNOSIS — R768 Other specified abnormal immunological findings in serum: Secondary | ICD-10-CM | POA: Diagnosis not present

## 2020-01-31 ENCOUNTER — Encounter: Payer: Self-pay | Admitting: Family Medicine

## 2020-01-31 ENCOUNTER — Ambulatory Visit (INDEPENDENT_AMBULATORY_CARE_PROVIDER_SITE_OTHER): Payer: BC Managed Care – PPO | Admitting: Family Medicine

## 2020-01-31 ENCOUNTER — Other Ambulatory Visit: Payer: Self-pay

## 2020-01-31 DIAGNOSIS — F32A Depression, unspecified: Secondary | ICD-10-CM

## 2020-01-31 DIAGNOSIS — M797 Fibromyalgia: Secondary | ICD-10-CM | POA: Diagnosis not present

## 2020-01-31 MED ORDER — DICLOFENAC SODIUM 75 MG PO TBEC: 75.0000 mg | DELAYED_RELEASE_TABLET | Freq: Two times a day (BID) | ORAL | 2 refills | Status: DC

## 2020-01-31 MED ORDER — DULOXETINE HCL 30 MG PO CPEP: 30.0000 mg | ORAL_CAPSULE | Freq: Every day | ORAL | 1 refills | Status: DC

## 2020-01-31 NOTE — Progress Notes (Signed)
Pt here for 6 month follow up. Pt states she is doing well and the meds (Voltaren and Cymbalta) are doing well for her.     Patient ID: [name], female    DOB: 06-02-1970, 50 y.o.   MRN: [id number]   Chief Complaint  Patient presents with  . Fibromyalgia   Subjective:    HPI  Pt seen for fibromyalgia follow up.   Last visit we started pt on cymbalta and voltaren.  Taking cymbalta at night.  Waking up at night and for 1 wk feeling dizzy, now resolved.  voltaren 2x per day. Feeling like her joint pains have improved. Feels some pains in lateral thighs occ, but overall less days of pain since starting medications.  Seeing endo and they are watching her lab.  Dr. Lisabeth Devoid in [place]. They are filling cholesterol, thyroid and dm meds.   Medical History [name] has a past medical history of Diabetes (HCC), High cholesterol, and Thyroid disease.   Outpatient Encounter Medications as of 01/31/2020  Medication Sig  . cetirizine (ZYRTEC ALLERGY) 10 MG tablet Take 1 tablet (10 mg total) by mouth daily.  . insulin aspart (NOVOLOG) 100 UNIT/ML injection by Pump Prime route continuous.  . Insulin Human (INSULIN PUMP) SOLN Inject into the skin continuous.   Marland Kitchen levothyroxine (SYNTHROID, LEVOTHROID) 25 MCG tablet Take 25 mcg by mouth daily before breakfast.  . Multiple Vitamin (MULTIVITAMIN WITH MINERALS) TABS tablet Take 1 tablet by mouth daily.  Marland Kitchen OVER THE COUNTER MEDICATION Vitamin b  . rosuvastatin (CRESTOR) 5 MG tablet Take 5 mg by mouth once a week.  . Vitamin D, Ergocalciferol, (DRISDOL) 1.25 MG (50000 UNIT) CAPS capsule Take 50,000 Units by mouth once a week.  . [DISCONTINUED] diclofenac (VOLTAREN) 75 MG EC tablet Take 1 tablet (75 mg total) by mouth 2 (two) times daily.  . [DISCONTINUED] DULoxetine (CYMBALTA) 30 MG capsule Take 1 capsule (30 mg total) by mouth daily.  . diclofenac (VOLTAREN) 75 MG EC tablet Take 1 tablet (75 mg total) by mouth 2 (two) times daily.  .  DULoxetine (CYMBALTA) 30 MG capsule Take 1 capsule (30 mg total) by mouth daily.  . fluticasone (FLONASE) 50 MCG/ACT nasal spray Place 1 spray into both nostrils daily for 14 days.  . [DISCONTINUED] simvastatin (ZOCOR) 10 MG tablet Take 10 mg by mouth daily. Dr. Talmage Nap   No facility-administered encounter medications on file as of 01/31/2020.     Review of Systems  Constitutional: Negative for chills and fever.  HENT: Negative for congestion, rhinorrhea and sore throat.   Respiratory: Negative for cough, shortness of breath and wheezing.   Cardiovascular: Negative for chest pain and leg swelling.  Gastrointestinal: Negative for abdominal pain, diarrhea, nausea and vomiting.  Genitourinary: Negative for dysuria and frequency.  Musculoskeletal: Negative for arthralgias and back pain.  Skin: Negative for rash.  Neurological: Negative for dizziness, weakness and headaches.     Vitals BP 122/76   Pulse 90   Temp (!) 97 F (36.1 C)   Ht 5\' 2"  (1.575 m)   Wt 163 lb 3.2 oz (74 kg)   SpO2 99%   BMI 29.85 kg/m   Objective:   Physical Exam Vitals and nursing note reviewed.  Constitutional:      General: She is not in acute distress.    Appearance: Normal appearance.  HENT:     Head: Normocephalic and atraumatic.  Cardiovascular:     Rate and Rhythm: Normal rate and regular rhythm.  Pulses: Normal pulses.     Heart sounds: Normal heart sounds.  Pulmonary:     Effort: Pulmonary effort is normal.     Breath sounds: Normal breath sounds. No wheezing, rhonchi or rales.  Musculoskeletal:        General: Normal range of motion.     Right lower leg: No edema.     Left lower leg: No edema.  Skin:    General: Skin is warm and dry.     Findings: No lesion or rash.  Neurological:     General: No focal deficit present.     Mental Status: She is alert and oriented to person, place, and time.     Cranial Nerves: No cranial nerve deficit.  Psychiatric:        Mood and Affect: Mood  normal.        Behavior: Behavior normal.        Thought Content: Thought content normal.        Judgment: Judgment normal.      Assessment and Plan   1. Fibromyalgia affecting multiple sites - DULoxetine (CYMBALTA) 30 MG capsule; Take 1 capsule (30 mg total) by mouth daily.  Dispense: 90 capsule; Refill: 1 - diclofenac (VOLTAREN) 75 MG EC tablet; Take 1 tablet (75 mg total) by mouth 2 (two) times daily.  Dispense: 60 tablet; Refill: 2  2. Depression, unspecified depression type - DULoxetine (CYMBALTA) 30 MG capsule; Take 1 capsule (30 mg total) by mouth daily.  Dispense: 90 capsule; Refill: 1     Pt feeling better with fibromyalgia pain since starting the cymbalta and voltaren.  Less days of feeling sore and fatigued.  Feels her depression is slightly improving since not in as much pain.  Pt to call back if feeling down or worsening symptoms.  F/u 23mo or prn.

## 2020-03-08 DIAGNOSIS — E109 Type 1 diabetes mellitus without complications: Secondary | ICD-10-CM | POA: Diagnosis not present

## 2020-03-22 DIAGNOSIS — L821 Other seborrheic keratosis: Secondary | ICD-10-CM | POA: Diagnosis not present

## 2020-03-22 DIAGNOSIS — L814 Other melanin hyperpigmentation: Secondary | ICD-10-CM | POA: Diagnosis not present

## 2020-03-22 DIAGNOSIS — D229 Melanocytic nevi, unspecified: Secondary | ICD-10-CM | POA: Diagnosis not present

## 2020-04-20 ENCOUNTER — Other Ambulatory Visit: Payer: Self-pay | Admitting: Family Medicine

## 2020-04-20 DIAGNOSIS — M797 Fibromyalgia: Secondary | ICD-10-CM

## 2020-04-26 DIAGNOSIS — H3582 Retinal ischemia: Secondary | ICD-10-CM | POA: Diagnosis not present

## 2020-04-26 DIAGNOSIS — H43822 Vitreomacular adhesion, left eye: Secondary | ICD-10-CM | POA: Diagnosis not present

## 2020-04-26 DIAGNOSIS — H4311 Vitreous hemorrhage, right eye: Secondary | ICD-10-CM | POA: Diagnosis not present

## 2020-04-26 DIAGNOSIS — E103593 Type 1 diabetes mellitus with proliferative diabetic retinopathy without macular edema, bilateral: Secondary | ICD-10-CM | POA: Diagnosis not present

## 2020-05-14 DIAGNOSIS — E7800 Pure hypercholesterolemia, unspecified: Secondary | ICD-10-CM | POA: Diagnosis not present

## 2020-05-14 DIAGNOSIS — E039 Hypothyroidism, unspecified: Secondary | ICD-10-CM | POA: Diagnosis not present

## 2020-05-14 DIAGNOSIS — Z9641 Presence of insulin pump (external) (internal): Secondary | ICD-10-CM | POA: Diagnosis not present

## 2020-05-14 DIAGNOSIS — E109 Type 1 diabetes mellitus without complications: Secondary | ICD-10-CM | POA: Diagnosis not present

## 2020-06-05 DIAGNOSIS — E109 Type 1 diabetes mellitus without complications: Secondary | ICD-10-CM | POA: Diagnosis not present

## 2020-06-22 ENCOUNTER — Other Ambulatory Visit: Payer: Self-pay | Admitting: Nurse Practitioner

## 2020-06-22 DIAGNOSIS — M797 Fibromyalgia: Secondary | ICD-10-CM

## 2020-06-26 DIAGNOSIS — H4311 Vitreous hemorrhage, right eye: Secondary | ICD-10-CM | POA: Diagnosis not present

## 2020-06-26 DIAGNOSIS — H43822 Vitreomacular adhesion, left eye: Secondary | ICD-10-CM | POA: Diagnosis not present

## 2020-06-26 DIAGNOSIS — E103593 Type 1 diabetes mellitus with proliferative diabetic retinopathy without macular edema, bilateral: Secondary | ICD-10-CM | POA: Diagnosis not present

## 2020-06-26 DIAGNOSIS — H43812 Vitreous degeneration, left eye: Secondary | ICD-10-CM | POA: Diagnosis not present

## 2020-07-02 DIAGNOSIS — H26491 Other secondary cataract, right eye: Secondary | ICD-10-CM | POA: Diagnosis not present

## 2020-07-02 DIAGNOSIS — E113511 Type 2 diabetes mellitus with proliferative diabetic retinopathy with macular edema, right eye: Secondary | ICD-10-CM | POA: Diagnosis not present

## 2020-07-02 DIAGNOSIS — H4311 Vitreous hemorrhage, right eye: Secondary | ICD-10-CM | POA: Diagnosis not present

## 2020-07-03 DIAGNOSIS — E113591 Type 2 diabetes mellitus with proliferative diabetic retinopathy without macular edema, right eye: Secondary | ICD-10-CM | POA: Diagnosis not present

## 2020-07-11 DIAGNOSIS — E103591 Type 1 diabetes mellitus with proliferative diabetic retinopathy without macular edema, right eye: Secondary | ICD-10-CM | POA: Diagnosis not present

## 2020-07-11 DIAGNOSIS — H4311 Vitreous hemorrhage, right eye: Secondary | ICD-10-CM | POA: Diagnosis not present

## 2020-07-30 ENCOUNTER — Other Ambulatory Visit: Payer: Self-pay | Admitting: Nurse Practitioner

## 2020-07-30 ENCOUNTER — Encounter: Payer: Self-pay | Admitting: Family Medicine

## 2020-07-30 ENCOUNTER — Other Ambulatory Visit: Payer: Self-pay

## 2020-07-30 ENCOUNTER — Other Ambulatory Visit: Payer: Self-pay | Admitting: Family Medicine

## 2020-07-30 ENCOUNTER — Ambulatory Visit (INDEPENDENT_AMBULATORY_CARE_PROVIDER_SITE_OTHER): Payer: BC Managed Care – PPO | Admitting: Family Medicine

## 2020-07-30 VITALS — BP 117/84 | HR 84 | Temp 97.2°F | Wt 156.8 lb

## 2020-07-30 DIAGNOSIS — G4459 Other complicated headache syndrome: Secondary | ICD-10-CM | POA: Diagnosis not present

## 2020-07-30 DIAGNOSIS — M797 Fibromyalgia: Secondary | ICD-10-CM | POA: Diagnosis not present

## 2020-07-30 DIAGNOSIS — F32A Depression, unspecified: Secondary | ICD-10-CM | POA: Diagnosis not present

## 2020-07-30 MED ORDER — SUMATRIPTAN SUCCINATE 50 MG PO TABS: 50.0000 mg | ORAL_TABLET | ORAL | 0 refills | Status: DC | PRN

## 2020-07-30 NOTE — Progress Notes (Signed)
Patient ID: [name], female    DOB: 11/15/1970, 50 y.o.   MRN: [id number]   Chief Complaint  Patient presents with   Fibromyalgia   Subjective:    HPI Pt here for follow up on fibromyalgia.  Pt having headaches over right eye only. Comes on about 3 pm daily. Pt does take Tylenol, no relief.  Pt had to have right eye surgery due to bleeding behind eye. Pt states she woke up one morning and vision was impaired. Had wavy lines in front of eyes and then vision went black.    Had covid and had some bleeding behind the rt eye, and needed eye surgery.  Seeing them next week for f/u. Vision improved.  Comes over the rt frontal area headache.  Now they are bad now and daily around 3pm. No vision changes at the time of headaches. Working well with duloxetine and on diclofenac. Getting light sensitivity with the headache. No n/v   Medical History [name] has a past medical history of Diabetes (HCC), High cholesterol, and Thyroid disease.   Outpatient Encounter Medications as of 07/30/2020  Medication Sig   insulin aspart (NOVOLOG) 100 UNIT/ML injection by Pump Prime route continuous.   Insulin Human (INSULIN PUMP) SOLN Inject into the skin continuous.    levothyroxine (SYNTHROID, LEVOTHROID) 25 MCG tablet Take 25 mcg by mouth daily before breakfast.   Multiple Vitamin (MULTIVITAMIN WITH MINERALS) TABS tablet Take 1 tablet by mouth daily.   OVER THE COUNTER MEDICATION Vitamin b   rosuvastatin (CRESTOR) 5 MG tablet Take 5 mg by mouth once a week.   SUMAtriptan (IMITREX) 50 MG tablet Take 1 tablet (50 mg total) by mouth every 2 (two) hours as needed for migraine. May repeat in 2 hours if headache persists or recurs.   Vitamin D, Ergocalciferol, (DRISDOL) 1.25 MG (50000 UNIT) CAPS capsule Take 50,000 Units by mouth once a week.   [DISCONTINUED] diclofenac (VOLTAREN) 75 MG EC tablet TAKE (1) TABLET BY MOUTH TWICE DAILY.   [DISCONTINUED] DULoxetine (CYMBALTA) 30 MG capsule Take  1 capsule (30 mg total) by mouth daily.   [DISCONTINUED] cetirizine (ZYRTEC ALLERGY) 10 MG tablet Take 1 tablet (10 mg total) by mouth daily.   [DISCONTINUED] fluticasone (FLONASE) 50 MCG/ACT nasal spray Place 1 spray into both nostrils daily for 14 days.   [DISCONTINUED] simvastatin (ZOCOR) 10 MG tablet Take 10 mg by mouth daily. Dr. Talmage Nap   No facility-administered encounter medications on file as of 07/30/2020.     Review of Systems  Constitutional:  Negative for chills and fever.  HENT:  Negative for congestion, rhinorrhea and sore throat.   Eyes:  Positive for visual disturbance.  Respiratory:  Negative for cough, shortness of breath and wheezing.   Cardiovascular:  Negative for chest pain and leg swelling.  Gastrointestinal:  Negative for abdominal pain, diarrhea, nausea and vomiting.  Genitourinary:  Negative for dysuria and frequency.  Musculoskeletal:  Negative for arthralgias and back pain.  Skin:  Negative for rash.  Neurological:  Positive for headaches. Negative for dizziness and weakness.    Vitals BP 117/84   Pulse 84   Temp (!) 97.2 F (36.2 C)   Wt 156 lb 12.8 oz (71.1 kg)   SpO2 91%   BMI 28.68 kg/m   Objective:   Physical Exam Vitals and nursing note reviewed.  Constitutional:      Appearance: Normal appearance.  HENT:     Head: Normocephalic and atraumatic.     Nose:  Nose normal.     Mouth/Throat:     Mouth: Mucous membranes are moist.     Pharynx: Oropharynx is clear.  Eyes:     Extraocular Movements: Extraocular movements intact.     Conjunctiva/sclera: Conjunctivae normal.     Pupils: Pupils are equal, round, and reactive to light.  Cardiovascular:     Rate and Rhythm: Normal rate and regular rhythm.     Pulses: Normal pulses.     Heart sounds: Normal heart sounds.  Pulmonary:     Effort: Pulmonary effort is normal.     Breath sounds: Normal breath sounds. No wheezing, rhonchi or rales.  Musculoskeletal:        General: Normal range of  motion.     Right lower leg: No edema.     Left lower leg: No edema.  Skin:    General: Skin is warm and dry.     Findings: No lesion or rash.  Neurological:     General: No focal deficit present.     Mental Status: She is alert and oriented to person, place, and time.  Psychiatric:        Mood and Affect: Mood normal.        Behavior: Behavior normal.     Assessment and Plan   1. Fibromyalgia affecting multiple sites  2. Depression, unspecified depression type  3. Other complicated headache syndrome - SUMAtriptan (IMITREX) 50 MG tablet; Take 1 tablet (50 mg total) by mouth every 2 (two) hours as needed for migraine. May repeat in 2 hours if headache persists or recurs.  Dispense: 10 tablet; Refill: 0   Fibromyalgia- stable. Cont cymbalta and diclofenac.  Depression- stable. Cont with cymbalta.  Headache- could be migraines- will try tx with imitrex.   Return in about 6 months (around 01/30/2021) for f/u fibromyalgia.im

## 2020-08-08 DIAGNOSIS — E103591 Type 1 diabetes mellitus with proliferative diabetic retinopathy without macular edema, right eye: Secondary | ICD-10-CM | POA: Diagnosis not present

## 2020-08-12 DIAGNOSIS — G4459 Other complicated headache syndrome: Secondary | ICD-10-CM | POA: Insufficient documentation

## 2020-08-12 DIAGNOSIS — M797 Fibromyalgia: Secondary | ICD-10-CM | POA: Insufficient documentation

## 2020-08-12 DIAGNOSIS — F4323 Adjustment disorder with mixed anxiety and depressed mood: Secondary | ICD-10-CM | POA: Insufficient documentation

## 2020-08-12 DIAGNOSIS — F32A Depression, unspecified: Secondary | ICD-10-CM | POA: Insufficient documentation

## 2020-08-21 DIAGNOSIS — E109 Type 1 diabetes mellitus without complications: Secondary | ICD-10-CM | POA: Diagnosis not present

## 2020-09-05 DIAGNOSIS — M9902 Segmental and somatic dysfunction of thoracic region: Secondary | ICD-10-CM | POA: Diagnosis not present

## 2020-09-05 DIAGNOSIS — M546 Pain in thoracic spine: Secondary | ICD-10-CM | POA: Diagnosis not present

## 2020-09-05 DIAGNOSIS — M542 Cervicalgia: Secondary | ICD-10-CM | POA: Diagnosis not present

## 2020-09-05 DIAGNOSIS — M9901 Segmental and somatic dysfunction of cervical region: Secondary | ICD-10-CM | POA: Diagnosis not present

## 2020-09-12 DIAGNOSIS — E109 Type 1 diabetes mellitus without complications: Secondary | ICD-10-CM | POA: Diagnosis not present

## 2020-09-12 DIAGNOSIS — E7800 Pure hypercholesterolemia, unspecified: Secondary | ICD-10-CM | POA: Diagnosis not present

## 2020-09-12 DIAGNOSIS — E039 Hypothyroidism, unspecified: Secondary | ICD-10-CM | POA: Diagnosis not present

## 2020-09-12 DIAGNOSIS — E559 Vitamin D deficiency, unspecified: Secondary | ICD-10-CM | POA: Diagnosis not present

## 2020-09-17 DIAGNOSIS — Z9641 Presence of insulin pump (external) (internal): Secondary | ICD-10-CM | POA: Diagnosis not present

## 2020-09-17 DIAGNOSIS — E109 Type 1 diabetes mellitus without complications: Secondary | ICD-10-CM | POA: Diagnosis not present

## 2020-09-17 DIAGNOSIS — E11319 Type 2 diabetes mellitus with unspecified diabetic retinopathy without macular edema: Secondary | ICD-10-CM | POA: Diagnosis not present

## 2020-09-17 DIAGNOSIS — E039 Hypothyroidism, unspecified: Secondary | ICD-10-CM | POA: Diagnosis not present

## 2020-09-17 DIAGNOSIS — E1065 Type 1 diabetes mellitus with hyperglycemia: Secondary | ICD-10-CM | POA: Diagnosis not present

## 2020-12-06 ENCOUNTER — Other Ambulatory Visit: Payer: Self-pay

## 2020-12-06 ENCOUNTER — Encounter: Payer: Self-pay | Admitting: Nurse Practitioner

## 2020-12-06 ENCOUNTER — Ambulatory Visit (INDEPENDENT_AMBULATORY_CARE_PROVIDER_SITE_OTHER): Payer: BC Managed Care – PPO | Admitting: Nurse Practitioner

## 2020-12-06 VITALS — BP 123/79 | HR 77 | Ht 62.0 in | Wt 156.8 lb

## 2020-12-06 DIAGNOSIS — L608 Other nail disorders: Secondary | ICD-10-CM

## 2020-12-06 NOTE — Progress Notes (Signed)
   Subjective:    Patient ID: [name], female    DOB: 1970/05/17, 50 y.o.   MRN: [id number]  HPI  Patient arrives to have right great toenail checked. The nail is turning dark-almost black. No know injury. Patient unsure of how long the nail has been discolored because she normal wears nail polish during warmer weather. Patient states that she took off her nail polish about a week ago and noticed that the nail was bluish, black in color. Patient denies any pain. Denies having neuropathy and is followed closely by [facility] for DM type 1.   Review of Systems  Skin:  Positive for color change.       Color change to R great toe nail  Neurological:  Negative for numbness.       Objective:   Physical Exam Constitutional:      Appearance: Normal appearance.  Cardiovascular:     Rate and Rhythm: Normal rate and regular rhythm.  Pulmonary:     Effort: Pulmonary effort is normal. No respiratory distress.     Breath sounds: Normal breath sounds. No wheezing.  Skin:    General: Skin is warm.     Comments: R great toenail a dark bluish-green to black color at the base of the nail. The nail is thickened and slants medially and and can be slightly raised up revealing the nailbed. The nailbed is discolored; brown to black in color.  Neurological:     General: No focal deficit present.     Mental Status: She is alert and oriented to person, place, and time.     Sensory: No sensory deficit.          Assessment & Plan:  1. Toenail deformity - Fungal infection suspected. - Referred to podiatry for possible removal of the toe nail and further evaluation.  - Ambulatory referral to Podiatry  - RTC for annual wellness exam

## 2020-12-12 ENCOUNTER — Other Ambulatory Visit: Payer: Self-pay

## 2020-12-12 ENCOUNTER — Encounter: Payer: Self-pay | Admitting: Podiatry

## 2020-12-12 ENCOUNTER — Ambulatory Visit (INDEPENDENT_AMBULATORY_CARE_PROVIDER_SITE_OTHER): Payer: BC Managed Care – PPO | Admitting: Podiatry

## 2020-12-12 DIAGNOSIS — L600 Ingrowing nail: Secondary | ICD-10-CM

## 2020-12-12 NOTE — Patient Instructions (Signed)

## 2020-12-12 NOTE — Progress Notes (Signed)
Subjective:   Patient ID: Automatic Data, female   DOB: 50 y.o.   MRN: [id number]   HPI Patient presents with severely damaged thickened right big toenail that sore and makes walking shoe gear difficult.  States that she is tried wider shoes she is tried modalities without relief and that she is always had trouble with that and the left 1 is also thickened but not like the right 1.  Patient does not smoke likes to be active   Review of Systems  All other systems reviewed and are negative.      Objective:  Physical Exam Vitals and nursing note reviewed.  Constitutional:      Appearance: She is well-developed.  Pulmonary:     Effort: Pulmonary effort is normal.  Musculoskeletal:        General: Normal range of motion.  Skin:    General: Skin is warm.  Neurological:     Mental Status: She is alert.    Neurovascular status intact muscle strength found to be adequate range of motion adequate patient is a long-term diabetic but under excellent control with A1c around 6.  Patient has a thickened deformed right hallux nail and has good digital perfusion with mild changes left not to the same degree and has good digital perfusion well oriented x3     Assessment:  Severe damaged right hallux nailbed thickened and loosened from the bed and painful and partial damage left with minimal discomfort     Plan:  H&P reviewed conditions discussed nail disease.  I recommended nail removal and I do think given the long-term pathology thickness and pain it would be best to be done permanently and patient wants this done.  I explained procedure risk and patient signed consent form after reviewing and today I infiltrated the right hallux 60 mg like Marcaine mixture sterile prep done and using sterile instrumentation remove the hallux nail exposed matrix applied phenol 5 applications 30 seconds followed by alcohol lavage sterile dressing gave instructions on soaks and leave dressing on 24 hours but take  it off earlier if any throbbing were to occur.  Encouraged her to call any questions or concerns which may arise

## 2021-01-10 ENCOUNTER — Telehealth: Payer: BC Managed Care – PPO | Admitting: Physician Assistant

## 2021-01-10 DIAGNOSIS — J069 Acute upper respiratory infection, unspecified: Secondary | ICD-10-CM

## 2021-01-10 MED ORDER — IPRATROPIUM BROMIDE 0.03 % NA SOLN: 2.0000 | Freq: Two times a day (BID) | NASAL | 0 refills | Status: DC

## 2021-01-10 MED ORDER — BENZONATATE 100 MG PO CAPS: 100.0000 mg | ORAL_CAPSULE | Freq: Three times a day (TID) | ORAL | 0 refills | Status: DC | PRN

## 2021-01-10 NOTE — Progress Notes (Signed)

## 2021-01-28 DIAGNOSIS — E1065 Type 1 diabetes mellitus with hyperglycemia: Secondary | ICD-10-CM | POA: Diagnosis not present

## 2021-01-28 DIAGNOSIS — E109 Type 1 diabetes mellitus without complications: Secondary | ICD-10-CM | POA: Diagnosis not present

## 2021-01-28 DIAGNOSIS — Z9641 Presence of insulin pump (external) (internal): Secondary | ICD-10-CM | POA: Diagnosis not present

## 2021-01-28 DIAGNOSIS — E039 Hypothyroidism, unspecified: Secondary | ICD-10-CM | POA: Diagnosis not present

## 2021-02-12 ENCOUNTER — Encounter: Payer: Self-pay | Admitting: Nurse Practitioner

## 2021-02-12 ENCOUNTER — Other Ambulatory Visit: Payer: Self-pay

## 2021-02-12 ENCOUNTER — Ambulatory Visit (INDEPENDENT_AMBULATORY_CARE_PROVIDER_SITE_OTHER): Payer: BC Managed Care – PPO | Admitting: Nurse Practitioner

## 2021-02-12 VITALS — BP 132/74 | HR 93 | Temp 97.4°F | Ht 62.0 in | Wt 156.0 lb

## 2021-02-12 DIAGNOSIS — G43009 Migraine without aura, not intractable, without status migrainosus: Secondary | ICD-10-CM | POA: Diagnosis not present

## 2021-02-12 MED ORDER — TOPIRAMATE 25 MG PO TABS: 25.0000 mg | ORAL_TABLET | Freq: Two times a day (BID) | ORAL | 0 refills | Status: DC

## 2021-02-12 NOTE — Progress Notes (Signed)
° °  Subjective:    Patient ID: [name], female    DOB: 07-14-1970, 51 y.o.   MRN: [id number]  HPI Patient here for right-sided headaches for 3 weeks.  Patient reports having headaches daily for the past 3 weeks.  Patient states that headaches began around her right forehead and radiate to the right side of her head.  Patient has history of chronic headaches.  Patient states that after COVID she would have headaches about 2 times a week.  Headaches were managed with sumatriptan. However recently patient states she has had headaches every day.  Patient reports light sensitivity with her headaches.  Patient has been taking Tylenol for headaches with some relief.  Patient currently taking Voltaren for fibromyalgia.  Patient unsure if she has relief from Voltaren.  Patient states that headaches are relieved by rest and closing her eyes.  Patient denies any numbness, tingling, weakness, facial drooping, worst headache of her life, vision changes, or auras.  Review of Systems  Neurological:  Positive for headaches.      Objective:   Physical Exam Constitutional:      Appearance: She is well-developed and normal weight.  HENT:     Head: Normocephalic and atraumatic.  Eyes:     General: No visual field deficit. Cardiovascular:     Rate and Rhythm: Normal rate and regular rhythm.     Heart sounds: Normal heart sounds. No murmur heard. Pulmonary:     Effort: Pulmonary effort is normal. No respiratory distress.     Breath sounds: No wheezing.  Musculoskeletal:        General: Normal range of motion.  Skin:    General: Skin is warm.  Neurological:     Mental Status: She is alert and oriented to person, place, and time.     Cranial Nerves: No cranial nerve deficit or facial asymmetry.     Sensory: No sensory deficit.     Motor: No weakness.     Gait: Gait normal.  Psychiatric:        Mood and Affect: Mood normal.        Behavior: Behavior normal.          Assessment & Plan:    1. Migraine without aura and without status migrainosus, not intractable -Likely migraines without aura. -May continue taking Tylenol as needed - We will start topiramate 25 mg daily.  Patient can increase to topiramate 25 mg twice a day after 2 weeks if headaches have not decreased in frequency. - If migraines have not decreased in frequency after 4 weeks, topiramate dose can be increased to 100 mg/day -We will evaluate side effects of topiramate in detail.  Patient to contact clinic if she experiences any possible side effects -If migraines have not decreased in frequency despite topiramate use we will consider him neurology consult - Patient to follow-up via telephone visit in 4 weeks. -We will get recent labs to assess cardiovascular risk - CMP14+EGFR - Lipid Panel With LDL/HDL Ratio - HgB A1c

## 2021-02-28 ENCOUNTER — Other Ambulatory Visit: Payer: Self-pay | Admitting: Family Medicine

## 2021-02-28 DIAGNOSIS — M797 Fibromyalgia: Secondary | ICD-10-CM

## 2021-02-28 DIAGNOSIS — F32A Depression, unspecified: Secondary | ICD-10-CM

## 2021-03-01 ENCOUNTER — Encounter: Payer: Self-pay | Admitting: Nurse Practitioner

## 2021-03-01 DIAGNOSIS — F32A Depression, unspecified: Secondary | ICD-10-CM

## 2021-03-01 DIAGNOSIS — M797 Fibromyalgia: Secondary | ICD-10-CM

## 2021-03-01 MED ORDER — DULOXETINE HCL 30 MG PO CPEP: ORAL_CAPSULE | ORAL | 0 refills | Status: DC

## 2021-03-01 NOTE — Telephone Encounter (Signed)
Patient notified

## 2021-03-12 ENCOUNTER — Ambulatory Visit (INDEPENDENT_AMBULATORY_CARE_PROVIDER_SITE_OTHER): Payer: BC Managed Care – PPO | Admitting: Nurse Practitioner

## 2021-03-12 ENCOUNTER — Telehealth: Payer: BC Managed Care – PPO | Admitting: Nurse Practitioner

## 2021-03-12 ENCOUNTER — Other Ambulatory Visit: Payer: Self-pay

## 2021-03-12 ENCOUNTER — Telehealth: Payer: Self-pay | Admitting: *Deleted

## 2021-03-12 ENCOUNTER — Encounter: Payer: Self-pay | Admitting: Nurse Practitioner

## 2021-03-12 DIAGNOSIS — G43009 Migraine without aura, not intractable, without status migrainosus: Secondary | ICD-10-CM | POA: Diagnosis not present

## 2021-03-12 MED ORDER — TOPIRAMATE 25 MG PO TABS: 25.0000 mg | ORAL_TABLET | Freq: Two times a day (BID) | ORAL | 2 refills | Status: DC

## 2021-03-12 NOTE — Telephone Encounter (Signed)
Ms. yasenia, reedy are scheduled for a virtual visit with your provider today.    Just as we do with appointments in the office, we must obtain your consent to participate.  Your consent will be active for this visit and any virtual visit you may have with one of our providers in the next 365 days.    If you have a MyChart account, I can also send a copy of this consent to you electronically.  All virtual visits are billed to your insurance company just like a traditional visit in the office.  As this is a virtual visit, video technology does not allow for your provider to perform a traditional examination.  This may limit your provider's ability to fully assess your condition.  If your provider identifies any concerns that need to be evaluated in person or the need to arrange testing such as labs, EKG, etc, we will make arrangements to do so.    Although advances in technology are sophisticated, we cannot ensure that it will always work on either your end or our end.  If the connection with a video visit is poor, we may have to switch to a telephone visit.  With either a video or telephone visit, we are not always able to ensure that we have a secure connection.   I need to obtain your verbal consent now.   Are you willing to proceed with your visit today?   Automatic Data has provided verbal consent on 03/12/2021 for a virtual visit (video or telephone).   Rocco Serene, LPN 1/60/7371  0:62 AM

## 2021-03-12 NOTE — Progress Notes (Signed)
° °  Subjective:    Patient ID: [name], female    DOB: October 28, 1970, 51 y.o.   MRN: [id number]    Virtual Visit via Telephone Note  I connected with [name] on 03/12/21 at 10:00 AM EST by telephone and verified that I am speaking with the correct person using two identifiers.  Location: Patient: home Provider: office   I discussed the limitations, risks, security and privacy concerns of performing an evaluation and management service by telephone and the availability of in person appointments. I also discussed with the patient that there may be a patient responsible charge related to this service. The patient expressed understanding and agreed to proceed.   History of Present Illness:  Patient following up on migraines.  Patient was seen a month ago for daily migraines and was started on topiramate 25 mg.  Patient states that she is tolerating Topamax very well.  Patient states that she was having migraines every day however now being on Topamax she has not experienced any headaches in 2 weeks.  Patient denies that she has had to increase her dose and states that topiramate 25 mg works well.  Patient states that she has lost some weight while being on Topamax but welcomes the weight loss.  Patient has no complaints.    Observations/Objective: Patient very happy to have less headaches.  Patient is able to answer questions and responds appropriately without difficulty.  No signs or symptoms of distress noted on telephone encounter.  Assessment and Plan: 1. Migraine without aura and without status migrainosus, not intractable -Patient may continue with topiramate 25 mg twice a day. -Patient tolerating medication well we will continue with medication therapy. - topiramate (TOPAMAX) 25 MG tablet; Take 1 tablet (25 mg total) by mouth 2 (two) times daily.  Dispense: 120 tablet; Refill: 2 -Return to clinic if headaches increase or worsen. -Go to routine wellness visits as  scheduled.   Follow Up Instructions:    I discussed the assessment and treatment plan with the patient. The patient was provided an opportunity to ask questions and all were answered. The patient agreed with the plan and demonstrated an understanding of the instructions.   The patient was advised to call back or seek an in-person evaluation if the symptoms worsen or if the condition fails to improve as anticipated.  I provided  minutes of non-face-to-face time during this encounter.    Note:  This document was prepared using Dragon voice recognition software and may include unintentional dictation errors.

## 2021-03-25 ENCOUNTER — Other Ambulatory Visit (HOSPITAL_COMMUNITY)
Admission: RE | Admit: 2021-03-25 | Discharge: 2021-03-25 | Disposition: A | Discharge status: Home / Self Care | Payer: BC Managed Care – PPO | Source: Ambulatory Visit | Attending: Obstetrics & Gynecology | Admitting: Obstetrics & Gynecology

## 2021-03-25 ENCOUNTER — Other Ambulatory Visit: Payer: Self-pay

## 2021-03-25 ENCOUNTER — Ambulatory Visit (INDEPENDENT_AMBULATORY_CARE_PROVIDER_SITE_OTHER): Payer: BC Managed Care – PPO | Admitting: Obstetrics & Gynecology

## 2021-03-25 ENCOUNTER — Encounter: Payer: Self-pay | Admitting: Obstetrics & Gynecology

## 2021-03-25 VITALS — BP 110/77 | HR 84 | Ht 62.0 in | Wt 152.5 lb

## 2021-03-25 DIAGNOSIS — Z124 Encounter for screening for malignant neoplasm of cervix: Secondary | ICD-10-CM | POA: Diagnosis not present

## 2021-03-25 DIAGNOSIS — N938 Other specified abnormal uterine and vaginal bleeding: Secondary | ICD-10-CM

## 2021-03-25 MED ORDER — MEGESTROL ACETATE 40 MG PO TABS: ORAL_TABLET | ORAL | 3 refills | Status: DC

## 2021-03-25 NOTE — Progress Notes (Signed)
? ? ? ? ?Chief Complaint  ?Patient presents with  ? period this month was for 3.5 weeks  ? ? ? ? ?51 y.o. G1P1001 Patient's last menstrual period was 02/18/2021 (approximate). The current method of family planning is vasectomy. ? ?Outpatient Encounter Medications as of 03/25/2021  ?Medication Sig  ? diclofenac (VOLTAREN) 75 MG EC tablet TAKE (1) TABLET BY MOUTH TWICE DAILY. (Patient taking differently: as needed.)  ? DULoxetine (CYMBALTA) 30 MG capsule TAKE (1) CAPSULE BY MOUTH ONCE DAILY.  ? insulin aspart (NOVOLOG) 100 UNIT/ML injection by Pump Prime route continuous.  ? Insulin Human (INSULIN PUMP) SOLN Inject into the skin continuous.   ? levothyroxine (SYNTHROID, LEVOTHROID) 25 MCG tablet Take 25 mcg by mouth daily before breakfast.  ? megestrol (MEGACE) 40 MG tablet 3 tablets a day for 5 days, 2 tablets a day for 5 days then 1 tablet daily  ? OVER THE COUNTER MEDICATION Vitamin b  ? rosuvastatin (CRESTOR) 5 MG tablet Take 5 mg by mouth. Three times a week  ? SUMAtriptan (IMITREX) 50 MG tablet Take 1 tablet (50 mg total) by mouth every 2 (two) hours as needed for migraine. May repeat in 2 hours if headache persists or recurs.  ? topiramate (TOPAMAX) 25 MG tablet Take 1 tablet (25 mg total) by mouth 2 (two) times daily.  ? Vitamin D, Ergocalciferol, (DRISDOL) 1.25 MG (50000 UNIT) CAPS capsule Take 50,000 Units by mouth once a week.  ? [DISCONTINUED] ipratropium (ATROVENT) 0.03 % nasal spray Place 2 sprays into both nostrils every 12 (twelve) hours. (Patient not taking: Reported on 03/12/2021)  ? [DISCONTINUED] simvastatin (ZOCOR) 10 MG tablet Take 10 mg by mouth daily. Dr. Talmage Nap  ? ?No facility-administered encounter medications on file as of 03/25/2021.  ? ? ?Subjective ?Pt has had completely regular periods up until this past month ?Bleeding heavier than she normally does ?No cycle management ?Not really crampy ?Wears both for bleeding protection with this bleeding ? ?Past Medical History:  ?Diagnosis Date  ?  Diabetes (HCC)   ? Fibromyalgia   ? High cholesterol   ? Thyroid disease   ? ? ?Past Surgical History:  ?Procedure Laterality Date  ? caesarean    ? ? ?OB History   ? ? Gravida  ?1  ? Para  ?1  ? Term  ?1  ? Preterm  ?   ? AB  ?   ? Living  ?1  ?  ? ? SAB  ?   ? IAB  ?   ? Ectopic  ?   ? Multiple  ?   ? Live Births  ?   ?   ?  ?  ? ? ?No Known Allergies ? ?Social History  ? ?Socioeconomic History  ? Marital status: Married  ?  Spouse name: Not on file  ? Number of children: Not on file  ? Years of education: Not on file  ? Highest education level: Not on file  ?Occupational History  ? Not on file  ?Tobacco Use  ? Smoking status: Never  ? Smokeless tobacco: Never  ?Vaping Use  ? Vaping Use: Never used  ?Substance and Sexual Activity  ? Alcohol use: Yes  ?  Comment: twice a week  ? Drug use: No  ? Sexual activity: Yes  ?  Birth control/protection: Surgical  ?  Comment: vasectomy  ?Other Topics Concern  ? Not on file  ?Social History Narrative  ? Not on file  ? ?Social Determinants of Health  ? ?  Financial Resource Strain: Low Risk   ? Difficulty of Paying Living Expenses: Not hard at all  ?Food Insecurity: No Food Insecurity  ? Worried About Programme researcher, broadcasting/film/video in the Last Year: Never true  ? Ran Out of Food in the Last Year: Never true  ?Transportation Needs: No Transportation Needs  ? Lack of Transportation (Medical): No  ? Lack of Transportation (Non-Medical): No  ?Physical Activity: Insufficiently Active  ? Days of Exercise per Week: 2 days  ? Minutes of Exercise per Session: 20 min  ?Stress: No Stress Concern Present  ? Feeling of Stress : Only a little  ?Social Connections: Moderately Integrated  ? Frequency of Communication with Friends and Family: Three times a week  ? Frequency of Social Gatherings with Friends and Family: Three times a week  ? Attends Religious Services: More than 4 times per year  ? Active Member of Clubs or Organizations: No  ? Attends Banker Meetings: Never  ? Marital Status:  Married  ? ? ?Family History  ?Problem Relation Age of Onset  ? Cancer Paternal Grandfather   ? Cancer Paternal Grandmother   ? Cancer Maternal Grandmother   ? Cancer Maternal Grandfather   ? Heart attack Father   ? Thyroid disease Sister   ? Fibromyalgia Sister   ? Diabetes Other   ? ? ?Medications:       ?Current Outpatient Medications:  ?  diclofenac (VOLTAREN) 75 MG EC tablet, TAKE (1) TABLET BY MOUTH TWICE DAILY. (Patient taking differently: as needed.), Disp: 60 tablet, Rfl: 4 ?  DULoxetine (CYMBALTA) 30 MG capsule, TAKE (1) CAPSULE BY MOUTH ONCE DAILY., Disp: 90 capsule, Rfl: 0 ?  insulin aspart (NOVOLOG) 100 UNIT/ML injection, by Pump Prime route continuous., Disp: , Rfl:  ?  Insulin Human (INSULIN PUMP) SOLN, Inject into the skin continuous. , Disp: , Rfl:  ?  levothyroxine (SYNTHROID, LEVOTHROID) 25 MCG tablet, Take 25 mcg by mouth daily before breakfast., Disp: , Rfl:  ?  megestrol (MEGACE) 40 MG tablet, 3 tablets a day for 5 days, 2 tablets a day for 5 days then 1 tablet daily, Disp: 45 tablet, Rfl: 3 ?  OVER THE COUNTER MEDICATION, Vitamin b, Disp: , Rfl:  ?  rosuvastatin (CRESTOR) 5 MG tablet, Take 5 mg by mouth. Three times a week, Disp: , Rfl:  ?  SUMAtriptan (IMITREX) 50 MG tablet, Take 1 tablet (50 mg total) by mouth every 2 (two) hours as needed for migraine. May repeat in 2 hours if headache persists or recurs., Disp: 10 tablet, Rfl: 0 ?  topiramate (TOPAMAX) 25 MG tablet, Take 1 tablet (25 mg total) by mouth 2 (two) times daily., Disp: 120 tablet, Rfl: 2 ?  Vitamin D, Ergocalciferol, (DRISDOL) 1.25 MG (50000 UNIT) CAPS capsule, Take 50,000 Units by mouth once a week., Disp: , Rfl:  ? ?Objective ?Blood pressure 110/77, pulse 84, height 5\' 2"  (1.575 m), weight 152 lb 8 oz (69.2 kg), last menstrual period 02/18/2021. ? ?General [facility] female NAD ?Vulva:  normal appearing vulva with no masses, tenderness or lesions ?Vagina:  normal mucosa, no discharge ?Cervix:  Normal no lesions ?Uterus:  normal  size, contour, position, consistency, mobility, non-tender ?Adnexa: ovaries:present,  normal adnexa in size, nontender and no masses ? ? ?Pertinent ROS ?No burning with urination, frequency or urgency ?No nausea, vomiting or diarrhea ?Nor fever chills or other constitutional symptoms ? ? ?Labs or studies ? ? ? ?Impression ?Diagnoses this Encounter:: ?  ICD-10-CM   ?  1. Perimenopausal DUB  N93.8   ? Megestrol to synchronize endometrium then stop and see how it goes form there since this is first episode  ?  ?2. Routine cervical smear  Z12.4 Cytology - PAP( [place])  ?  ? ? ?Established relevant diagnosis(es): ? ? ?Plan/Recommendations: ?Meds ordered this encounter  ?Medications  ? megestrol (MEGACE) 40 MG tablet  ?  Sig: 3 tablets a day for 5 days, 2 tablets a day for 5 days then 1 tablet daily  ?  Dispense:  45 tablet  ?  Refill:  3  ? ? ?Labs or Scans Ordered: ?No orders of the defined types were placed in this encounter. ? ? ?Management:: ?See A/P ? ?Follow up ?Return if symptoms worsen or fail to improve. ? ? ? ? ? ? All questions were answered. ?  ?

## 2021-03-28 LAB — CYTOLOGY - PAP
Comment: NEGATIVE
Diagnosis: NEGATIVE
High risk HPV: NEGATIVE

## 2021-04-29 DIAGNOSIS — E7800 Pure hypercholesterolemia, unspecified: Secondary | ICD-10-CM | POA: Diagnosis not present

## 2021-04-29 DIAGNOSIS — E559 Vitamin D deficiency, unspecified: Secondary | ICD-10-CM | POA: Diagnosis not present

## 2021-04-29 DIAGNOSIS — E039 Hypothyroidism, unspecified: Secondary | ICD-10-CM | POA: Diagnosis not present

## 2021-04-29 DIAGNOSIS — E109 Type 1 diabetes mellitus without complications: Secondary | ICD-10-CM | POA: Diagnosis not present

## 2021-05-07 DIAGNOSIS — E11319 Type 2 diabetes mellitus with unspecified diabetic retinopathy without macular edema: Secondary | ICD-10-CM | POA: Diagnosis not present

## 2021-05-07 DIAGNOSIS — R768 Other specified abnormal immunological findings in serum: Secondary | ICD-10-CM | POA: Diagnosis not present

## 2021-05-07 DIAGNOSIS — E039 Hypothyroidism, unspecified: Secondary | ICD-10-CM | POA: Diagnosis not present

## 2021-05-07 DIAGNOSIS — Z9641 Presence of insulin pump (external) (internal): Secondary | ICD-10-CM | POA: Diagnosis not present

## 2021-05-27 ENCOUNTER — Other Ambulatory Visit: Payer: Self-pay | Admitting: Family Medicine

## 2021-05-27 DIAGNOSIS — M797 Fibromyalgia: Secondary | ICD-10-CM

## 2021-05-28 ENCOUNTER — Ambulatory Visit (INDEPENDENT_AMBULATORY_CARE_PROVIDER_SITE_OTHER): Payer: BC Managed Care – PPO | Admitting: Nurse Practitioner

## 2021-05-28 ENCOUNTER — Encounter: Payer: Self-pay | Admitting: Nurse Practitioner

## 2021-05-28 VITALS — BP 137/82 | HR 85 | Temp 98.1°F | Ht 62.0 in | Wt 150.0 lb

## 2021-05-28 DIAGNOSIS — M25531 Pain in right wrist: Secondary | ICD-10-CM

## 2021-05-28 MED ORDER — DICLOFENAC SODIUM 75 MG PO TBEC: 75.0000 mg | DELAYED_RELEASE_TABLET | Freq: Every day | ORAL | 2 refills | Status: DC

## 2021-05-28 NOTE — Progress Notes (Signed)
? ?  Subjective:  ? ? Patient ID: [name], female    DOB: 1970/03/10, 51 y.o.   MRN: [id number] ? ?HPI ?51 year old female patient with history of type 1 diabetes presents to clinic today with right wrist pain worse since Friday.patient states that she has had right wrist pain for about a month and was taking Voltaren 75 mg.  Patient states that she ran out of Voltaren 75 mg pills around Thursday.  Consequently patient has been experiencing more increased pain to her right wrist.  Patient states that right wrist will hurt when doing any type of movement such as lifting slightly heavy or moving thumb.  Patient states that she feels like a knot is on the radial side of her wrist.  Patient denies any injury. ? ? ?Review of Systems  ?Musculoskeletal:   ?     Right wrist pain  ? ?   ?Objective:  ? Physical Exam ?Vitals reviewed.  ?Constitutional:   ?   General: She is not in acute distress. ?   Appearance: Normal appearance. She is normal weight. She is not ill-appearing, toxic-appearing or diaphoretic.  ?HENT:  ?   Head: Normocephalic and atraumatic.  ?Cardiovascular:  ?   Rate and Rhythm: Normal rate and regular rhythm.  ?   Pulses: Normal pulses.  ?   Heart sounds: No murmur heard. ?Pulmonary:  ?   Effort: Pulmonary effort is normal. No respiratory distress.  ?   Breath sounds: Normal breath sounds. No wheezing.  ?Musculoskeletal:  ?   Right wrist: Tenderness present. No swelling, deformity, effusion, lacerations, bony tenderness, snuff box tenderness or crepitus. Normal range of motion. Normal pulse.  ?   Left wrist: Normal.  ?   Comments: Grossly intact ? ?Right radial area of wrist tender to palpation.  ? ?Pain with hand flexion, extension, abduction, and adduction.   ?Skin: ?   General: Skin is warm.  ?   Capillary Refill: Capillary refill takes less than 2 seconds.  ?Neurological:  ?   Mental Status: She is alert.  ?   Comments: Grossly intact  ?Psychiatric:     ?   Mood and Affect: Mood normal.     ?    Behavior: Behavior normal.  ? ? ?   ?Assessment & Plan:  ? ?1. Right wrist pain ?- Suspect De Quervian tendinopathy ?- diclofenac (VOLTAREN) 75 MG EC tablet; Take 1 tablet (75 mg total) by mouth daily.  Dispense: 60 tablet; Refill: 2' ?- Continue to take Voltaren as prescribed. ?- Will referral to Ortho for possibility of steroid injections to the area.  ?- Ambulatory referral to Orthopedic Surgery ?- Low suspicion for wrist fracture, however, will r/o fraction with wrist Xray ?- DG Wrist Complete Right ?- RTC if symptoms not better or worsening.  ? ?  ?Note:  This document was prepared using Dragon voice recognition software and may include unintentional dictation errors. ?Note - This record has been created using [employer].  ?Chart creation errors have been sought, but may not always  ?have been located. Such creation errors do not reflect on  ?the standard of medical care. ? ?

## 2021-05-29 ENCOUNTER — Encounter (HOSPITAL_COMMUNITY): Payer: Self-pay

## 2021-05-29 ENCOUNTER — Ambulatory Visit (HOSPITAL_COMMUNITY): Admission: RE | Admit: 2021-05-29 | Payer: BC Managed Care – PPO | Source: Ambulatory Visit

## 2021-05-31 ENCOUNTER — Ambulatory Visit (HOSPITAL_COMMUNITY)
Admission: RE | Admit: 2021-05-31 | Discharge: 2021-05-31 | Disposition: A | Discharge status: Home / Self Care | Payer: BC Managed Care – PPO | Source: Ambulatory Visit | Attending: Nurse Practitioner | Admitting: Nurse Practitioner

## 2021-05-31 DIAGNOSIS — M25531 Pain in right wrist: Secondary | ICD-10-CM | POA: Insufficient documentation

## 2021-06-10 ENCOUNTER — Encounter: Payer: Self-pay | Admitting: Orthopedic Surgery

## 2021-06-10 ENCOUNTER — Ambulatory Visit (INDEPENDENT_AMBULATORY_CARE_PROVIDER_SITE_OTHER): Payer: BC Managed Care – PPO | Admitting: Orthopedic Surgery

## 2021-06-10 VITALS — BP 134/79 | HR 77 | Ht 62.0 in | Wt 154.0 lb

## 2021-06-10 DIAGNOSIS — M654 Radial styloid tenosynovitis [de Quervain]: Secondary | ICD-10-CM | POA: Diagnosis not present

## 2021-06-10 NOTE — Progress Notes (Signed)
Chief Complaint  Patient presents with   Wrist Pain    R/I fell over one of my dogs in the livingroom and tried to catch myself with my right hand. This happened about 1.5 months ago. Had xrays done at [facility] 05/31/21 my PCP sent me.   51 year old female status post fall right wrist x-ray May 12 no fracture complains of right wrist pain  The pain is over the first extensor compartment is associated with ulnar deviation  Review of systems is negative  She does have a history of cataracts and thyroid disease  BP 134/79   Pulse 77   Ht 5\' 2"  (1.575 m)   Wt 154 lb (69.9 kg)   BMI 28.17 kg/m   Focused exam right wrist tenderness over the first extensor compartment positive Finkelstein's test pain with ulnar deviation normal wrist flexion extension  X-ray was from an outside facility  My interpretation no fracture or dislocation  Encounter Diagnosis  Name Primary?   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

## 2021-06-12 ENCOUNTER — Encounter: Payer: Self-pay | Admitting: Orthopedic Surgery

## 2021-06-14 DIAGNOSIS — H43822 Vitreomacular adhesion, left eye: Secondary | ICD-10-CM | POA: Diagnosis not present

## 2021-06-14 DIAGNOSIS — H31093 Other chorioretinal scars, bilateral: Secondary | ICD-10-CM | POA: Diagnosis not present

## 2021-06-14 DIAGNOSIS — H2512 Age-related nuclear cataract, left eye: Secondary | ICD-10-CM | POA: Diagnosis not present

## 2021-06-14 DIAGNOSIS — E103593 Type 1 diabetes mellitus with proliferative diabetic retinopathy without macular edema, bilateral: Secondary | ICD-10-CM | POA: Diagnosis not present

## 2021-07-15 ENCOUNTER — Encounter: Payer: Self-pay | Admitting: Nurse Practitioner

## 2021-07-17 ENCOUNTER — Encounter: Payer: Self-pay | Admitting: Nurse Practitioner

## 2021-07-17 ENCOUNTER — Ambulatory Visit (INDEPENDENT_AMBULATORY_CARE_PROVIDER_SITE_OTHER): Payer: BC Managed Care – PPO | Admitting: Nurse Practitioner

## 2021-07-17 VITALS — BP 112/70 | HR 87 | Temp 97.2°F | Ht 62.0 in | Wt 155.8 lb

## 2021-07-17 DIAGNOSIS — R413 Other amnesia: Secondary | ICD-10-CM

## 2021-07-17 NOTE — Progress Notes (Signed)
   Subjective:    Patient ID: [name], female    DOB: 09-22-1970, 51 y.o.   MRN: [id number]  HPI  Patient here to discuss memory loss.  Patient states that she has had trouble with her short-term memory over the past 1 to 2 years.  However over the past week patient states that she has noticed her memory become more of an issue.  Patient states that both husband, daughter, coworkers, and other family members have recognized that she has been more forgetful.  Patient states that she became really concerned after calling her sister 3 different times on 3 different occasions asking the same question.  Patient states that she cannot remember calling her sister and asking her the same question over again.  Patient also states that she noticed that her memory is slipping when she could not remember that her husband told her about his upcoming trip.  Patient states that her coworkers have noticed that her short-term memory has been altered but assumed that it was due to her sugar being low.  Patient states that she has been under more stress over the past month regarding her daughter's upcoming surgery.   Review of Systems  Neurological:        Altered memory  All other systems reviewed and are negative.      Objective:   Physical Exam Vitals reviewed.  Constitutional:      General: She is not in acute distress.    Appearance: Normal appearance. She is normal weight. She is not ill-appearing, toxic-appearing or diaphoretic.  Neurological:     Mental Status: She is alert.  Psychiatric:        Mood and Affect: Mood normal.        Behavior: Behavior normal.        Thought Content: Thought content normal.        Cognition and Memory: Cognition and memory normal.        Judgment: Judgment normal.     Comments: MoCA test 29 out of 30 today.         Assessment & Plan:   1. Memory change -MoCA 29 out of 30 today. -However patient still concerned about memory changes despite  scoring 29 out of 30 on t MoCA -We will refer patient to neurology for further examination.. - Ambulatory referral to Neurology -Patient to return to clinic as needed or if symptoms worsen.    Note:  This document was prepared using Dragon voice recognition software and may include unintentional dictation errors. Note - This record has been created using [employer].  Chart creation errors have been sought, but may not always  have been located. Such creation errors do not reflect on  the standard of medical care.

## 2021-07-29 ENCOUNTER — Ambulatory Visit: Payer: BC Managed Care – PPO | Admitting: Orthopedic Surgery

## 2021-07-29 ENCOUNTER — Other Ambulatory Visit: Payer: Self-pay | Admitting: *Deleted

## 2021-07-29 DIAGNOSIS — Z1231 Encounter for screening mammogram for malignant neoplasm of breast: Secondary | ICD-10-CM

## 2021-08-05 ENCOUNTER — Inpatient Hospital Stay (HOSPITAL_COMMUNITY): Admission: RE | Admit: 2021-08-05 | Payer: BC Managed Care – PPO | Source: Ambulatory Visit

## 2021-08-12 ENCOUNTER — Ambulatory Visit (HOSPITAL_COMMUNITY): Payer: BC Managed Care – PPO

## 2021-08-15 ENCOUNTER — Ambulatory Visit (INDEPENDENT_AMBULATORY_CARE_PROVIDER_SITE_OTHER): Payer: BC Managed Care – PPO | Admitting: Orthopedic Surgery

## 2021-08-15 ENCOUNTER — Encounter: Payer: Self-pay | Admitting: Orthopedic Surgery

## 2021-08-15 DIAGNOSIS — E785 Hyperlipidemia, unspecified: Secondary | ICD-10-CM | POA: Insufficient documentation

## 2021-08-15 DIAGNOSIS — M654 Radial styloid tenosynovitis [de Quervain]: Secondary | ICD-10-CM | POA: Diagnosis not present

## 2021-08-15 DIAGNOSIS — E11319 Type 2 diabetes mellitus with unspecified diabetic retinopathy without macular edema: Secondary | ICD-10-CM | POA: Insufficient documentation

## 2021-08-15 DIAGNOSIS — E039 Hypothyroidism, unspecified: Secondary | ICD-10-CM | POA: Insufficient documentation

## 2021-08-15 DIAGNOSIS — E7800 Pure hypercholesterolemia, unspecified: Secondary | ICD-10-CM | POA: Insufficient documentation

## 2021-08-15 DIAGNOSIS — M359 Systemic involvement of connective tissue, unspecified: Secondary | ICD-10-CM | POA: Insufficient documentation

## 2021-08-15 DIAGNOSIS — R768 Other specified abnormal immunological findings in serum: Secondary | ICD-10-CM | POA: Insufficient documentation

## 2021-08-15 DIAGNOSIS — Z9641 Presence of insulin pump (external) (internal): Secondary | ICD-10-CM | POA: Insufficient documentation

## 2021-08-15 DIAGNOSIS — E559 Vitamin D deficiency, unspecified: Secondary | ICD-10-CM | POA: Insufficient documentation

## 2021-08-15 DIAGNOSIS — E1065 Type 1 diabetes mellitus with hyperglycemia: Secondary | ICD-10-CM | POA: Insufficient documentation

## 2021-08-15 NOTE — Patient Instructions (Signed)
Use ice and wean yourself out of the splint if you have increased pain please call make a new appointment

## 2021-08-15 NOTE — Progress Notes (Signed)
FOLLOW UP   Encounter Diagnosis  Name Primary?   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  Exam shows no tenderness and only mild pain with ulnar deviation  Recommend that she use ice and wean herself out of the splint  If symptoms return make a new appointment

## 2021-08-16 ENCOUNTER — Ambulatory Visit (HOSPITAL_COMMUNITY)
Admission: RE | Admit: 2021-08-16 | Discharge: 2021-08-16 | Disposition: A | Discharge status: Home / Self Care | Payer: BC Managed Care – PPO | Source: Ambulatory Visit | Attending: Family Medicine | Admitting: Family Medicine

## 2021-08-16 DIAGNOSIS — Z1231 Encounter for screening mammogram for malignant neoplasm of breast: Secondary | ICD-10-CM | POA: Insufficient documentation

## 2021-08-27 ENCOUNTER — Encounter: Payer: Self-pay | Admitting: Obstetrics & Gynecology

## 2021-08-27 ENCOUNTER — Ambulatory Visit (INDEPENDENT_AMBULATORY_CARE_PROVIDER_SITE_OTHER): Payer: BC Managed Care – PPO | Admitting: Obstetrics & Gynecology

## 2021-08-27 ENCOUNTER — Other Ambulatory Visit (HOSPITAL_COMMUNITY)
Admission: RE | Admit: 2021-08-27 | Discharge: 2021-08-27 | Disposition: A | Discharge status: Home / Self Care | Payer: BC Managed Care – PPO | Source: Ambulatory Visit | Attending: Obstetrics & Gynecology | Admitting: Obstetrics & Gynecology

## 2021-08-27 VITALS — BP 124/76 | HR 89 | Ht 62.0 in | Wt 152.8 lb

## 2021-08-27 DIAGNOSIS — N939 Abnormal uterine and vaginal bleeding, unspecified: Secondary | ICD-10-CM

## 2021-08-27 DIAGNOSIS — E109 Type 1 diabetes mellitus without complications: Secondary | ICD-10-CM | POA: Diagnosis not present

## 2021-08-27 MED ORDER — SLYND 4 MG PO TABS: 4.0000 mg | ORAL_TABLET | Freq: Every day | ORAL | 4 refills | Status: AC

## 2021-08-27 NOTE — Progress Notes (Signed)
GYN VISIT Patient name: [name] MRN [id number]  Date of birth: May 24, 1970 Chief Complaint:   had bleeding X 3 weeks (Bleeding stopped last week)  History of Present Illness:   [name] is a 51 y.o. G50P1001 female being seen today for the following concerns:  AUB: Last seen by Dr. Despina Hidden in March due to HMB x 3wks- note reviewed.  Finished Megace taper in March- had a regular period that lasted for a week with moderate bleeding.  Since March, no period for several months.  Then 3 weeks ago in July started period and bleeding lasted for 3 weeks.  Bleeding was not as heavy, using 2 pads per day.  Denis significant dysmenorrhea.On occasion would have very light spotting.  No other acute concerns.  Prior to episode in March, menses were still regular each month though they seemed to be getting heavier.     No LMP recorded.     03/25/2021    1:42 PM 07/30/2020    9:51 AM 12/20/2019   11:12 AM  Depression screen PHQ 2/9  Decreased Interest 0 2 2  Down, Depressed, Hopeless 0 1 2  PHQ - 2 Score 0 3 4  Altered sleeping 0 1 2  Tired, decreased energy 1 1 3   Change in appetite 1 1 0  Feeling bad or failure about yourself  0 0 2  Trouble concentrating 0 1 3  Moving slowly or fidgety/restless 0 1 1  Suicidal thoughts 0 0 0  PHQ-9 Score 2 8 15   Difficult doing work/chores  Somewhat difficult Somewhat difficult     Review of Systems:   Pertinent items are noted in HPI Denies fever/chills, dizziness, headaches, visual disturbances, fatigue, shortness of breath, chest pain, abdominal pain, vomiting, no problems with bowel movements, urination, or intercourse unless otherwise stated above.  Pertinent History Reviewed:  Reviewed past medical,surgical, social, obstetrical and family history.  Reviewed problem list, medications and allergies. Physical Assessment:   Vitals:   08/27/21 0914  BP: 124/76  Pulse: 89  Weight: 152 lb 12.8 oz (69.3 kg)  Height: 5\' 2"  (1.575 m)   Body mass index is 27.95 kg/m.       Physical Examination:   General appearance: alert, well appearing, and in no distress  Psych: mood appropriate, normal affect  Skin: warm & dry   Cardiovascular: normal heart rate noted  Respiratory: normal respiratory effort, no distress  Abdomen: soft, non-tender, no rebound, no guarding  Pelvic: VULVA: normal appearing vulva with no masses, tenderness or lesions, VAGINA: normal appearing vagina with normal color and discharge, no lesions, CERVIX: normal appearing cervix without discharge or lesions, normal sized uterus-non-tender  Extremities: no edema    Chaperone: Dr.  Endometrial Biopsy Procedure Note  Pre-operative Diagnosis: AUB  Post-operative Diagnosis: same  Procedure Details  The risks (including infection, bleeding, pain, and uterine perforation) and benefits of the procedure were explained to the patient and Written informed consent was obtained.  Antibiotic prophylaxis against endocarditis was not indicated.   The patient was placed in the dorsal lithotomy position.  Bimanual exam showed the uterus to be in the neutral position.  A speculum inserted in the vagina, and the cervix prepped with betadine.     A single tooth tenaculum was applied to the anterior lip of the cervix for stabilization.  A sterile uterine sound was used to sound the uterus to a depth of 8cm.  A Pipelle endometrial aspirator was used to sample the endometrium.  Sample was sent for pathologic examination.  Condition: Stable  Complications: None    Assessment & Plan:  1) Abnormal uterine bleeding -discussed potential causes- likely AUB-O recommended EMB to r/o underlying etiology.  Inform consent obtained, see above procedure -Next step pending results of pathology.   -no evidence of anemia, no recent CBC -The patient was advised to call for any fever or for prolonged or severe pain or bleeding. She was advised to use OTC analgesics as  needed for mild to moderate pain. She was advised to avoid vaginal intercourse for 48 hours or until the bleeding has completely stopped. -discussed management options of prolonged progesterone use.  Plan to start POPs daily -F/U in 3 mos []  may consider and CBC pending future bleeding pattern  Meds ordered this encounter  Medications   Drospirenone (SLYND) 4 MG TABS    Sig: Take 4 mg by mouth daily.    Dispense:  90 tablet    Refill:  4    Return in about 3 months (around 11/27/2021) for Medication follow up- Farhiya Rosten or Eure.   13/08/2021, DO Attending Obstetrician & Gynecologist, [facility] for [facility]

## 2021-08-28 ENCOUNTER — Ambulatory Visit (INDEPENDENT_AMBULATORY_CARE_PROVIDER_SITE_OTHER): Payer: BC Managed Care – PPO | Admitting: Psychiatry

## 2021-08-28 ENCOUNTER — Encounter: Payer: Self-pay | Admitting: Psychiatry

## 2021-08-28 VITALS — BP 115/78 | HR 81 | Ht 62.0 in | Wt 153.2 lb

## 2021-08-28 DIAGNOSIS — R413 Other amnesia: Secondary | ICD-10-CM | POA: Diagnosis not present

## 2021-08-28 DIAGNOSIS — G3184 Mild cognitive impairment, so stated: Secondary | ICD-10-CM

## 2021-08-28 LAB — SURGICAL PATHOLOGY

## 2021-08-28 MED ORDER — LORAZEPAM 0.5 MG PO TABS: ORAL_TABLET | ORAL | 0 refills | Status: DC

## 2021-08-28 NOTE — Progress Notes (Signed)
[facility]  PATIENT: [name] DOB: October 31, 1970  REFERRING CLINICIAN: [employer], Alvino Chapel, NP HISTORY FROM: self, husband Onalee Hua REASON FOR VISIT: memory loss   HISTORICAL  CHIEF COMPLAINT:  Chief Complaint  Patient presents with   Memory changes    Rm 8 New Pt  husbandOnalee Hua  [facility]    HISTORY OF PRESENT ILLNESS:  The patient presents for evaluation of memory loss which has been present over the past year. She has noticed mild memory issues for the past 2 years, but it got significantly worse over the past 3 months. Notes she did have a lot of stress at the time because her daughter was having back surgery. In June she called her sister 3 times to ask the same question, which she did not remember doing. Forget her husband told her about a trip he was going on. Coworkers also noted increased forgetfulness. Feels memory has improved slightly in the past month now that her stress level has decreased. She is also working part time now. She does not feel completely back to baseline though, and will still forget things if she doesn't write them down.  Saw her PCP in June where MOCA was 29/30.  She was started on Topamax in late January of this year. Notes she did have mild memory issues before starting Topamax, but they did worsen this year.  TBI: hit head in 2001 and had a concussion Stroke:  no past history of stroke Seizures: had a diabetic seizure in 2001, none since then Sleep: Sleeps well at night. Sleeps 9-10 hours. Does not snore or have daytime fatigue.  Mood: Patient denies anxiety and depression. She did have increased stress recently due to daughter's back surgery  Functional status:  Patient lives with husband Cooking: no issues, does not do much cooking. Has not left the stove on Cleaning: no issues Shopping: no issues Driving: Has missed turns while driving but not gotten lost Bills: husband handles the finances Medications: no  issues Forgetting loved ones names?: no Word finding difficulty? yes  OTHER MEDICAL CONDITIONS: hypothyroidism, migraines, HLD, DM   REVIEW OF SYSTEMS: Full 14 system review of systems performed and negative with exception of: memory loss  ALLERGIES: No Known Allergies  HOME MEDICATIONS: Outpatient Medications Prior to Visit  Medication Sig Dispense Refill   diclofenac (VOLTAREN) 75 MG EC tablet Take 1 tablet (75 mg total) by mouth daily. 60 tablet 2   Drospirenone (SLYND) 4 MG TABS Take 4 mg by mouth daily. 90 tablet 4   DULoxetine (CYMBALTA) 30 MG capsule TAKE (1) CAPSULE BY MOUTH ONCE DAILY. 90 capsule 0   insulin aspart (NOVOLOG) 100 UNIT/ML injection by Pump Prime route continuous.     Insulin Human (INSULIN PUMP) SOLN Inject into the skin continuous.      insulin lispro (HUMALOG) 100 UNIT/ML injection SMARTSIG:0-150 Unit(s) SUB-Q Daily     levothyroxine (SYNTHROID) 75 MCG tablet Take 1 tablet by mouth daily.     OVER THE COUNTER MEDICATION Vitamin b     rosuvastatin (CRESTOR) 5 MG tablet Take 5 mg by mouth. Three times a week     topiramate (TOPAMAX) 25 MG tablet Take 1 tablet (25 mg total) by mouth 2 (two) times daily. 120 tablet 2   Vitamin D, Ergocalciferol, (DRISDOL) 1.25 MG (50000 UNIT) CAPS capsule Take 50,000 Units by mouth once a week.     No facility-administered medications prior to visit.    PAST MEDICAL HISTORY: Past Medical History:  Diagnosis Date   Diabetes (HCC)    Fibromyalgia    High cholesterol    Thyroid disease     PAST SURGICAL HISTORY: Past Surgical History:  Procedure Laterality Date   caesarean      FAMILY HISTORY: Family History  Problem Relation Age of Onset   Cancer Paternal Grandfather    Cancer Paternal Grandmother    Cancer Maternal Grandmother    Cancer Maternal Grandfather    Heart attack Father    Thyroid disease Sister    Fibromyalgia Sister    Diabetes Other     SOCIAL HISTORY: Social History   Socioeconomic  History   Marital status: Married    Spouse name: Dvid   Number of children: 1   Years of education: Not on file   Highest education level: Associate degree: occupational, Scientist, product/process development, or vocational program  Occupational History    Comment: part time, 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  Tobacco Use   Smoking status: Never   Smokeless tobacco: Never  Vaping Use   Vaping Use: Never used  Substance and Sexual Activity   Alcohol use: Yes    Comment: twice a week   Drug use: No   Sexual activity: Yes    Birth control/protection: Surgical    Comment: vasectomy  Other Topics Concern   Not on file  Social History Narrative   08/28/21 Lives with husband   Caffeine- coffee 2 c, Coke Zeros 4-5 cans a day   Social Determinants of Health   Financial Resource Strain: Low Risk  (03/25/2021)   Overall Financial Resource Strain (CARDIA)    Difficulty of Paying Living Expenses: Not hard at all  Food Insecurity: No Food Insecurity (03/25/2021)   Hunger Vital Sign    Worried About Running Out of Food in the Last Year: Never true    Ran Out of Food in the Last Year: Never true  Transportation Needs: No Transportation Needs (03/25/2021)   PRAPARE - Administrator, Civil Service (Medical): No    Lack of Transportation (Non-Medical): No  Physical Activity: Insufficiently Active (03/25/2021)   Exercise Vital Sign    Days of Exercise per Week: 2 days    Minutes of Exercise per Session: 20 min  Stress: No Stress Concern Present (03/25/2021)   [employer] of Occupational Health - Occupational Stress Questionnaire    Feeling of Stress : Only a little  Social Connections: Moderately Integrated (03/25/2021)   Social Connection and Isolation Panel [NHANES]    Frequency of Communication with Friends and Family: Three times a week    Frequency of Social Gatherings with Friends and Family: Three times a week    Attends Religious Services: More than 4 times per year    Active Member of Clubs or  Organizations: No    Attends Banker Meetings: Never    Marital Status: Married  Catering manager Violence: Not At Risk (03/25/2021)   Humiliation, Afraid, Rape, and Kick questionnaire    Fear of Current or Ex-Partner: No    Emotionally Abused: No    Physically Abused: No    Sexually Abused: No     PHYSICAL EXAM   GENERAL EXAM/CONSTITUTIONAL: Vitals:  Vitals:   08/28/21 0907  BP: 115/78  Pulse: 81  Weight: 153 lb 3.2 oz (69.5 kg)  Height: 5\' 2"  (1.575 m)   Body mass index is 28.02 kg/m. Wt Readings from Last 3 Encounters:  08/28/21 153 lb 3.2 oz (69.5 kg)  08/27/21 152 lb 12.8  oz (69.3 kg)  07/17/21 155 lb 12.8 oz (70.7 kg)   NEUROLOGIC: MENTAL STATUS:     08/28/2021    9:16 AM 07/17/2021    9:42 AM  Montreal Cognitive Assessment   Visuospatial/ Executive (0/5) 4 5  Naming (0/3) 3 3  Attention: Read list of digits (0/2) 2 2  Attention: Read list of letters (0/1) 1 1  Attention: Serial 7 subtraction starting at 100 (0/3) 3 3  Language: Repeat phrase (0/2) 2 2  Language : Fluency (0/1) 0 0  Abstraction (0/2) 2 2  Delayed Recall (0/5) 1 4  Orientation (0/6) 5 6  Total 23 28  Adjusted Score (based on education)  29    CRANIAL NERVE:  2nd, 3rd, 4th, 6th - pupils equal and reactive to light, visual fields full to confrontation, extraocular muscles intact, no nystagmus 5th - facial sensation symmetric 7th - facial strength symmetric 8th - hearing intact 9th - palate elevates symmetrically, uvula midline 11th - shoulder shrug symmetric 12th - tongue protrusion midline  MOTOR:  normal bulk and tone, no cogwheeling, full strength in the BUE, BLE  SENSORY:  normal and symmetric to light touch all 4 extremities  COORDINATION:  finger-nose-finger, fine finger movements normal, no tremor  REFLEXES:  deep tendon reflexes present and symmetric  GAIT/STATION:  normal     DIAGNOSTIC DATA (LABS, IMAGING, TESTING) - I reviewed patient records, labs,  notes, testing and imaging myself where available.  Lab Results  Component Value Date   WBC 11.2 (H) 07/03/2015   HGB 13.4 07/03/2015   HCT 40.3 07/03/2015   MCV 88.8 07/03/2015   PLT 201 07/03/2015      Component Value Date/Time   NA 135 07/03/2015 1835   K 4.1 07/03/2015 1835   CL 102 07/03/2015 1835   CO2 23 07/03/2015 1835   GLUCOSE 389 (H) 07/03/2015 1835   BUN 16 07/03/2015 1835   CREATININE 0.88 07/03/2015 1835   CREATININE 0.64 04/19/2014 1335   CALCIUM 8.9 07/03/2015 1835   PROT 6.5 07/03/2015 1835   ALBUMIN 4.0 07/03/2015 1835   AST 20 07/03/2015 1835   ALT 19 07/03/2015 1835   ALKPHOS 61 07/03/2015 1835   BILITOT 0.8 07/03/2015 1835   GFRNONAA >60 07/03/2015 1835   GFRAA >60 07/03/2015 1835   No results found for: "CHOL", "HDL", "LDLCALC", "LDLDIRECT", "TRIG", "CHOLHDL" Lab Results  Component Value Date   HGBA1C (H) 10/16/2008    11.6 (NOTE) The ADA recommends the following therapeutic goal for glycemic control related to Hgb A1c measurement: Goal of therapy: <6.5 Hgb A1c  Reference: American Diabetes Association: Clinical Practice Recommendations 2010, Diabetes Care, 2010, 33: (Suppl  1).   TSH 04/2021: 2.42    ASSESSMENT AND PLAN  51 y.o. year old female with a history of hypothyroidism, migraines, HLD, DM who presents for evaluation of memory loss over the past 1-2 years. Her MOCA today is 23/30, which is worse than her prior score of 29/30 two months ago. However she does indicate that she had some help answering questions on the prior MOCA, and that score may have been artificially high. Her current presentation is consistent with mild cognitive impairment. This has improved somewhat since her stress levels have been reduced. Discussed how Topamax can contribute to cognitive issues, but she would prefer not to make changes to it at this time as it is helping with her migraines. Will check B12 level today and order MRI brain to assess for signs of  neurodegeneration  and/or significant vascular disease.    1. Mild cognitive impairment   2. Memory loss     PLAN: - Labs: B12 - MRI brain  - Follow up after testing is complete.   Orders Placed This Encounter  Procedures   MR BRAIN W WO CONTRAST   Vitamin B12    Meds ordered this encounter  Medications   LORazepam (ATIVAN) 0.5 MG tablet    Sig: Take 1-2 pills 30 minutes before MRI    Dispense:  2 tablet    Refill:  0    Return in about 7 months (around 03/29/2022).  I spent an average of 34 minutes chart reviewing and counseling the patient, with at least 50% of the time face to face with the patient.   Ocie Doyne, MD 08/28/21 9:58 AM  [facility] [address], [address] [place], [place] [address] [phone]

## 2021-08-28 NOTE — Patient Instructions (Addendum)
Plan: Brain MRI Blood work - B12  Mild cognitive impairment: A person with mild cognitive impairment (MCI) experiences memory problems greater than normally expected with aging, but not serious enough to interfere with daily activities.  The patient with MCI complains of difficulty with memory. Typically, the complaints include trouble remembering the names of people they met recently, trouble remembering the flow of a conversation, and an increased tendency to misplace things, or similar problems. In many cases, the individual will be aware of these difficulties and will compensate with increased reliance on notes and calendars.   Although there is an increased chances of going on to develop dementia, it is not possible currently to predict with certainty which patients with MCI will or will not go on to develop dementia. There is currently no specific treatment for MCI. People leading sedentary lifestyles are at greater risk for developing dementia. Increased physical activity and brain exercise can help with maintaining brain function.  Tasks to improve attention/working memory 1. Good sleep hygiene (7-8 hrs of sleep) 2. Learning a new skill (Painting, Carpentry, Pottery, new language, Knitting). 3.Cognitive exercises (keep a daily journal, Puzzles) 4. Physical exercise and training  (30 min/day X 4 days week) 5. Being on Antidepressant if needed 6.Yoga, Meditation, Tai Chi 7. Decrease alcohol intake 8.Have a clear schedule and structure in daily routine  MIND Diet: The [place] Diet Intervention for Neurodegenerative Delay, or MIND diet, targets the health of the aging brain. Research participants with the highest MIND diet scores had a significantly slower rate of cognitive decline compared with those with the lowest scores. The effects of the MIND diet on cognition showed greater effects than either the Mediterranean or the DASH diet alone.  The healthy items the MIND diet  guidelines suggest include:  3+ servings a day of whole grains 1+ servings a day of vegetables (other than green leafy) 6+ servings a week of green leafy vegetables 5+ servings a week of nuts 4+ meals a week of beans 2+ servings a week of berries 2+ meals a week of poultry 1+ meals a week of fish Mainly olive oil if added fat is used  The unhealthy items, which are higher in saturated and trans fat, include: Less than 5 servings a week of pastries and sweets Less than 4 servings a week of red meat (including beef, pork, lamb, and products made from these meats) Less than one serving a week of cheese and fried foods Less than 1 tablespoon a day of butter/stick margarine

## 2021-08-29 ENCOUNTER — Other Ambulatory Visit: Payer: Self-pay | Admitting: Nurse Practitioner

## 2021-08-29 ENCOUNTER — Encounter: Payer: Self-pay | Admitting: Psychiatry

## 2021-08-29 DIAGNOSIS — M797 Fibromyalgia: Secondary | ICD-10-CM

## 2021-08-29 DIAGNOSIS — F32A Depression, unspecified: Secondary | ICD-10-CM

## 2021-08-29 LAB — VITAMIN B12: Vitamin B-12: 1658 pg/mL — ABNORMAL HIGH (ref 232–1245)

## 2021-09-02 ENCOUNTER — Encounter: Payer: Self-pay | Admitting: Obstetrics & Gynecology

## 2021-09-05 ENCOUNTER — Telehealth: Payer: Self-pay | Admitting: Psychiatry

## 2021-09-05 NOTE — Telephone Encounter (Signed)
45 mins MRI brain w/wo contrast Dr. Oda Kilts Berkley Harvey: [id number] exp. 09/05/21-10/04/21 scheduled at [facility] 09/11/21 at 9am

## 2021-09-06 DIAGNOSIS — E109 Type 1 diabetes mellitus without complications: Secondary | ICD-10-CM | POA: Diagnosis not present

## 2021-09-06 DIAGNOSIS — E1065 Type 1 diabetes mellitus with hyperglycemia: Secondary | ICD-10-CM | POA: Diagnosis not present

## 2021-09-06 DIAGNOSIS — E7800 Pure hypercholesterolemia, unspecified: Secondary | ICD-10-CM | POA: Diagnosis not present

## 2021-09-06 DIAGNOSIS — E039 Hypothyroidism, unspecified: Secondary | ICD-10-CM | POA: Diagnosis not present

## 2021-09-10 ENCOUNTER — Encounter: Payer: Self-pay | Admitting: Psychiatry

## 2021-09-11 ENCOUNTER — Ambulatory Visit: Payer: BC Managed Care – PPO

## 2021-09-11 DIAGNOSIS — R413 Other amnesia: Secondary | ICD-10-CM

## 2021-09-11 MED ORDER — GADOBENATE DIMEGLUMINE 529 MG/ML IV SOLN
15.0000 mL | Freq: Once | INTRAVENOUS | Status: AC | PRN
  Administered 2021-09-11: 15 mL via INTRAVENOUS

## 2021-11-12 ENCOUNTER — Other Ambulatory Visit: Payer: Self-pay | Admitting: Nurse Practitioner

## 2021-11-12 DIAGNOSIS — F32A Depression, unspecified: Secondary | ICD-10-CM

## 2021-11-12 DIAGNOSIS — M797 Fibromyalgia: Secondary | ICD-10-CM

## 2021-11-14 ENCOUNTER — Other Ambulatory Visit: Payer: Self-pay | Admitting: Nurse Practitioner

## 2021-11-14 DIAGNOSIS — G43009 Migraine without aura, not intractable, without status migrainosus: Secondary | ICD-10-CM

## 2021-11-14 DIAGNOSIS — F32A Depression, unspecified: Secondary | ICD-10-CM

## 2021-11-14 DIAGNOSIS — M797 Fibromyalgia: Secondary | ICD-10-CM

## 2021-11-14 MED ORDER — DULOXETINE HCL 30 MG PO CPEP: ORAL_CAPSULE | ORAL | 0 refills | Status: DC

## 2021-11-27 ENCOUNTER — Encounter: Payer: Self-pay | Admitting: Internal Medicine

## 2021-11-27 ENCOUNTER — Ambulatory Visit (INDEPENDENT_AMBULATORY_CARE_PROVIDER_SITE_OTHER): Payer: BC Managed Care – PPO | Admitting: Internal Medicine

## 2021-11-27 VITALS — BP 156/84 | HR 82 | Ht 62.0 in | Wt 146.0 lb

## 2021-11-27 DIAGNOSIS — E785 Hyperlipidemia, unspecified: Secondary | ICD-10-CM

## 2021-11-27 DIAGNOSIS — Z114 Encounter for screening for human immunodeficiency virus [HIV]: Secondary | ICD-10-CM | POA: Diagnosis not present

## 2021-11-27 DIAGNOSIS — Z1211 Encounter for screening for malignant neoplasm of colon: Secondary | ICD-10-CM | POA: Diagnosis not present

## 2021-11-27 DIAGNOSIS — Z0001 Encounter for general adult medical examination with abnormal findings: Secondary | ICD-10-CM

## 2021-11-27 DIAGNOSIS — E109 Type 1 diabetes mellitus without complications: Secondary | ICD-10-CM | POA: Diagnosis not present

## 2021-11-27 DIAGNOSIS — E559 Vitamin D deficiency, unspecified: Secondary | ICD-10-CM

## 2021-11-27 DIAGNOSIS — Z139 Encounter for screening, unspecified: Secondary | ICD-10-CM | POA: Diagnosis not present

## 2021-11-27 DIAGNOSIS — G43009 Migraine without aura, not intractable, without status migrainosus: Secondary | ICD-10-CM

## 2021-11-27 DIAGNOSIS — M797 Fibromyalgia: Secondary | ICD-10-CM | POA: Diagnosis not present

## 2021-11-27 DIAGNOSIS — M25531 Pain in right wrist: Secondary | ICD-10-CM | POA: Diagnosis not present

## 2021-11-27 DIAGNOSIS — E039 Hypothyroidism, unspecified: Secondary | ICD-10-CM

## 2021-11-27 DIAGNOSIS — R030 Elevated blood-pressure reading, without diagnosis of hypertension: Secondary | ICD-10-CM

## 2021-11-27 DIAGNOSIS — F32A Depression, unspecified: Secondary | ICD-10-CM

## 2021-11-27 DIAGNOSIS — Z1159 Encounter for screening for other viral diseases: Secondary | ICD-10-CM

## 2021-11-27 DIAGNOSIS — E1065 Type 1 diabetes mellitus with hyperglycemia: Secondary | ICD-10-CM

## 2021-11-27 DIAGNOSIS — E7800 Pure hypercholesterolemia, unspecified: Secondary | ICD-10-CM

## 2021-11-27 MED ORDER — DICLOFENAC SODIUM 75 MG PO TBEC: 75.0000 mg | DELAYED_RELEASE_TABLET | Freq: Every day | ORAL | 3 refills | Status: DC

## 2021-11-27 MED ORDER — TOPIRAMATE 25 MG PO TABS: 25.0000 mg | ORAL_TABLET | Freq: Two times a day (BID) | ORAL | 3 refills | Status: DC

## 2021-11-27 MED ORDER — DULOXETINE HCL 30 MG PO CPEP: ORAL_CAPSULE | ORAL | 3 refills | Status: DC

## 2021-11-27 NOTE — Patient Instructions (Signed)
It was a pleasure to see you today.  Thank you for giving us the opportunity to be involved in your care.  Below is a brief recap of your visit and next steps.  We will plan to see you again in 3 months.  Summary You have established care today We will check labs and plan for follow up in 3 months.  

## 2021-11-27 NOTE — Progress Notes (Signed)
New Patient Office Visit  Subjective    Patient ID: [name], female    DOB: July 17, 1970  Age: 51 y.o. MRN: [id number] y.o. MRN: [id number]  CC:  Chief Complaint  Patient presents with   Establish Care   Medication Refill   HPI [name] presents to establish care.  She is a 51 year old woman with a past medical history significant for type 1 diabetes, hypothyroidism, HLD, fibromyalgia, migraines, depression, and vitamin D deficiency.  [name] reports feeling well today.  She is asymptomatic and has no acute concerns aside from wanting to establish care.  Chronic medical conditions and outstanding preventative care items discussed today are individually addressed in A/P below.  Outpatient Encounter Medications as of 11/27/2021  Medication Sig   insulin aspart (NOVOLOG) 100 UNIT/ML injection by Pump Prime route continuous.   Insulin Human (INSULIN PUMP) SOLN Inject into the skin continuous.    levothyroxine (SYNTHROID) 75 MCG tablet Take 1 tablet by mouth daily.   OVER THE COUNTER MEDICATION Vitamin b   rosuvastatin (CRESTOR) 5 MG tablet Take 5 mg by mouth. Three times a week   Vitamin D, Ergocalciferol, (DRISDOL) 1.25 MG (50000 UNIT) CAPS capsule Take 50,000 Units by mouth once a week.   [DISCONTINUED] diclofenac (VOLTAREN) 75 MG EC tablet Take 1 tablet (75 mg total) by mouth daily.   [DISCONTINUED] DULoxetine (CYMBALTA) 30 MG capsule TAKE (1) CAPSULE BY MOUTH ONCE DAILY. Strength: 30 mg   [DISCONTINUED] insulin lispro (HUMALOG) 100 UNIT/ML injection SMARTSIG:0-150 Unit(s) SUB-Q Daily   [DISCONTINUED] topiramate (TOPAMAX) 25 MG tablet Take 1 tablet (25 mg total) by mouth 2 (two) times daily.   diclofenac (VOLTAREN) 75 MG EC tablet Take 1 tablet (75 mg total) by mouth daily.   DULoxetine (CYMBALTA) 30 MG capsule TAKE (1) CAPSULE BY MOUTH ONCE DAILY. Strength: 30 mg.   topiramate (TOPAMAX) 25 MG tablet Take 1 tablet (25 mg total) by mouth 2 (two) times daily.   [DISCONTINUED]  DULoxetine (CYMBALTA) 30 MG capsule TAKE (1) CAPSULE BY MOUTH ONCE DAILY. Strength: 30 mg  TAKE (1) CAPSULE BY MOUTH ONCE DAILY. Strength: 30 mg   [DISCONTINUED] LORazepam (ATIVAN) 0.5 MG tablet Take 1-2 pills 30 minutes before MRI   [DISCONTINUED] simvastatin (ZOCOR) 10 MG tablet Take 10 mg by mouth daily. Dr. Chalmers Cater   No facility-administered encounter medications on file as of 11/27/2021.   Past Medical History:  Diagnosis Date   Diabetes ([place])    Fibromyalgia    High cholesterol    Thyroid disease    Past Surgical History:  Procedure Laterality Date   caesarean     Family History  Problem Relation Age of Onset   Cancer Paternal Grandfather    Cancer Paternal Grandmother    Cancer Maternal Grandmother    Cancer Maternal Grandfather    Heart attack Father    Thyroid disease Sister    Fibromyalgia Sister    Diabetes Other    Social History   Socioeconomic History   Marital status: Married    Spouse name: Dvid   Number of children: 1   Years of education: Not on file   Highest education level: Associate degree: occupational, Hotel manager, or vocational program  Occupational History    Comment: part time, 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  Tobacco Use   Smoking status: Never   Smokeless tobacco: Never  Vaping Use   Vaping Use: Never used  Substance and Sexual Activity   Alcohol use: Yes    Comment: twice a week   Drug  use: No   Sexual activity: Yes    Birth control/protection: Surgical    Comment: vasectomy  Other Topics Concern   Not on file  Social History Narrative   08/28/21 Lives with husband   Caffeine- coffee 2 c, Coke Zeros 4-5 cans a day   Social Determinants of Health   Financial Resource Strain: Low Risk  (03/25/2021)   Overall Financial Resource Strain (CARDIA)    Difficulty of Paying Living Expenses: Not hard at all  Food Insecurity: No Food Insecurity (03/25/2021)   Hunger Vital Sign    Worried About Running Out of Food in the Last Year: Never true     Ran Out of Food in the Last Year: Never true  Transportation Needs: No Transportation Needs (03/25/2021)   PRAPARE - Hydrologist (Medical): No    Lack of Transportation (Non-Medical): No  Physical Activity: Insufficiently Active (03/25/2021)   Exercise Vital Sign    Days of Exercise per Week: 2 days    Minutes of Exercise per Session: 20 min  Stress: No Stress Concern Present (03/25/2021)   [place]    Feeling of Stress : Only a little  Social Connections: Moderately Integrated (03/25/2021)   Social Connection and Isolation Panel [NHANES]    Frequency of Communication with Friends and Family: Three times a week    Frequency of Social Gatherings with Friends and Family: Three times a week    Attends Religious Services: More than 4 times per year    Active Member of Clubs or Organizations: No    Attends Archivist Meetings: Never    Marital Status: Married  Human resources officer Violence: Not At Risk (03/25/2021)   Humiliation, Afraid, Rape, and Kick questionnaire    Fear of Current or Ex-Partner: No    Emotionally Abused: No    Physically Abused: No    Sexually Abused: No   Review of Systems  Constitutional:  Negative for chills and fever.  HENT:  Negative for sore throat.   Respiratory:  Negative for cough and shortness of breath.   Cardiovascular:  Negative for chest pain, palpitations and leg swelling.  Gastrointestinal:  Negative for abdominal pain, blood in stool, constipation, diarrhea, nausea and vomiting.  Genitourinary:  Negative for dysuria and hematuria.  Musculoskeletal:  Negative for myalgias.  Skin:  Negative for itching and rash.  Neurological:  Negative for dizziness and headaches.  Psychiatric/Behavioral:  Negative for depression and suicidal ideas.    Objective    BP (!) 156/84   Pulse 82   Ht _0  (1.575 m)   Wt 146 lb (66.2 kg)   SpO2 98%   BMI 26.70 kg/m    Physical Exam Vitals reviewed.  Constitutional:      General: She is not in acute distress.    Appearance: Normal appearance. She is not toxic-appearing.  HENT:     Head: Normocephalic and atraumatic.     Right Ear: External ear normal.     Left Ear: External ear normal.     Nose: Nose normal. No congestion or rhinorrhea.     Mouth/Throat:     Mouth: Mucous membranes are moist.     Pharynx: Oropharynx is clear. No oropharyngeal exudate or posterior oropharyngeal erythema.  Eyes:     General: No scleral icterus.    Extraocular Movements: Extraocular movements intact.     Conjunctiva/sclera: Conjunctivae normal.     Pupils: Pupils are equal,  round, and reactive to light.  Cardiovascular:     Rate and Rhythm: Normal rate and regular rhythm.     Pulses: Normal pulses.     Heart sounds: Normal heart sounds. No murmur heard.    No friction rub. No gallop.  Pulmonary:     Effort: Pulmonary effort is normal.     Breath sounds: Normal breath sounds. No wheezing, rhonchi or rales.  Abdominal:     General: Abdomen is flat. Bowel sounds are normal. There is no distension.     Palpations: Abdomen is soft.     Tenderness: There is no abdominal tenderness.  Musculoskeletal:        General: No swelling. Normal range of motion.     Cervical back: Normal range of motion.     Right lower leg: No edema.     Left lower leg: No edema.  Lymphadenopathy:     Cervical: No cervical adenopathy.  Skin:    General: Skin is warm and dry.     Capillary Refill: Capillary refill takes less than 2 seconds.     Coloration: Skin is not jaundiced.  Neurological:     General: No focal deficit present.     Mental Status: She is alert and oriented to person, place, and time.  Psychiatric:        Mood and Affect: Mood normal.        Behavior: Behavior normal.    Last CBC Lab Results  Component Value Date   WBC 7.3 11/27/2021   HGB 15.5 11/27/2021   HCT 45.7 11/27/2021   MCV 90 11/27/2021   MCH  30.5 11/27/2021   RDW 12.8 11/27/2021   PLT 342 36/46/8032   Last metabolic panel Lab Results  Component Value Date   GLUCOSE 164 (H) 11/27/2021   NA 141 11/27/2021   K 3.9 11/27/2021   CL 105 11/27/2021   CO2 19 (L) 11/27/2021   BUN 7 11/27/2021   CREATININE 0.67 11/27/2021   EGFR 104 11/27/2021   CALCIUM 9.9 11/27/2021   PHOS 2.7 09/24/2008   PROT 6.9 11/27/2021   ALBUMIN 4.7 11/27/2021   LABGLOB 2.2 11/27/2021   AGRATIO 2.1 11/27/2021   BILITOT 0.4 11/27/2021   ALKPHOS 101 11/27/2021   AST 13 11/27/2021   ALT 10 11/27/2021   ANIONGAP 10 07/03/2015   Last hemoglobin A1c Lab Results  Component Value Date   HGBA1C 8.5 (H) 11/27/2021   Last thyroid functions Lab Results  Component Value Date   TSH 2.620 11/27/2021   Last vitamin B12 and Folate Lab Results  Component Value Date   ZYYQMGNO03 704 11/27/2021   FOLATE 8.1 11/27/2021   Assessment & Plan:   Problem List Items Addressed This Visit       Migraines    Currently prescribed Topamax 25 mg twice daily for headache prevention.  She denies recent migraines.      Type 1 diabetes mellitus without complications ([place])    She cannot recall her most recent A1c value.  She has an insulin pump and is followed by endocrinology.  Last seen in August. -Repeat A1c ordered today -Diabetic foot exam and urine microalbumin/creatinine ratio deferred       Hypothyroidism    She is currently prescribed levothyroxine 75 mcg daily.  Asymptomatic currently.  This is managed by endocrinology.      Fibromyalgia affecting multiple sites    Symptoms well controlled with Cymbalta 30 mg daily and diclofenac 75 mg daily.  No medication  changes today.      Hyperlipidemia    Currently prescribed rosuvastatin 5 mg daily.  Lipid panel recently updated by former provider and demonstrates adequate control.  No changes today.      Vitamin D deficiency    She has a history of vitamin D deficiency and is currently on weekly,  high-dose supplementation.  -Repeat vitamin D level upon completion of high-dose supplementation.      Elevated blood pressure reading    BP 156/84 today.  She has no previous diagnosis of hypertension.  She attributes her elevated reading to stress due to a recent separation from her husband. -Continue to monitor BP at follow-up      Encounter for well adult exam with abnormal findings    Presenting today to establish care.  Previous records and labs reviewed. -Repeat labs ordered today, including one-time HIV/HCV screenings -Outstanding vaccines were declined today -Cologuard has been ordered for colorectal cancer screening      Return in about 3 months (around 02/27/2022).   Johnette Abraham, MD

## 2021-11-28 ENCOUNTER — Ambulatory Visit: Payer: Self-pay | Admitting: Obstetrics & Gynecology

## 2021-11-28 DIAGNOSIS — Z029 Encounter for administrative examinations, unspecified: Secondary | ICD-10-CM

## 2021-11-28 LAB — CMP14+EGFR
ALT: 10 IU/L (ref 0–32)
AST: 13 IU/L (ref 0–40)
Albumin/Globulin Ratio: 2.1 (ref 1.2–2.2)
Albumin: 4.7 g/dL (ref 3.8–4.9)
Alkaline Phosphatase: 101 IU/L (ref 44–121)
BUN/Creatinine Ratio: 10 (ref 9–23)
BUN: 7 mg/dL (ref 6–24)
Bilirubin Total: 0.4 mg/dL (ref 0.0–1.2)
CO2: 19 mmol/L — ABNORMAL LOW (ref 20–29)
Calcium: 9.9 mg/dL (ref 8.7–10.2)
Chloride: 105 mmol/L (ref 96–106)
Creatinine, Ser: 0.67 mg/dL (ref 0.57–1.00)
Globulin, Total: 2.2 g/dL (ref 1.5–4.5)
Glucose: 164 mg/dL — ABNORMAL HIGH (ref 70–99)
Potassium: 3.9 mmol/L (ref 3.5–5.2)
Sodium: 141 mmol/L (ref 134–144)
Total Protein: 6.9 g/dL (ref 6.0–8.5)
eGFR: 104 mL/min/{1.73_m2} (ref >59)

## 2021-11-28 LAB — CBC WITH DIFFERENTIAL/PLATELET
Basophils Absolute: 0.1 10*3/uL (ref 0.0–0.2)
Basos: 1 %
EOS (ABSOLUTE): 0.3 10*3/uL (ref 0.0–0.4)
Eos: 4 %
Hematocrit: 45.7 % (ref 34.0–46.6)
Hemoglobin: 15.5 g/dL (ref 11.1–15.9)
Immature Grans (Abs): 0 10*3/uL (ref 0.0–0.1)
Immature Granulocytes: 0 %
Lymphocytes Absolute: 2 10*3/uL (ref 0.7–3.1)
Lymphs: 27 %
MCH: 30.5 pg (ref 26.6–33.0)
MCHC: 33.9 g/dL (ref 31.5–35.7)
MCV: 90 fL (ref 79–97)
Monocytes Absolute: 0.5 10*3/uL (ref 0.1–0.9)
Monocytes: 7 %
Neutrophils Absolute: 4.4 10*3/uL (ref 1.4–7.0)
Neutrophils: 61 %
Platelets: 342 10*3/uL (ref 150–450)
RBC: 5.08 x10E6/uL (ref 3.77–5.28)
RDW: 12.8 % (ref 11.7–15.4)
WBC: 7.3 10*3/uL (ref 3.4–10.8)

## 2021-11-28 LAB — B12 AND FOLATE PANEL
Folate: 8.1 ng/mL (ref >3.0)
Vitamin B-12: 856 pg/mL (ref 232–1245)

## 2021-11-28 LAB — TSH+FREE T4
Free T4: 1.37 ng/dL (ref 0.82–1.77)
TSH: 2.62 u[IU]/mL (ref 0.450–4.500)

## 2021-11-28 LAB — HIV ANTIBODY (ROUTINE TESTING W REFLEX): HIV Screen 4th Generation wRfx: NONREACTIVE

## 2021-11-28 LAB — HCV INTERPRETATION

## 2021-11-28 LAB — HCV AB W REFLEX TO QUANT PCR: HCV Ab: NONREACTIVE

## 2021-11-28 LAB — HEMOGLOBIN A1C
Est. average glucose Bld gHb Est-mCnc: 197 mg/dL
Hgb A1c MFr Bld: 8.5 % — ABNORMAL HIGH (ref 4.8–5.6)

## 2021-12-03 DIAGNOSIS — R030 Elevated blood-pressure reading, without diagnosis of hypertension: Secondary | ICD-10-CM | POA: Insufficient documentation

## 2021-12-03 DIAGNOSIS — G43909 Migraine, unspecified, not intractable, without status migrainosus: Secondary | ICD-10-CM | POA: Insufficient documentation

## 2021-12-03 DIAGNOSIS — Z0001 Encounter for general adult medical examination with abnormal findings: Secondary | ICD-10-CM | POA: Insufficient documentation

## 2021-12-03 NOTE — Assessment & Plan Note (Signed)
Presenting today to establish care.  Previous records and labs reviewed. -Repeat labs ordered today, including one-time HIV/HCV screenings -Outstanding vaccines were declined today -Cologuard has been ordered for colorectal cancer screening

## 2021-12-03 NOTE — Assessment & Plan Note (Signed)
Currently prescribed rosuvastatin 5 mg daily.  Lipid panel recently updated by former provider and demonstrates adequate control.  No changes today.

## 2021-12-03 NOTE — Assessment & Plan Note (Signed)
She cannot recall her most recent A1c value.  She has an insulin pump and is followed by endocrinology.  Last seen in August. -Repeat A1c ordered today -Diabetic foot exam and urine microalbumin/creatinine ratio deferred

## 2021-12-03 NOTE — Assessment & Plan Note (Signed)
Symptoms well controlled with Cymbalta 30 mg daily and diclofenac 75 mg daily.  No medication changes today.

## 2021-12-03 NOTE — Assessment & Plan Note (Signed)
She has a history of vitamin D deficiency and is currently on weekly, high-dose supplementation.  -Repeat vitamin D level upon completion of high-dose supplementation.

## 2021-12-03 NOTE — Assessment & Plan Note (Signed)
She is currently prescribed levothyroxine 75 mcg daily.  Asymptomatic currently.  This is managed by endocrinology.

## 2021-12-03 NOTE — Assessment & Plan Note (Signed)
Currently prescribed Topamax 25 mg twice daily for headache prevention.  She denies recent migraines.

## 2021-12-03 NOTE — Assessment & Plan Note (Signed)
BP 156/84 today.  She has no previous diagnosis of hypertension.  She attributes her elevated reading to stress due to a recent separation from her husband. -Continue to monitor BP at follow-up

## 2021-12-18 ENCOUNTER — Ambulatory Visit (INDEPENDENT_AMBULATORY_CARE_PROVIDER_SITE_OTHER): Payer: BC Managed Care – PPO | Admitting: Orthopedic Surgery

## 2021-12-18 ENCOUNTER — Ambulatory Visit (INDEPENDENT_AMBULATORY_CARE_PROVIDER_SITE_OTHER): Payer: BC Managed Care – PPO

## 2021-12-18 DIAGNOSIS — M659 Synovitis and tenosynovitis, unspecified: Secondary | ICD-10-CM

## 2021-12-18 DIAGNOSIS — M79641 Pain in right hand: Secondary | ICD-10-CM

## 2021-12-18 NOTE — Progress Notes (Signed)
Chief Complaint  Patient presents with   Hand Pain    States right hand stiff and pain in fingers Middle ring and pinky thumb painful but different from last visit    Dr Talmage Nap is endocrinologist   [name] has some pain over the first extensor compartment of the right wrist but is now having pain in the palm of the hand ring and long finger with pain in the forearm on the volar aspect and some numbness and tingling also noted in the ring and long finger  Reexamination reveals that she has tenderness in the volar aspect of the forearm over the carpal tunnel and then over the A1 pulleys of the ring and long finger  She has pain and decreased range of motion with flexion of the hand and wrist  X-ray today was negative except for cystic structure noted around the radius near the styloid  Apparently has developed some synovitis.  She is diabetic she is on insulin  I think she will need to go into a splint and start or increase her Voltaren to 75 mg twice a day  If this is not successful then we can talk to the endocrinologist about prednisone.

## 2021-12-27 DIAGNOSIS — Z1211 Encounter for screening for malignant neoplasm of colon: Secondary | ICD-10-CM | POA: Diagnosis not present

## 2021-12-29 ENCOUNTER — Ambulatory Visit
Admission: EM | Admit: 2021-12-29 | Discharge: 2021-12-29 | Disposition: A | Discharge status: Home / Self Care | Payer: BC Managed Care – PPO | Attending: Nurse Practitioner | Admitting: Nurse Practitioner

## 2021-12-29 DIAGNOSIS — J029 Acute pharyngitis, unspecified: Secondary | ICD-10-CM

## 2021-12-29 DIAGNOSIS — J069 Acute upper respiratory infection, unspecified: Secondary | ICD-10-CM | POA: Insufficient documentation

## 2021-12-29 DIAGNOSIS — Z1152 Encounter for screening for COVID-19: Secondary | ICD-10-CM | POA: Diagnosis not present

## 2021-12-29 LAB — RESP PANEL BY RT-PCR (FLU A&B, COVID) ARPGX2
Influenza A by PCR: NEGATIVE
Influenza B by PCR: NEGATIVE
SARS Coronavirus 2 by RT PCR: NEGATIVE

## 2021-12-29 LAB — POCT RAPID STREP A (OFFICE): Rapid Strep A Screen: NEGATIVE

## 2021-12-29 MED ORDER — PSEUDOEPH-BROMPHEN-DM 30-2-10 MG/5ML PO SYRP: 5.0000 mL | ORAL_SOLUTION | Freq: Four times a day (QID) | ORAL | 0 refills | Status: DC | PRN

## 2021-12-29 NOTE — Discharge Instructions (Signed)
Rapid strep test is negative. COVID/flu test and throat culture are pending. You will be contacted if the results of the pending tests are positive. As discussed, you are a candidate to receive molnupiravir if the COVID result is positive.You can access your results via MyChart. Take medication as prescribed. Increase fluids and allow for plenty of rest. Recommend Tylenol or ibuprofen as needed for pain, fever, or general discomfort. Warm salt water gargles 3-4 times daily to help with throat pain or discomfort. Recommend using a humidifier at bedtime during sleep to help with cough and nasal congestion. Sleep elevated on 2 pillows while cough symptoms persist. A viral infection can last up to 14 days. If you develop new symptoms or symptoms suddenly worsen, please follow-up with your primary care physician or in this clinic.

## 2021-12-29 NOTE — ED Provider Notes (Signed)
[facility]    CSN: [id number] Arrival date & time: 12/29/21  0808      History   Chief Complaint Chief Complaint  Patient presents with   Sore Throat   Cough    HPI [name] is a 51 y.o. female.   The history is provided by the patient.   Patient with a 2-day history of cough and sore throat.  Patient denies fever, chills, ear pain, shortness of breath, difficulty breathing, or GI symptoms.  Patient reports that she has also had intermittent wheezing.  She states that her cough is currently nonproductive, but feels like something should come up.  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  She has not been vaccinated for influenza or COVID.  She reports she has not been taking any medication for the cough as she is diabetic and is not sure what she should be taking.  Past Medical History:  Diagnosis Date   Diabetes (HCC)    Fibromyalgia    High cholesterol    Thyroid disease     Patient Active Problem List   Diagnosis Date Noted   Migraines 12/03/2021   Elevated blood pressure reading 12/03/2021   Encounter for well adult exam with abnormal findings 12/03/2021   Connective tissue disease (HCC) 08/15/2021   Diabetic retinopathy (HCC) 08/15/2021   Hyperlipidemia 08/15/2021   Hyperglycemia due to type 1 diabetes mellitus (HCC) 08/15/2021   Hypothyroidism 08/15/2021   Other specified abnormal immunological findings in serum 08/15/2021   Presence of insulin pump (external) (internal) 08/15/2021   Vitamin D deficiency 08/15/2021   Fibromyalgia affecting multiple sites 08/12/2020   Depression 08/12/2020   Other complicated headache syndrome 08/12/2020   Trigger finger of left thumb 06/10/2016   Pain in joint, shoulder region 10/12/2012   Muscle weakness (generalized) 10/12/2012   Arthritis, shoulder region 10/06/2012   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 Authorizing Provider  brompheniramine-pseudoephedrine-DM 30-2-10 MG/5ML syrup Take 5 mLs by mouth 4 (four) times daily as needed. 12/29/21  Yes Kyesha Balla-Warren, Sadie Haber, NP  diclofenac (VOLTAREN) 75 MG EC tablet Take 1 tablet (75 mg total) by mouth daily. 11/27/21   [name], MD  DULoxetine (CYMBALTA) 30 MG capsule TAKE (1) CAPSULE BY MOUTH ONCE DAILY. Strength: 30 mg. 11/27/21   [name], MD  insulin aspart (NOVOLOG) 100 UNIT/ML injection by Pump Prime route continuous.    [provider]  Insulin Human (INSULIN PUMP) SOLN Inject into the skin continuous.     [provider]  levothyroxine (SYNTHROID) 75 MCG tablet Take 1 tablet by mouth daily.  [provider]  OVER THE COUNTER MEDICATION Vitamin b    [provider]  rosuvastatin (CRESTOR) 5 MG tablet Take 5 mg by mouth. Three times a week 12/12/19   [provider]  topiramate (TOPAMAX) 25 MG tablet Take 1 tablet (25 mg total) by mouth 2 (two) times daily. 11/27/21   Johnette Abraham, MD  Vitamin D, Ergocalciferol, (DRISDOL) 1.25 MG (50000 UNIT) CAPS capsule Take 50,000 Units by mouth once a week. 12/12/19   [provider]  simvastatin (ZOCOR) 10 MG tablet Take 10 mg by mouth daily. Dr. Chalmers Cater  01/16/19  [provider]    Family History Family History  Problem Relation Age of Onset   Cancer Paternal Grandfather    Cancer Paternal Grandmother    Cancer Maternal Grandmother     Cancer Maternal Grandfather    Heart attack Father    Thyroid disease Sister    Fibromyalgia Sister    Diabetes Other     Social History Social History   Tobacco Use   Smoking status: Never   Smokeless tobacco: Never  Vaping Use   Vaping Use: Never used  Substance Use Topics   Alcohol use: Yes    Comment: twice a week   Drug use: No     Allergies   Patient has no known allergies.   Review of Systems Review of Systems Per HPI  Physical Exam Triage Vital Signs ED Triage Vitals  Enc Vitals Group     BP 12/29/21 0816 121/80     Pulse Rate 12/29/21 0816 100     Resp 12/29/21 0816 16     Temp 12/29/21 0816 98.3 F (36.8 C)     Temp Source 12/29/21 0816 Oral     SpO2 12/29/21 0816 98 %     Weight --      Height --      Head Circumference --      Peak Flow --      Pain Score 12/29/21 0815 8     Pain Loc --      Pain Edu? --      Excl. in [place]? --    No data found.  Updated Vital Signs BP 121/80 (BP Location: Left Arm)   Pulse 100   Temp 98.3 F (36.8 C) (Oral)   Resp 16   LMP  (Within Weeks) Comment: 1 1/2 weeks  SpO2 98%   Visual Acuity Right Eye Distance:   Left Eye Distance:   Bilateral Distance:    Right Eye Near:   Left Eye Near:    Bilateral Near:     Physical Exam Vitals and nursing note reviewed.  Constitutional:      General: She is not in acute distress.    Appearance: She is well-developed.  HENT:     Head: Normocephalic.     Right Ear: Tympanic membrane and ear canal normal.     Left Ear: Tympanic membrane and ear canal normal.     Nose: Congestion present. No rhinorrhea.     Mouth/Throat:     Mouth: Mucous membranes are moist. No oral lesions.     Pharynx: Posterior oropharyngeal erythema present. No pharyngeal swelling.  Eyes:     Conjunctiva/sclera: Conjunctivae normal.     Pupils: Pupils are equal, round, and reactive to light.  Cardiovascular:     Rate and Rhythm: Normal rate and regular rhythm.     Heart sounds: Normal  heart sounds.  Pulmonary:  Effort: Pulmonary effort is normal. No respiratory distress.     Breath sounds: Normal breath sounds. No stridor. No wheezing, rhonchi or rales.  Abdominal:     General: Bowel sounds are normal.     Palpations: Abdomen is soft.     Tenderness: There is no abdominal tenderness.  Musculoskeletal:     Cervical back: Normal range of motion.  Lymphadenopathy:     Cervical: No cervical adenopathy.  Skin:    General: Skin is warm and dry.  Neurological:     General: No focal deficit present.     Mental Status: She is alert and oriented to person, place, and time.  Psychiatric:        Mood and Affect: Mood normal.        Behavior: Behavior normal.      UC Treatments / Results  Labs (all labs ordered are listed, but only abnormal results are displayed) Labs Reviewed  RESP PANEL BY RT-PCR (FLU A&B, COVID) ARPGX2  CULTURE, GROUP A STREP [facility])  POCT RAPID STREP A (OFFICE)    EKG   Radiology No results found.  Procedures Procedures (including critical care time)  Medications Ordered in UC Medications - No data to display  Initial Impression / Assessment and Plan / UC Course  I have reviewed the triage vital signs and the nursing notes.  Pertinent labs & imaging results that were available during my care of the patient were reviewed by me and considered in my medical decision making (see chart for details).  Patient is well-appearing, she is in no acute distress, vital signs are stable.  Rapid strep test is negative, throat culture and COVID/flu test are pending.  Patient is a candidate to receive molnupiravir if her COVID test is positive.  Suspect symptoms are likely a viral upper respiratory infection with cough.  Patient was prescribed Bromfed-DM to help with her cough throughout the day.  Patient was also given supportive care recommendations to include continuing Tylenol or ibuprofen as needed for pain or discomfort, using a humidifier during  bedtime, and sleeping elevated on pillows.  Patient was given strict return precautions.  Patient verbalizes understanding.  All questions were answered.  Patient stable for discharge. Final Clinical Impressions(s) / UC Diagnoses   Final diagnoses:  Viral upper respiratory tract infection with cough     Discharge Instructions      Rapid strep test is negative. COVID/flu test and throat culture are pending. You will be contacted if the results of the pending tests are positive. As discussed, you are a candidate to receive molnupiravir if the COVID result is positive.You can access your results via [facility]. Take medication as prescribed. Increase fluids and allow for plenty of rest. Recommend Tylenol or ibuprofen as needed for pain, fever, or general discomfort. Warm salt water gargles 3-4 times daily to help with throat pain or discomfort. Recommend using a humidifier at bedtime during sleep to help with cough and nasal congestion. Sleep elevated on 2 pillows while cough symptoms persist. A viral infection can last up to 14 days. If you develop new symptoms or symptoms suddenly worsen, please follow-up with your primary care physician or in this clinic.     ED Prescriptions     Medication Sig Dispense Auth. Provider   brompheniramine-pseudoephedrine-DM 30-2-10 MG/5ML syrup Take 5 mLs by mouth 4 (four) times daily as needed. 140 mL Lugenia Assefa-Warren, Alda Lea, NP      PDMP not reviewed this encounter.   James Lafalce-Warren, Alda Lea, NP  12/29/21 0841  

## 2021-12-29 NOTE — ED Triage Notes (Signed)
Pt reports sore throat and cough x 2 days. Reports she is Type 1 Diabetic and he blood sugar is up and down since she got sick.

## 2022-01-01 ENCOUNTER — Other Ambulatory Visit: Payer: Self-pay

## 2022-01-01 ENCOUNTER — Encounter: Payer: Self-pay | Admitting: Internal Medicine

## 2022-01-01 DIAGNOSIS — F32A Depression, unspecified: Secondary | ICD-10-CM

## 2022-01-01 LAB — CULTURE, GROUP A STREP (THRC)

## 2022-01-04 LAB — COLOGUARD: COLOGUARD: NEGATIVE

## 2022-01-07 DIAGNOSIS — E103593 Type 1 diabetes mellitus with proliferative diabetic retinopathy without macular edema, bilateral: Secondary | ICD-10-CM | POA: Diagnosis not present

## 2022-01-07 DIAGNOSIS — H43822 Vitreomacular adhesion, left eye: Secondary | ICD-10-CM | POA: Diagnosis not present

## 2022-01-07 DIAGNOSIS — H2512 Age-related nuclear cataract, left eye: Secondary | ICD-10-CM | POA: Diagnosis not present

## 2022-01-07 DIAGNOSIS — Z961 Presence of intraocular lens: Secondary | ICD-10-CM | POA: Diagnosis not present

## 2022-01-07 DIAGNOSIS — Z0271 Encounter for disability determination: Secondary | ICD-10-CM

## 2022-01-08 ENCOUNTER — Ambulatory Visit: Payer: BC Managed Care – PPO | Admitting: Orthopedic Surgery

## 2022-01-28 DIAGNOSIS — E109 Type 1 diabetes mellitus without complications: Secondary | ICD-10-CM | POA: Diagnosis not present

## 2022-01-28 DIAGNOSIS — E7800 Pure hypercholesterolemia, unspecified: Secondary | ICD-10-CM | POA: Diagnosis not present

## 2022-01-28 DIAGNOSIS — E039 Hypothyroidism, unspecified: Secondary | ICD-10-CM | POA: Diagnosis not present

## 2022-01-28 DIAGNOSIS — E559 Vitamin D deficiency, unspecified: Secondary | ICD-10-CM | POA: Diagnosis not present

## 2022-02-04 DIAGNOSIS — E039 Hypothyroidism, unspecified: Secondary | ICD-10-CM | POA: Diagnosis not present

## 2022-02-04 DIAGNOSIS — E7800 Pure hypercholesterolemia, unspecified: Secondary | ICD-10-CM | POA: Diagnosis not present

## 2022-02-04 DIAGNOSIS — Z9641 Presence of insulin pump (external) (internal): Secondary | ICD-10-CM | POA: Diagnosis not present

## 2022-02-04 DIAGNOSIS — E1065 Type 1 diabetes mellitus with hyperglycemia: Secondary | ICD-10-CM | POA: Diagnosis not present

## 2022-03-03 ENCOUNTER — Ambulatory Visit: Payer: BC Managed Care – PPO | Admitting: Internal Medicine

## 2022-03-17 ENCOUNTER — Encounter (HOSPITAL_COMMUNITY): Payer: Self-pay | Admitting: Psychiatry

## 2022-03-17 ENCOUNTER — Ambulatory Visit (INDEPENDENT_AMBULATORY_CARE_PROVIDER_SITE_OTHER): Payer: BC Managed Care – PPO | Admitting: Psychiatry

## 2022-03-17 ENCOUNTER — Ambulatory Visit (INDEPENDENT_AMBULATORY_CARE_PROVIDER_SITE_OTHER): Payer: BC Managed Care – PPO | Admitting: Internal Medicine

## 2022-03-17 ENCOUNTER — Encounter: Payer: Self-pay | Admitting: Internal Medicine

## 2022-03-17 VITALS — BP 106/72 | HR 114 | Ht 62.0 in | Wt 140.4 lb

## 2022-03-17 DIAGNOSIS — E559 Vitamin D deficiency, unspecified: Secondary | ICD-10-CM | POA: Diagnosis not present

## 2022-03-17 DIAGNOSIS — F1090 Alcohol use, unspecified, uncomplicated: Secondary | ICD-10-CM

## 2022-03-17 DIAGNOSIS — F4323 Adjustment disorder with mixed anxiety and depressed mood: Secondary | ICD-10-CM

## 2022-03-17 DIAGNOSIS — F410 Panic disorder [episodic paroxysmal anxiety] without agoraphobia: Secondary | ICD-10-CM

## 2022-03-17 DIAGNOSIS — M797 Fibromyalgia: Secondary | ICD-10-CM | POA: Diagnosis not present

## 2022-03-17 DIAGNOSIS — Z789 Other specified health status: Secondary | ICD-10-CM

## 2022-03-17 DIAGNOSIS — R413 Other amnesia: Secondary | ICD-10-CM

## 2022-03-17 DIAGNOSIS — Z6981 Encounter for mental health services for victim of other abuse: Secondary | ICD-10-CM

## 2022-03-17 LAB — HM DIABETES EYE EXAM

## 2022-03-17 MED ORDER — THIAMINE HCL 100 MG PO TABS: 100.0000 mg | ORAL_TABLET | Freq: Every day | ORAL | 2 refills | Status: AC

## 2022-03-17 MED ORDER — DULOXETINE HCL 60 MG PO CPEP: 60.0000 mg | ORAL_CAPSULE | Freq: Every day | ORAL | 2 refills | Status: DC

## 2022-03-17 NOTE — Patient Instructions (Signed)
It was a pleasure to see you today.  Thank you for giving [place] the opportunity to be involved in your care.  Below is a brief recap of your visit and next steps.  We will plan to see you again in 4 weeks.   Summary Increase Cymbalta to 60 mg daily Start thiamine 100 mg daily supplementation II have placed a referral to integrated behavioral health for counseling services I recommend establishing care at [facility] for treatment of alcohol use disorder Follow up in 4 weeks

## 2022-03-17 NOTE — Patient Instructions (Signed)
We identified the alcohol use as a major source of worsening memory and worsening mood symptoms.  I will coordinate with your PCP to have you go to day [facility] recovery services to treat this.  I will also recommend to your PCP to increase the Cymbalta to 60 mg once a day and if it does make you sleepy switching to the morning may be better moving forward.  I will also recommend your PCP to get a nutrition referral and an to start thiamine supplement 100 mg once daily.  Call your insurer to find a psychotherapist that is in network for you.

## 2022-03-17 NOTE — Assessment & Plan Note (Signed)
She is currently drinking 5-6 shots of liquor daily and has been doing so for the past 2 months as she is going through a divorce.  I have expressed concern over her alcohol consumption today. -Start thiamine supplementation, 100 mg daily -I recommended that she establish care at [facility] in [place].  She is in agreement with this plan

## 2022-03-17 NOTE — Assessment & Plan Note (Signed)
Symptoms have been worse recently in the setting of going through divorce.  She denies SI/HI currently.  She has recently established care with psychiatry, who has recommended increasing Cymbalta to 60 mg daily. -Increase Cymbalta to 60 mg daily.  Follow-up in 1 month for reassessment.

## 2022-03-17 NOTE — Assessment & Plan Note (Signed)
She will follow-up with neurology next month for evaluation of memory loss.  Previous MRI brain was unremarkable.  She feels that her memory issues have been worse recently, likely due to increased alcohol consumption.  She is also prescribed Topamax, which could be contributing to memory impairment. -Addressing alcohol use disorder as noted above -Neurology follow-up next month

## 2022-03-17 NOTE — Progress Notes (Signed)
Established Patient Office Visit  Subjective   Patient ID: [name], female    DOB: [birth year]  Age: 52 y.o. MRN: [id number]  Chief Complaint  Patient presents with   Diabetes    Follow up   [name] returns to care today for routine follow-up.  She was last seen by me on 11/8 as a new patient presenting to establish care.  No medication changes were made at that time and 66-monthfollow-up was arranged.  In the interim she requested a referral to behavioral health in December, requesting to speak with someone as she has been going through a divorce.  She established care with psychiatry (Dr. SNehemiah Settle earlier today.  There have otherwise been no acute interval events.  Today [name]'s acute concern is her mental health while going through a divorce.  She recently left her husband, who she reports caused her significant emotional distress.  She believes this has contributed to her memory loss.  She also reports daily alcohol consumption, noting that she drinks 2 shots of liquor before work and then 2-3 shots after returning home at the end of the day.  She has been doing this for the past 2 months.  She denies a history of alcohol withdrawal but notes that she has never consumed alcohol at this frequency in the past.  She is open to any options for help today.  Past Medical History:  Diagnosis Date   Diabetes ([facility]    Fibromyalgia    High cholesterol    Thyroid disease    Past Surgical History:  Procedure Laterality Date   caesarean     Social History   Tobacco Use   Smoking status: Never   Smokeless tobacco: Never  Vaping Use   Vaping Use: Never used  Substance Use Topics   Alcohol use: Yes    Comment: 1 shot of liquor in the morning and 2 at night daily since to send first 2023.  In 20s heavy use of liquor after her first husband's death   Drug use: No   Family History  Problem Relation Age of Onset   Cancer Paternal Grandfather    Cancer Paternal  Grandmother    Cancer Maternal Grandmother    Cancer Maternal Grandfather    Heart attack Father    Thyroid disease Sister    Fibromyalgia Sister    Diabetes Other    No Known Allergies    Review of Systems  Constitutional:  Negative for chills and fever.  HENT:  Negative for sore throat.   Respiratory:  Negative for cough and shortness of breath.   Cardiovascular:  Negative for chest pain, palpitations and leg swelling.  Gastrointestinal:  Negative for abdominal pain, blood in stool, constipation, diarrhea, nausea and vomiting.  Genitourinary:  Negative for dysuria and hematuria.  Musculoskeletal:  Negative for myalgias.  Skin:  Negative for itching and rash.  Neurological:  Negative for dizziness and headaches.  Psychiatric/Behavioral:  Positive for depression and memory loss. Negative for suicidal ideas. The patient is nervous/anxious.      Objective:     BP 106/72   Pulse (!) 114   Ht '5\' 2"'$  (1.575 m)   Wt 140 lb 6.4 oz (63.7 kg)   SpO2 98%   BMI 25.68 kg/m  BP Readings from Last 3 Encounters:  03/17/22 106/72  12/29/21 121/80  11/27/21 (!) 156/84   Physical Exam Vitals reviewed.  Constitutional:      General: She is not in acute distress.  Appearance: Normal appearance. She is not toxic-appearing.  HENT:     Head: Normocephalic and atraumatic.     Right Ear: External ear normal.     Left Ear: External ear normal.     Nose: Nose normal. No congestion or rhinorrhea.     Mouth/Throat:     Mouth: Mucous membranes are moist.     Pharynx: Oropharynx is clear. No oropharyngeal exudate or posterior oropharyngeal erythema.  Eyes:     General: No scleral icterus.    Extraocular Movements: Extraocular movements intact.     Conjunctiva/sclera: Conjunctivae normal.     Pupils: Pupils are equal, round, and reactive to light.  Cardiovascular:     Rate and Rhythm: Normal rate and regular rhythm.     Pulses: Normal pulses.     Heart sounds: Normal heart sounds. No  murmur heard.    No friction rub. No gallop.  Pulmonary:     Effort: Pulmonary effort is normal.     Breath sounds: Normal breath sounds. No wheezing, rhonchi or rales.  Abdominal:     General: Abdomen is flat. Bowel sounds are normal. There is no distension.     Palpations: Abdomen is soft.     Tenderness: There is no abdominal tenderness.  Musculoskeletal:        General: No swelling. Normal range of motion.     Cervical back: Normal range of motion.     Right lower leg: No edema.     Left lower leg: No edema.  Lymphadenopathy:     Cervical: No cervical adenopathy.  Skin:    General: Skin is warm and dry.     Capillary Refill: Capillary refill takes less than 2 seconds.     Coloration: Skin is not jaundiced.  Neurological:     General: No focal deficit present.     Mental Status: She is alert and oriented to person, place, and time.  Psychiatric:        Mood and Affect: Mood normal.        Behavior: Behavior normal.   Last CBC Lab Results  Component Value Date   WBC 7.3 11/27/2021   HGB 15.5 11/27/2021   HCT 45.7 11/27/2021   MCV 90 11/27/2021   MCH 30.5 11/27/2021   RDW 12.8 11/27/2021   PLT 342 [id number]   Last metabolic panel Lab Results  Component Value Date   GLUCOSE 164 (H) 11/27/2021   NA 141 11/27/2021   K 3.9 11/27/2021   CL 105 11/27/2021   CO2 19 (L) 11/27/2021   BUN 7 11/27/2021   CREATININE 0.67 11/27/2021   EGFR 104 11/27/2021   CALCIUM 9.9 11/27/2021   PHOS 2.7 09/24/2008   PROT 6.9 11/27/2021   ALBUMIN 4.7 11/27/2021   LABGLOB 2.2 11/27/2021   AGRATIO 2.1 11/27/2021   BILITOT 0.4 11/27/2021   ALKPHOS 101 11/27/2021   AST 13 11/27/2021   ALT 10 11/27/2021   ANIONGAP 10 07/03/2015   Last hemoglobin A1c Lab Results  Component Value Date   HGBA1C 8.5 (H) 11/27/2021   Last thyroid functions Lab Results  Component Value Date   TSH 2.620 11/27/2021   Last vitamin B12 and Folate Lab Results  Component Value Date   [id number]  11/27/2021   FOLATE 8.1 11/27/2021   The 10-year ASCVD risk score (Arnett DK, et al., 2019) is: 1.8%* (Cholesterol units were assumed)    Assessment & Plan:   Problem List Items Addressed This Visit  Adjustment disorder with mixed anxiety and depressed mood    Symptoms have been worse recently in the setting of going through divorce.  She denies SI/HI currently.  She has recently established care with psychiatry, who has recommended increasing Cymbalta to 60 mg daily. -Increase Cymbalta to 60 mg daily.  Follow-up in 1 month for reassessment.      Alcohol use disorder    She is currently drinking 5-6 shots of liquor daily and has been doing so for the past 2 months as she is going through a divorce.  I have expressed concern over her alcohol consumption today. -Start thiamine supplementation, 100 mg daily -I recommended that she establish care at [facility] in [place].  She is in agreement with this plan      Memory loss    She will follow-up with neurology next month for evaluation of memory loss.  Previous MRI brain was unremarkable.  She feels that her memory issues have been worse recently, likely due to increased alcohol consumption.  She is also prescribed Topamax, which could be contributing to memory impairment. -Addressing alcohol use disorder as noted above -Neurology follow-up next month      Return in about 4 weeks (around 04/14/2022).   Johnette Abraham, MD
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
## 2022-03-20 ENCOUNTER — Encounter: Payer: Self-pay | Admitting: Radiology

## 2022-04-03 ENCOUNTER — Encounter: Payer: Self-pay | Admitting: Psychiatry

## 2022-04-03 ENCOUNTER — Ambulatory Visit (INDEPENDENT_AMBULATORY_CARE_PROVIDER_SITE_OTHER): Payer: BC Managed Care – PPO | Admitting: Psychiatry

## 2022-04-03 VITALS — BP 134/76 | HR 81 | Ht 62.0 in | Wt 144.4 lb

## 2022-04-03 DIAGNOSIS — R413 Other amnesia: Secondary | ICD-10-CM

## 2022-04-03 NOTE — Progress Notes (Signed)
   CC:  memory loss  Follow-up Visit  Last visit: 08/28/21  Brief HPI: 52 year old female with a history of alcohol use disorder, panic attacks, hypothyroidism, migraines, HLD, DM who follows in clinic for memory loss.  At her last visit, brain MRI was ordered.  Interval History: She feels her short term memory has been getting worse over the past few months. It is starting to interfere with her work. She is forgetting how to use her computer at work. Will look at the computer and not know what button to push. Forgets conversations she had a few hours ago. She was recently started on thiamine for alcohol use disorder. Per PCP note she is drinking 5-6 shots of liquor daily.  States she had a 3 hour memory test yesterday through Psychology. She is unsure exactly what type of testing this was or what the results were.  MRI brain 09/11/21 was unremarkable. TSH and B12 levels were normal.  Physical Exam:   Vital Signs: BP 134/76 (BP Location: Left Arm, Patient Position: Sitting, Cuff Size: Normal)   Pulse 81   Ht 5\' 2"  (1.575 m)   Wt 144 lb 6.4 oz (65.5 kg)   BMI 26.41 kg/m  GENERAL:  well appearing, in no acute distress, alert  SKIN:  Color, texture, turgor normal. No rashes or lesions HEAD:  Normocephalic/atraumatic. RESP: normal respiratory effort MSK:  No gross joint deformities.   NEUROLOGICAL: Mental Status:     04/03/2022    8:54 AM 08/28/2021    9:16 AM 07/17/2021    9:42 AM  Montreal Cognitive Assessment   Visuospatial/ Executive (0/5) 5 4 5   Naming (0/3) 3 3 3   Attention: Read list of digits (0/2) 2 2 2   Attention: Read list of letters (0/1) 1 1 1   Attention: Serial 7 subtraction starting at 100 (0/3) 2 3 3   Language: Repeat phrase (0/2) 1 2 2   Language : Fluency (0/1) 0 0 0  Abstraction (0/2) 2 2 2   Delayed Recall (0/5) 3 1 4   Orientation (0/6) 6 5 6   Total 25 23 28   Adjusted Score (based on education)   29    Cranial Nerves: PERRL, face symmetric, no  dysarthria, hearing grossly intact Motor: moves all extremities equally Gait: normal-based.  IMPRESSION: 52 year old female with a history of with a history of alcohol use disorder, panic attacks, hypothyroidism, migraines, HLD, DM who presents for follow up of memory loss. Brain MRI was normal. While she feels her memory is subjectively worse, her MOCA score today is slightly improved at 25/30. As of now there is no evidence for a neurodegenerative disorder. Per documentation review there is concern with her PCP and Psychology for alcohol use disorder, which is likely contributing to her memory loss. Discussed importance of limiting alcohol and continuing thiamine to help prevent memory loss. Will have notes from her recent memory testing faxed over the office for review. Again discussed Topamax side effects of memory loss/brain fog, however she is hesitant to adjust this dose as it does help with her migraines.  PLAN: -Counseled on limiting alcohol use  -Agree with thiamine supplementation -Will have notes from recent memory testing in [place] faxed over for review  Follow-up: 7 months  I spent a total of 21 minutes on the date of the service. Discussed medication side effects, adverse reactions and drug interactions. Written educational materials and patient instructions outlining all of the above were given.  Genia Harold, MD 04/03/22 9:24 AM

## 2022-04-14 ENCOUNTER — Ambulatory Visit: Payer: BC Managed Care – PPO | Admitting: Internal Medicine

## 2022-04-21 ENCOUNTER — Other Ambulatory Visit: Payer: Self-pay | Admitting: Internal Medicine

## 2022-04-21 DIAGNOSIS — M25531 Pain in right wrist: Secondary | ICD-10-CM

## 2022-04-28 ENCOUNTER — Ambulatory Visit: Payer: BC Managed Care – PPO | Admitting: Internal Medicine

## 2022-05-12 ENCOUNTER — Encounter: Payer: Self-pay | Admitting: Orthopedic Surgery

## 2022-05-12 ENCOUNTER — Ambulatory Visit (INDEPENDENT_AMBULATORY_CARE_PROVIDER_SITE_OTHER): Payer: BC Managed Care – PPO | Admitting: Orthopedic Surgery

## 2022-05-12 VITALS — BP 133/79 | HR 89 | Ht 62.0 in

## 2022-05-12 DIAGNOSIS — M65331 Trigger finger, right middle finger: Secondary | ICD-10-CM | POA: Diagnosis not present

## 2022-05-12 DIAGNOSIS — M65341 Trigger finger, right ring finger: Secondary | ICD-10-CM

## 2022-05-12 MED ORDER — METHYLPREDNISOLONE ACETATE 40 MG/ML IJ SUSP
40.0000 mg | Freq: Once | INTRAMUSCULAR | Status: AC
  Administered 2022-05-12: 40 mg via INTRA_ARTICULAR

## 2022-05-12 NOTE — Progress Notes (Signed)
   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  Flexor tendons intact neurovascular exam normal recommend injections in both digits  Trigger finger injection  Diagnosis   Encounter Diagnoses  Name Primary?   Trigger finger, right ring finger Yes   Trigger finger, right middle finger     Procedure injection A1 pulley Medications lidocaine 1% 1 mL and Depo-Medrol 40 mg 1 mL Skin prep alcohol and ethyl chloride Verbal consent was obtained Timeout confirmed the injection site  After cleaning the skin with alcohol and anesthetizing the skin with ethyl chloride the A1 pulley was palpated and the injection was performed without complication   Trigger finger injection  Diagnosis   Encounter Diagnoses  Name Primary?   Trigger finger, right ring finger Yes   Trigger finger, right middle finger     Procedure injection A1 pulley Medications lidocaine 1% 1 mL and Depo-Medrol 40 mg 1 mL Skin prep alcohol and ethyl chloride Verbal consent was obtained Timeout confirmed the injection site  After cleaning the skin with alcohol and anesthetizing the skin with ethyl chloride the A1 pulley was palpated and the injection was performed without complication

## 2022-05-24 ENCOUNTER — Other Ambulatory Visit: Payer: Self-pay | Admitting: Internal Medicine

## 2022-05-24 DIAGNOSIS — M25531 Pain in right wrist: Secondary | ICD-10-CM

## 2022-06-27 ENCOUNTER — Encounter: Payer: Self-pay | Admitting: Internal Medicine

## 2022-06-27 ENCOUNTER — Ambulatory Visit (INDEPENDENT_AMBULATORY_CARE_PROVIDER_SITE_OTHER): Payer: BC Managed Care – PPO | Admitting: Internal Medicine

## 2022-06-27 ENCOUNTER — Other Ambulatory Visit: Payer: Self-pay | Admitting: Internal Medicine

## 2022-06-27 VITALS — BP 136/83 | HR 97 | Ht 62.0 in | Wt 144.6 lb

## 2022-06-27 DIAGNOSIS — G43109 Migraine with aura, not intractable, without status migrainosus: Secondary | ICD-10-CM

## 2022-06-27 MED ORDER — TOPIRAMATE 50 MG PO TABS: 50.0000 mg | ORAL_TABLET | Freq: Two times a day (BID) | ORAL | 2 refills | Status: DC

## 2022-06-27 MED ORDER — SUMATRIPTAN SUCCINATE 50 MG PO TABS: 50.0000 mg | ORAL_TABLET | ORAL | 0 refills | Status: DC | PRN

## 2022-06-27 NOTE — Progress Notes (Signed)
Acute Office Visit  Subjective:     Patient ID: [name], female    DOB: 12/31/1970, 52 y.o.   MRN: [id number]  Chief Complaint  Patient presents with   Migraine    Got worse over the last month   [name] presents today for an acute visit for migraine headaches.  She has a history of migraine headaches and is currently prescribed Topamax 25 mg twice daily.  This has previously controlled her migraines quite well, but she states that over the last month migraines have increased in frequency.  There has not been any change in terms of quality or intensity of her headaches.  She has had 4 migraines within the last month.  She endorses associated photophobia and phonophobia.  Review of Systems  Neurological:  Positive for headaches.  All other systems reviewed and are negative.     Objective:    BP 136/83   Pulse 97   Ht 5\' 2"  (1.575 m)   Wt 144 lb 9.6 oz (65.6 kg)   SpO2 99%   BMI 26.45 kg/m   Physical Exam Vitals reviewed.  Constitutional:      General: She is not in acute distress.    Appearance: Normal appearance. She is not toxic-appearing.  HENT:     Head: Normocephalic and atraumatic.     Right Ear: External ear normal.     Left Ear: External ear normal.     Nose: Nose normal. No congestion or rhinorrhea.     Mouth/Throat:     Mouth: Mucous membranes are moist.     Pharynx: Oropharynx is clear. No oropharyngeal exudate or posterior oropharyngeal erythema.  Eyes:     General: No scleral icterus.    Extraocular Movements: Extraocular movements intact.     Conjunctiva/sclera: Conjunctivae normal.     Pupils: Pupils are equal, round, and reactive to light.  Cardiovascular:     Rate and Rhythm: Normal rate and regular rhythm.     Pulses: Normal pulses.     Heart sounds: Normal heart sounds. No murmur heard.    No friction rub. No gallop.  Pulmonary:     Effort: Pulmonary effort is normal.     Breath sounds: Normal breath sounds. No wheezing,  rhonchi or rales.  Abdominal:     General: Abdomen is flat. Bowel sounds are normal. There is no distension.     Palpations: Abdomen is soft.     Tenderness: There is no abdominal tenderness.  Musculoskeletal:        General: No swelling. Normal range of motion.     Cervical back: Normal range of motion.     Right lower leg: No edema.     Left lower leg: No edema.  Lymphadenopathy:     Cervical: No cervical adenopathy.  Skin:    General: Skin is warm and dry.     Capillary Refill: Capillary refill takes less than 2 seconds.     Coloration: Skin is not jaundiced.  Neurological:     General: No focal deficit present.     Mental Status: She is alert and oriented to person, place, and time.  Psychiatric:        Mood and Affect: Mood normal.        Behavior: Behavior normal.      Assessment & Plan:   Problem List Items Addressed This Visit       Migraines - Primary    Presenting today for an acute visit for evaluation  of migraine headaches.  She has a history of migraine headaches, previously well-controlled with Topamax 25 mg twice daily.  Headache frequency has increased over the last month.  She endorses associated phonophobia and photophobia. -Increase Topamax to 50 mg twice daily -Add Imitrex 50 mg as needed for abortive therapy -We will tentatively plan for follow-up in 1 month for reassessment and routine care      Meds ordered this encounter  Medications   topiramate (TOPAMAX) 50 MG tablet    Sig: Take 1 tablet (50 mg total) by mouth 2 (two) times daily.    Dispense:  60 tablet    Refill:  2   SUMAtriptan (IMITREX) 50 MG tablet    Sig: Take 1 tablet (50 mg total) by mouth every 2 (two) hours as needed for migraine. May repeat in 2 hours if headache persists or recurs.    Dispense:  10 tablet    Refill:  0    Return in about 1 month (around 07/27/2022).  [name], MD

## 2022-06-27 NOTE — Patient Instructions (Signed)
It was a pleasure to see you today.  Thank you for giving [place] the opportunity to be involved in your care.  Below is a brief recap of your visit and next steps.  We will plan to see you again in 1 month.  Summary Increase Imitrex to 50 mg twice daily Add Imitrex for as needed migraine relief Follow up in 1 month

## 2022-06-27 NOTE — Assessment & Plan Note (Signed)
Presenting today for an acute visit for evaluation of migraine headaches.  She has a history of migraine headaches, previously well-controlled with Topamax 25 mg twice daily.  Headache frequency has increased over the last month.  She endorses associated phonophobia and photophobia. -Increase Topamax to 50 mg twice daily -Add Imitrex 50 mg as needed for abortive therapy -We will tentatively plan for follow-up in 1 month for reassessment and routine care

## 2022-06-28 ENCOUNTER — Observation Stay (HOSPITAL_COMMUNITY): Payer: BC Managed Care – PPO

## 2022-06-28 ENCOUNTER — Encounter (HOSPITAL_COMMUNITY): Payer: Self-pay | Admitting: *Deleted

## 2022-06-28 ENCOUNTER — Other Ambulatory Visit: Payer: Self-pay

## 2022-06-28 ENCOUNTER — Observation Stay (HOSPITAL_COMMUNITY)
Admission: EM | Admit: 2022-06-28 | Discharge: 2022-06-29 | Disposition: A | Discharge status: Home / Self Care | Payer: BC Managed Care – PPO | Attending: Internal Medicine | Admitting: Internal Medicine

## 2022-06-28 ENCOUNTER — Emergency Department (HOSPITAL_COMMUNITY): Payer: BC Managed Care – PPO

## 2022-06-28 DIAGNOSIS — E039 Hypothyroidism, unspecified: Secondary | ICD-10-CM | POA: Diagnosis not present

## 2022-06-28 DIAGNOSIS — Z794 Long term (current) use of insulin: Secondary | ICD-10-CM | POA: Insufficient documentation

## 2022-06-28 DIAGNOSIS — Z79899 Other long term (current) drug therapy: Secondary | ICD-10-CM | POA: Diagnosis not present

## 2022-06-28 DIAGNOSIS — R531 Weakness: Principal | ICD-10-CM

## 2022-06-28 DIAGNOSIS — F4323 Adjustment disorder with mixed anxiety and depressed mood: Secondary | ICD-10-CM | POA: Diagnosis not present

## 2022-06-28 DIAGNOSIS — F1090 Alcohol use, unspecified, uncomplicated: Secondary | ICD-10-CM | POA: Diagnosis present

## 2022-06-28 DIAGNOSIS — R42 Dizziness and giddiness: Secondary | ICD-10-CM | POA: Diagnosis not present

## 2022-06-28 DIAGNOSIS — R410 Disorientation, unspecified: Secondary | ICD-10-CM | POA: Diagnosis not present

## 2022-06-28 DIAGNOSIS — E1065 Type 1 diabetes mellitus with hyperglycemia: Secondary | ICD-10-CM

## 2022-06-28 DIAGNOSIS — G43809 Other migraine, not intractable, without status migrainosus: Secondary | ICD-10-CM

## 2022-06-28 DIAGNOSIS — M797 Fibromyalgia: Secondary | ICD-10-CM | POA: Diagnosis present

## 2022-06-28 DIAGNOSIS — R29818 Other symptoms and signs involving the nervous system: Secondary | ICD-10-CM | POA: Diagnosis not present

## 2022-06-28 LAB — COMPREHENSIVE METABOLIC PANEL
ALT: 17 U/L (ref 0–44)
AST: 16 U/L (ref 15–41)
Albumin: 3.4 g/dL — ABNORMAL LOW (ref 3.5–5.0)
Alkaline Phosphatase: 97 U/L (ref 38–126)
Anion gap: 7 (ref 5–15)
BUN: 11 mg/dL (ref 6–20)
CO2: 22 mmol/L (ref 22–32)
Calcium: 8.5 mg/dL — ABNORMAL LOW (ref 8.9–10.3)
Chloride: 103 mmol/L (ref 98–111)
Creatinine, Ser: 0.78 mg/dL (ref 0.44–1.00)
GFR, Estimated: >60 mL/min (ref >60)
Glucose, Bld: 221 mg/dL — ABNORMAL HIGH (ref 70–99)
Potassium: 4.2 mmol/L (ref 3.5–5.1)
Sodium: 132 mmol/L — ABNORMAL LOW (ref 135–145)
Total Bilirubin: 0.8 mg/dL (ref 0.3–1.2)
Total Protein: 6.1 g/dL — ABNORMAL LOW (ref 6.5–8.1)

## 2022-06-28 LAB — PROTIME-INR
INR: 1 (ref 0.8–1.2)
Prothrombin Time: 13.1 seconds (ref 11.4–15.2)

## 2022-06-28 LAB — RAPID URINE DRUG SCREEN, HOSP PERFORMED
Amphetamines: NOT DETECTED
Barbiturates: NOT DETECTED
Benzodiazepines: NOT DETECTED
Cocaine: NOT DETECTED
Opiates: NOT DETECTED
Tetrahydrocannabinol: POSITIVE — AB

## 2022-06-28 LAB — GLUCOSE, CAPILLARY: Glucose-Capillary: 293 mg/dL — ABNORMAL HIGH (ref 70–99)

## 2022-06-28 LAB — APTT: aPTT: 25 seconds (ref 24–36)

## 2022-06-28 LAB — HEMOGLOBIN A1C
Hgb A1c MFr Bld: 9.1 % — ABNORMAL HIGH (ref 4.8–5.6)
Mean Plasma Glucose: 214.47 mg/dL

## 2022-06-28 LAB — CBC WITH DIFFERENTIAL/PLATELET
Abs Immature Granulocytes: 0.02 10*3/uL (ref 0.00–0.07)
Basophils Absolute: 0.1 10*3/uL (ref 0.0–0.1)
Basophils Relative: 1 %
Eosinophils Absolute: 0.2 10*3/uL (ref 0.0–0.5)
Eosinophils Relative: 3 %
HCT: 39.9 % (ref 36.0–46.0)
Hemoglobin: 13.5 g/dL (ref 12.0–15.0)
Immature Granulocytes: 0 %
Lymphocytes Relative: 29 %
Lymphs Abs: 2 10*3/uL (ref 0.7–4.0)
MCH: 31 pg (ref 26.0–34.0)
MCHC: 33.8 g/dL (ref 30.0–36.0)
MCV: 91.5 fL (ref 80.0–100.0)
Monocytes Absolute: 0.6 10*3/uL (ref 0.1–1.0)
Monocytes Relative: 8 %
Neutro Abs: 4.2 10*3/uL (ref 1.7–7.7)
Neutrophils Relative %: 59 %
Platelets: 155 10*3/uL (ref 150–400)
RBC: 4.36 MIL/uL (ref 3.87–5.11)
RDW: 12.8 % (ref 11.5–15.5)
WBC: 7 10*3/uL (ref 4.0–10.5)
nRBC: 0 % (ref 0.0–0.2)

## 2022-06-28 LAB — URINALYSIS, ROUTINE W REFLEX MICROSCOPIC
Bilirubin Urine: NEGATIVE
Glucose, UA: 50 mg/dL — AB
Hgb urine dipstick: NEGATIVE
Ketones, ur: NEGATIVE mg/dL
Leukocytes,Ua: NEGATIVE
Nitrite: NEGATIVE
Protein, ur: NEGATIVE mg/dL
Specific Gravity, Urine: 1.003 — ABNORMAL LOW (ref 1.005–1.030)
pH: 7 (ref 5.0–8.0)

## 2022-06-28 LAB — HCG, QUANTITATIVE, PREGNANCY: hCG, Beta Chain, Quant, S: 8 m[IU]/mL — ABNORMAL HIGH (ref <5)

## 2022-06-28 LAB — CBG MONITORING, ED: Glucose-Capillary: 230 mg/dL — ABNORMAL HIGH (ref 70–99)

## 2022-06-28 LAB — ETHANOL: Alcohol, Ethyl (B): <10 mg/dL (ref <10)

## 2022-06-28 MED ORDER — ACETAMINOPHEN 650 MG RE SUPP: 650.0000 mg | RECTAL | Status: DC | PRN

## 2022-06-28 MED ORDER — ENOXAPARIN SODIUM 40 MG/0.4ML IJ SOSY: 40.0000 mg | PREFILLED_SYRINGE | INTRAMUSCULAR | Status: DC

## 2022-06-28 MED ORDER — INSULIN ASPART 100 UNIT/ML IJ SOLN
0.0000 [IU] | INTRAMUSCULAR | Status: DC
  Administered 2022-06-29: 5 [IU] via SUBCUTANEOUS
  Administered 2022-06-29: 3 [IU] via SUBCUTANEOUS
  Administered 2022-06-29: 7 [IU] via SUBCUTANEOUS

## 2022-06-28 MED ORDER — TOPIRAMATE 25 MG PO TABS
50.0000 mg | ORAL_TABLET | Freq: Two times a day (BID) | ORAL | Status: DC
  Administered 2022-06-28 – 2022-06-29 (×2): 50 mg via ORAL
  Filled 2022-06-28 (×2): qty 2

## 2022-06-28 MED ORDER — IOHEXOL 350 MG/ML SOLN
75.0000 mL | Freq: Once | INTRAVENOUS | Status: AC | PRN
  Administered 2022-06-28: 75 mL via INTRAVENOUS

## 2022-06-28 MED ORDER — SUMATRIPTAN SUCCINATE 50 MG PO TABS: 50.0000 mg | ORAL_TABLET | ORAL | Status: DC | PRN

## 2022-06-28 MED ORDER — FOLIC ACID 1 MG PO TABS
1.0000 mg | ORAL_TABLET | Freq: Every day | ORAL | Status: DC
  Administered 2022-06-28 – 2022-06-29 (×2): 1 mg via ORAL
  Filled 2022-06-28 (×2): qty 1

## 2022-06-28 MED ORDER — DULOXETINE HCL 60 MG PO CPEP
60.0000 mg | ORAL_CAPSULE | Freq: Every day | ORAL | Status: DC
  Administered 2022-06-28 – 2022-06-29 (×2): 60 mg via ORAL
  Filled 2022-06-28: qty 1
  Filled 2022-06-28: qty 2

## 2022-06-28 MED ORDER — SODIUM CHLORIDE 0.9 % IV SOLN: INTRAVENOUS | Status: DC

## 2022-06-28 MED ORDER — ACETAMINOPHEN 325 MG PO TABS: 650.0000 mg | ORAL_TABLET | ORAL | Status: DC | PRN

## 2022-06-28 MED ORDER — ASPIRIN 325 MG PO TABS
325.0000 mg | ORAL_TABLET | Freq: Once | ORAL | Status: AC
  Administered 2022-06-28: 325 mg via ORAL
  Filled 2022-06-28: qty 1

## 2022-06-28 MED ORDER — ROSUVASTATIN CALCIUM 5 MG PO TABS: 5.0000 mg | ORAL_TABLET | ORAL | Status: DC

## 2022-06-28 MED ORDER — ASPIRIN 81 MG PO TBEC
81.0000 mg | DELAYED_RELEASE_TABLET | Freq: Every day | ORAL | Status: DC
  Administered 2022-06-29: 81 mg via ORAL
  Filled 2022-06-28: qty 1

## 2022-06-28 MED ORDER — THIAMINE MONONITRATE 100 MG PO TABS
100.0000 mg | ORAL_TABLET | Freq: Every day | ORAL | Status: DC
  Administered 2022-06-28 – 2022-06-29 (×2): 100 mg via ORAL
  Filled 2022-06-28 (×2): qty 1

## 2022-06-28 MED ORDER — SODIUM CHLORIDE 0.9% FLUSH
3.0000 mL | Freq: Once | INTRAVENOUS | Status: AC
  Administered 2022-06-28: 3 mL via INTRAVENOUS

## 2022-06-28 MED ORDER — SENNOSIDES-DOCUSATE SODIUM 8.6-50 MG PO TABS: 1.0000 | ORAL_TABLET | Freq: Every evening | ORAL | Status: DC | PRN

## 2022-06-28 MED ORDER — STROKE: EARLY STAGES OF RECOVERY BOOK
Freq: Once | Status: AC
  Filled 2022-06-28 (×2): qty 1

## 2022-06-28 MED ORDER — ACETAMINOPHEN 160 MG/5ML PO SOLN: 650.0000 mg | ORAL | Status: DC | PRN

## 2022-06-28 MED ORDER — LEVOTHYROXINE SODIUM 75 MCG PO TABS
75.0000 ug | ORAL_TABLET | Freq: Every day | ORAL | Status: DC
  Administered 2022-06-28 – 2022-06-29 (×2): 75 ug via ORAL
  Filled 2022-06-28: qty 1
  Filled 2022-06-28: qty 2

## 2022-06-28 MED ORDER — VITAMIN D 25 MCG (1000 UNIT) PO TABS
1000.0000 [IU] | ORAL_TABLET | Freq: Every day | ORAL | Status: DC
  Administered 2022-06-28 – 2022-06-29 (×2): 1000 [IU] via ORAL
  Filled 2022-06-28 (×2): qty 1

## 2022-06-28 NOTE — H&P (Addendum)
TRH H&P   Patient Demographics:    [name], is a 52 y.o. female  MRN: [id number]   DOB - 09-13-1970  Admit Date - 06/28/2022  Outpatient Primary MD for the patient is [name], MD  Referring MD/NP/PA: Liliane Channel    Patient coming from: home  Chief Complaint  Patient presents with   Dizziness      HPI:    Amore Ackman  is a 52 y.o. female, with past medical history of alcohol use disorder, fibromyalgia, panic attacks, hypothyroidism, type 1 diabetes mellitus, on insulin pump, migraines, hyperlipidemia. -Patient presents to ED as code stroke, daughter at bedside present at exam and evaluation, patient with history of migraine headaches, where her Topamax was recently increased to she is 25 mg p.o. twice daily, she has been increased to 50 mg p.o. twice daily, today Safir states she is taking the increased dose, woke up with headache this morning, no focal deficits, he drove to the pharmacy to pick up her thyroid medicine, she noted some confusion, was concerned about hallucination, she drove back and had to pull over complaining of confusion, not feeling well, she does report left-sided heaviness, tingling and numbness, she was feeling about to pass out, she called daughter who called EMS, blood pressure was 161/97, glucose 230 at the scene.  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    Review of systems:      A full 10 point Review of Systems was done, except as stated  above, all other Review of Systems were negative.   With Past History of the following :    Past Medical History:  Diagnosis Date   Diabetes (HCC)    Fibromyalgia    High cholesterol    Thyroid disease       Past Surgical History:  Procedure Laterality Date   caesarean        Social History:     Social History   Tobacco Use   Smoking status: Never   Smokeless tobacco: Never  Substance Use Topics   Alcohol use: Yes    Comment: 1 shot of liquor in the morning and 2 at night daily since to send first 2023.  In 20s heavy use of liquor after her first husband's death     -      Family History :     Family History  Problem Relation Age of Onset   Cancer Paternal Grandfather    Cancer Paternal Grandmother    Cancer Maternal  Grandmother    Cancer Maternal Grandfather    Heart attack Father    Thyroid disease Sister    Fibromyalgia Sister    Diabetes Other       Home Medications:   Prior to Admission medications   Medication Sig Start Date End Date Taking? Authorizing Provider  cholecalciferol (VITAMIN D3) 25 MCG (1000 UNIT) tablet Take 1,000 Units by mouth daily.   Yes [provider]  diclofenac (VOLTAREN) 75 MG EC tablet TAKE (1) TABLET BY MOUTH TWICE DAILY. Patient taking differently: 75 mg. 05/26/22  Yes [name], MD  DULoxetine (CYMBALTA) 60 MG capsule Take 1 capsule (60 mg total) by mouth daily. 03/17/22 06/28/22 Yes [name], MD  insulin aspart (NOVOLOG) 100 UNIT/ML injection by Pump Prime route continuous.   Yes [provider]  levothyroxine (SYNTHROID) 75 MCG tablet Take 75 mcg by mouth daily.   Yes [provider]  rosuvastatin (CRESTOR) 5 MG tablet Take 5 mg by mouth every Monday, Wednesday, and Friday. 12/12/19  Yes [provider]  SUMAtriptan (IMITREX) 50 MG tablet Take 1 tablet (50 mg total) by mouth every 2 (two) hours as needed for migraine. May repeat in 2 hours if headache persists or recurs.  06/27/22  Yes [name], MD  topiramate (TOPAMAX) 50 MG tablet Take 1 tablet (50 mg total) by mouth 2 (two) times daily. 06/27/22 09/25/22 Yes [name], MD  Vitamin D, Ergocalciferol, (DRISDOL) 1.25 MG (50000 UNIT) CAPS capsule Take 50,000 Units by mouth every Wednesday. 12/12/19  Yes [provider]  simvastatin (ZOCOR) 10 MG tablet Take 10 mg by mouth daily. Dr. Talmage Nap  01/16/19  [provider]     Allergies:    No Known Allergies   Physical Exam:   Vitals  Blood pressure (!) 145/82, pulse 91, temperature 98.7 F (37.1 C), temperature source Oral, resp. rate 19, height 5\' 2"  (1.575 m), weight 63.5 kg, SpO2 100 %.   1. General developed female, laying in bed, no apparent distress  2. Normal affect and insight, Not Suicidal or Homicidal, Awake Alert, Oriented X 3.  3. No F.N deficits, ALL C.Nerves Intact, physical exam is inconsistent, both bilateral upper and lower extremities, with variant degree of deficits .  Overall movement appears to be slow, but no significant weakness noted.  4. Ears and Eyes appear Normal, Conjunctivae clear, PERRLA. Moist Oral Mucosa.  5. Supple Neck, No JVD, No cervical lymphadenopathy appriciated, No Carotid Bruits.  6. Symmetrical Chest wall movement, Good air movement bilaterally, CTAB.  7. RRR, No Gallops, Rubs or Murmurs, No Parasternal Heave.  8. Positive Bowel Sounds, Abdomen Soft, No tenderness, No organomegaly appriciated,No rebound -guarding or rigidity.  9.  No Cyanosis, Normal Skin Turgor, No Skin Rash or Bruise.  10. Good muscle tone,  joints appear normal , no effusions, Normal ROM.    Data Review:    CBC Recent Labs  Lab 06/28/22 1502  WBC 7.0  HGB 13.5  HCT 39.9  PLT 155  MCV 91.5  MCH 31.0  MCHC 33.8  RDW 12.8  LYMPHSABS 2.0  MONOABS 0.6  EOSABS 0.2  BASOSABS 0.1    ------------------------------------------------------------------------------------------------------------------  Chemistries  Recent Labs  Lab 06/28/22 1502  NA 132*  K 4.2  CL 103  CO2 22  GLUCOSE 221*  BUN 11  CREATININE 0.78  CALCIUM 8.5*  AST 16  ALT 17  ALKPHOS 97  BILITOT 0.8   ------------------------------------------------------------------------------------------------------------------ estimated creatinine clearance is 70.4 mL/min (  by C-G formula based on SCr of 0.78 mg/dL). ------------------------------------------------------------------------------------------------------------------ No results for input(s): "TSH", "T4TOTAL", "T3FREE", "THYROIDAB" in the last 72 hours.  Invalid input(s): "FREET3"  Coagulation profile Recent Labs  Lab 06/28/22 1604  INR 1.0   ------------------------------------------------------------------------------------------------------------------- No results for input(s): "DDIMER" in the last 72 hours. -------------------------------------------------------------------------------------------------------------------  Cardiac Enzymes No results for input(s): "CKMB", "TROPONINI", "MYOGLOBIN" in the last 168 hours.  Invalid input(s): "CK" ------------------------------------------------------------------------------------------------------------------ No results found for: "BNP"   ---------------------------------------------------------------------------------------------------------------  Urinalysis    Component Value Date/Time   COLORURINE STRAW (A) 06/28/2022 1710   APPEARANCEUR CLEAR 06/28/2022 1710   LABSPEC 1.003 (L) 06/28/2022 1710   PHURINE 7.0 06/28/2022 1710   GLUCOSEU 50 (A) 06/28/2022 1710   HGBUR NEGATIVE 06/28/2022 1710   BILIRUBINUR NEGATIVE 06/28/2022 1710   KETONESUR NEGATIVE 06/28/2022 1710   PROTEINUR NEGATIVE 06/28/2022 1710   UROBILINOGEN 0.2 10/16/2008 0241   NITRITE NEGATIVE 06/28/2022 1710    LEUKOCYTESUR NEGATIVE 06/28/2022 1710    ----------------------------------------------------------------------------------------------------------------   Imaging Results:    CT HEAD CODE STROKE WO CONTRAST`  Result Date: 06/28/2022 CLINICAL DATA:  Code stroke. Confusion, weakness, slurred speech, dizziness. EXAM: CT HEAD WITHOUT CONTRAST TECHNIQUE: Contiguous axial images were obtained from the base of the skull through the vertex without intravenous contrast. RADIATION DOSE REDUCTION: This exam was performed according to the departmental dose-optimization program which includes automated exposure control, adjustment of the mA and/or kV according to patient size and/or use of iterative reconstruction technique. COMPARISON:  Brain MRI 09/11/2021 FINDINGS: Brain: There is no acute intracranial hemorrhage, extra-axial fluid collection, or acute infarct. Parenchymal volume is normal. The ventricles are normal in size. Gray-white differentiation is preserved. There is asymmetric mineralization in the thalami, right more than left. This is unchanged compared to the brain MRI from 2023. The pituitary and suprasellar region are normal. There is no mass lesion. There is no mass effect or midline shift. Vascular: No hyperdense vessel or unexpected calcification. Skull: Normal. Negative for fracture or focal lesion. Sinuses/Orbits: The imaged paranasal sinuses are clear. The mastoid air cells and middle ear cavities are clear. A right lens implant is noted. The globes and orbits are otherwise unremarkable. Other: None. ASPECTS [facility] Stroke Program Early CT Score) - Ganglionic level infarction (caudate, lentiform nuclei, internal capsule, insula, M1-M3 cortex): 7 - Supraganglionic infarction (M4-M6 cortex): 3 Total score (0-10 with 10 being normal): 10 IMPRESSION: No acute intracranial pathology. These results were called by telephone at the time of interpretation on 06/28/2022 at 4:26 pm to provider Dr Estell Harpin,  who verbally acknowledged these results. Electronically Signed   By: Lesia Hausen M.D.   On: 06/28/2022 16:30    EKG:  Vent. rate 93 BPM PR interval 151 ms QRS duration 78 ms QT/QTcB 351/437 ms P-R-T axes 67 19 60 Sinus rhythm Anterior infarct, old   Assessment & Plan:    Principal Problem:   Left-sided weakness Active Problems:   Fibromyalgia affecting multiple sites   Adjustment disorder with mixed anxiety and depressed mood   Hyperglycemia due to type 1 diabetes mellitus (HCC)   Alcohol use disorder   Focal neurological symptoms, including left-sided heaviness and numbness, confusion -Differential diagnoses include complicated migraine, versus side effect of newly increased Topamax dosing, as well possibly stress related from MULTIPLE stressful events in life, as well TIA/CVA need to be ruled out as discussed with the neurology -Will be admitted under TIA/CVA pathway, will proceed with MRI brain (she is currently agreeable to go to [facility] for [facility]  after discussed with patient and her daughter), will obtain CTA head and neck, 2D echo, fasting lipid, A1c, and UDS. -Allow for permissive hypertension -PT/OT/SLP -Received full dose aspirin in ED, continue with aspirin 81 mg oral daily -Continue to treat symptomatic headache with Tylenol and Fioricet  History of migraine -Continue with home dose Topamax, and as needed sumatriptan, will add as needed Fioricet as well  Hypothyroidism -Will check Synthroid  Diabetes mellitus, type I -She is on insulin pump, she has enough supply for next 3 days, she will continue with her insulin pump -Check A1c  Hyperlipidemia -Continue with current dose Crestor, check lipid panel  Fibromyalgia Adjustment disorder with mixed anxiety and depressed mood -Continue with home dose Cymbalta  Alcohol use disorder -will monitor closely for withdrawals, will keep on thiamine and folic acid   DVT Prophylaxis   Lovenox   AM Labs Ordered, also  please review Full Orders  Family Communication: Admission, patients condition and plan of care including tests being ordered have been discussed with the patient, and daughter at bedside, who indicate understanding and agree with the plan and Code Status.  Code Status fULL  Likely DC to  HOME  Condition GUARDED    Consults called: TELE NEUROLOGY    Admission status: OBSERVATION    Time spent in minutes : 40 MINUTES   Huey Bienenstock M.D on 06/28/2022 at 5:39 PM   [facility] - Office  [phone]

## 2022-06-28 NOTE — ED Notes (Signed)
Attempted to call report

## 2022-06-28 NOTE — Progress Notes (Signed)
Telestroke Note    1621: Code stroke cart activated at this time. EDP already assessed patient and patient has already left and returned from CT at time of cart activation. Patient reports waking up normal this morning besides a migraine and some right eye pain that started yesterday. Per patient, migraine has resolved but right eye pain remains. Per patient's daughter, patient has been texting her back and forth and had normal conversation. Patient has been taking Topamax for migraines and just took the first increase in dose this morning at 0900. Patient went to the Pharmacy to pick up a medication at 1400 where she reportedly began to hallucinate. Patient began to drive back home and had sudden onset confusion, dizziness, left arm weakness, left arm pain, and left arm tingling. Patient reports decreased sensation on the left side. Patient pulled over and called her sister and then proceeded to drive to her daughters place of employment. Per patient LKW 1400. mRS 0.   1622: TSP paged at this time.   1625: Dr. Roda Shutters on camera at this time. Patient history and report provided to Dr. Roda Shutters on camera at this time.   1627: Dr. Roda Shutters performing neuro assessment at this time.   1654: Burgess Amor PA at patient's bedside at this time. Dr. Roda Shutters speaking to Burgess Amor PA at this time and providing recommendations on camera.

## 2022-06-28 NOTE — ED Notes (Signed)
Dr. Hyacinth Meeker made aware and asked to see pt, Dr. Hyacinth Meeker stated that he would see pt

## 2022-06-28 NOTE — ED Notes (Signed)
Mild drift to left arm, decrease sensation to left arm and left leg.

## 2022-06-28 NOTE — ED Provider Notes (Signed)
[place] EMERGENCY DEPARTMENT AT [facility] Provider Note   CSN: [id number] Arrival date & time: 06/28/22  1431  An emergency department physician performed an initial assessment on this suspected stroke patient at 1609.  History  Chief Complaint  Patient presents with   Dizziness    [name] is a 52 y.o. female with a history including type 1 diabetes, fibromyalgia, thyroid disease, history of migraine headaches presenting for evaluation of dizziness which started suddenly when she was at the drive-through at a local pharmacy.  She states that she has had a migraine for days, her primary provider increased her Topamax from 25 to 50 mg which she took her first increased dose this morning around 9 AM.  Around 2 PM she started feeling dizzy, lightheaded, not described as vertiginous, also reports numbness in her left arm and leg.  She also had an episode of confusion, stating the driver in front of her look like a 52 year old bald headed man, when she pulled up to the window and commented on it the cashier stated no that was a teenage person in the vehicle of head of her.  She denies any other episodes of confusion between then and now.  She is awake and oriented.  Daughter is at the bedside.  The history is provided by the patient.       Home Medications Prior to Admission medications   Medication Sig Start Date End Date Taking? Authorizing Provider  cholecalciferol (VITAMIN D3) 25 MCG (1000 UNIT) tablet Take 1,000 Units by mouth daily.   Yes [provider]  diclofenac (VOLTAREN) 75 MG EC tablet TAKE (1) TABLET BY MOUTH TWICE DAILY. Patient taking differently: 75 mg. 05/26/22  Yes [name], MD  DULoxetine (CYMBALTA) 60 MG capsule Take 1 capsule (60 mg total) by mouth daily. 03/17/22 06/28/22 Yes [name], MD  insulin aspart (NOVOLOG) 100 UNIT/ML injection by Pump Prime route continuous.   Yes [provider]  levothyroxine (SYNTHROID)  75 MCG tablet Take 75 mcg by mouth daily.   Yes [provider]  rosuvastatin (CRESTOR) 5 MG tablet Take 5 mg by mouth every Monday, Wednesday, and Friday. 12/12/19  Yes [provider]  SUMAtriptan (IMITREX) 50 MG tablet Take 1 tablet (50 mg total) by mouth every 2 (two) hours as needed for migraine. May repeat in 2 hours if headache persists or recurs. 06/27/22  Yes [name], MD  topiramate (TOPAMAX) 50 MG tablet Take 1 tablet (50 mg total) by mouth 2 (two) times daily. 06/27/22 09/25/22 Yes [name], MD  Vitamin D, Ergocalciferol, (DRISDOL) 1.25 MG (50000 UNIT) CAPS capsule Take 50,000 Units by mouth every Wednesday. 12/12/19  Yes [provider]  simvastatin (ZOCOR) 10 MG tablet Take 10 mg by mouth daily. Dr. Talmage Nap  01/16/19  [provider]      Allergies    Patient has no known allergies.    Review of Systems   Review of Systems  Constitutional:  Negative for chills and fever.  HENT:  Negative for congestion and sore throat.   Eyes: Negative.   Respiratory:  Negative for chest tightness and shortness of breath.   Cardiovascular:  Negative for chest pain.  Gastrointestinal:  Negative for abdominal pain, nausea and vomiting.  Genitourinary: Negative.   Musculoskeletal:  Negative for arthralgias, joint swelling and neck pain.  Skin: Negative.  Negative for rash and wound.  Neurological:  Positive for dizziness, weakness and numbness. Negative for light-headedness  and headaches.  Psychiatric/Behavioral:  Positive for confusion.   All other systems reviewed and are negative.   Physical Exam Updated Vital Signs BP (!) 145/82   Pulse 91   Temp 98.7 F (37.1 C) (Oral)   Resp 19   Ht 5\' 2"  (1.575 m)   Wt 63.5 kg   SpO2 100%   BMI 25.61 kg/m  Physical Exam Vitals and nursing note reviewed.  Constitutional:      Appearance: She is well-developed.  HENT:     Head: Normocephalic and atraumatic.  Eyes:     General: No visual field  deficit.    Conjunctiva/sclera: Conjunctivae normal.  Cardiovascular:     Rate and Rhythm: Normal rate and regular rhythm.     Heart sounds: Normal heart sounds.  Pulmonary:     Effort: Pulmonary effort is normal.     Breath sounds: Normal breath sounds. No wheezing.  Abdominal:     General: Bowel sounds are normal.     Palpations: Abdomen is soft.     Tenderness: There is no abdominal tenderness.  Musculoskeletal:        General: Normal range of motion.     Cervical back: Normal range of motion.  Skin:    General: Skin is warm and dry.  Neurological:     Mental Status: She is alert and oriented to person, place, and time.     Cranial Nerves: Cranial nerves 2-12 are intact. No dysarthria.     Sensory: Sensory deficit present.     Coordination: Finger-Nose-Finger Test normal.     Comments: Decreased sensation fine touch left upper and lower extremities, stocking distribution.  4/5 grip left,  5/5 right. No foot drop.  Pt can flex/ext and hip and knee, slower with left movement. No pronator drift.      ED Results / Procedures / Treatments   Labs (all labs ordered are listed, but only abnormal results are displayed) Labs Reviewed  COMPREHENSIVE METABOLIC PANEL - Abnormal; Notable for the following components:      Result Value   Sodium 132 (*)    Glucose, Bld 221 (*)    Calcium 8.5 (*)    Total Protein 6.1 (*)    Albumin 3.4 (*)    All other components within normal limits  HCG, QUANTITATIVE, PREGNANCY - Abnormal; Notable for the following components:   hCG, Beta Chain, Quant, S 8 (*)    All other components within normal limits  CBG MONITORING, ED - Abnormal; Notable for the following components:   Glucose-Capillary 230 (*)    All other components within normal limits  CBC WITH DIFFERENTIAL/PLATELET  PROTIME-INR  APTT  ETHANOL  URINALYSIS, ROUTINE W REFLEX MICROSCOPIC  I-STAT CHEM 8, ED    EKG EKG Interpretation  Date/Time:  Saturday June 28 2022 14:41:02  EDT Ventricular Rate:  93 PR Interval:  151 QRS Duration: 78 QT Interval:  351 QTC Calculation: 437 R Axis:   19 Text Interpretation: Sinus rhythm Anterior infarct, old Confirmed by Eber Hong ([id number]) on 06/28/2022 2:43:31 PM  Radiology CT HEAD CODE STROKE WO CONTRAST`  Result Date: 06/28/2022 CLINICAL DATA:  Code stroke. Confusion, weakness, slurred speech, dizziness. EXAM: CT HEAD WITHOUT CONTRAST TECHNIQUE: Contiguous axial images were obtained from the base of the skull through the vertex without intravenous contrast. RADIATION DOSE REDUCTION: This exam was performed according to the departmental dose-optimization program which includes automated exposure control, adjustment of the mA and/or kV according to patient size and/or use of iterative  reconstruction technique. COMPARISON:  Brain MRI 09/11/2021 FINDINGS: Brain: There is no acute intracranial hemorrhage, extra-axial fluid collection, or acute infarct. Parenchymal volume is normal. The ventricles are normal in size. Gray-white differentiation is preserved. There is asymmetric mineralization in the thalami, right more than left. This is unchanged compared to the brain MRI from 2023. The pituitary and suprasellar region are normal. There is no mass lesion. There is no mass effect or midline shift. Vascular: No hyperdense vessel or unexpected calcification. Skull: Normal. Negative for fracture or focal lesion. Sinuses/Orbits: The imaged paranasal sinuses are clear. The mastoid air cells and middle ear cavities are clear. A right lens implant is noted. The globes and orbits are otherwise unremarkable. Other: None. ASPECTS [facility] Stroke Program Early CT Score) - Ganglionic level infarction (caudate, lentiform nuclei, internal capsule, insula, M1-M3 cortex): 7 - Supraganglionic infarction (M4-M6 cortex): 3 Total score (0-10 with 10 being normal): 10 IMPRESSION: No acute intracranial pathology. These results were called by telephone at the time of  interpretation on 06/28/2022 at 4:26 pm to provider Dr Estell Harpin, who verbally acknowledged these results. Electronically Signed   By: Lesia Hausen M.D.   On: 06/28/2022 16:30    Procedures Procedures    Medications Ordered in ED Medications  aspirin tablet 325 mg (has no administration in time range)  sodium chloride flush (NS) 0.9 % injection 3 mL (3 mLs Intravenous Given 06/28/22 1631)    ED Course/ Medical Decision Making/ A&P                             Medical Decision Making Code stroke initially called given left sided weakness, no complete neglect and can move all 4 extremities, but with greater effort left upper and lower.  Could also be complicated migraine or side effect of her increased topamax which she started today.    Amount and/or Complexity of Data Reviewed Labs: ordered.    Details: Labs reviewed, she has a mild hyponatremia with a sodium of 132, her glucose is 221, there is no anion gap she has a CO2 of 22, CBC is normal with a WBC count of 7.0 and hemoglobin of 13.5 coags are normal, Radiology: ordered.    Details: CT head is negative for acute infarction or hemorrhage. ECG/medicine tests: ordered. Discussion of management or test interpretation with external provider(s): Dr. Roda Shutters with neurology evaluated pt who suspected this could indeed be complicated migraine or topamax side effect, but she will need full cva workup including mri, echo.  He is aware we have no mri capability here.  He states pt can stay here and get mri Monday am.  Would give ASA 325 now,  then 81 mg daily starting tomorrow.   Call placed to hospitalist service for admission.   Discussed with Dr. Randol Kern who accepts pt for admission.  Risk Decision regarding hospitalization.           Final Clinical Impression(s) / ED Diagnoses Final diagnoses:  Other migraine without status migrainosus, not intractable  Left-sided weakness    Rx / DC Orders ED Discharge Orders     None          Victoriano Lain 06/28/22 Maurine Cane, MD 07/01/22 1045

## 2022-06-28 NOTE — ED Notes (Signed)
ED TO INPATIENT HANDOFF REPORT  ED Nurse Name and Phone #: Jacques Earthly Name/Age/Gender [name] 52 y.o. female Room/Bed: APA05/APA05  Code Status   Code Status: Full Code  Home/SNF/Other Home Patient oriented to: self, place, time, and situation Is this baseline? Yes   Triage Complete: Triage complete  Chief Complaint Left-sided weakness [R53.1]  Triage Note Pt with dizziness around 1300, left arm numbness since 1405. Pt states she has been having migraines for past month. Migraine this morning and took her Topamax, denies currently.     Allergies No Known Allergies  Level of Care/Admitting Diagnosis ED Disposition     ED Disposition  Admit   Condition  --   Comment  Hospital Area: [facility] [[id number]]  Level of Care: Telemetry Medical [104]  May place patient in observation at [facility] or [facility] if equivalent level of care is available:: No  Covid Evaluation: Asymptomatic - no recent exposure (last 10 days) testing not required  Diagnosis: Left-sided weakness [161096]  Admitting Physician: Chiquita Loth  Attending Physician: Randol Kern, DAWOOD S [[id number]]          B Medical/Surgery History Past Medical History:  Diagnosis Date   Diabetes (HCC)    Fibromyalgia    High cholesterol    Thyroid disease    Past Surgical History:  Procedure Laterality Date   caesarean       A IV Location/Drains/Wounds Patient Lines/Drains/Airways Status     Active Line/Drains/Airways     Name Placement date Placement time Site Days   Peripheral IV 06/28/22 20 G Left Antecubital 06/28/22  1605  Antecubital  less than 1            Intake/Output Last 24 hours No intake or output data in the 24 hours ending 06/28/22 2137  Labs/Imaging Results for orders placed or performed during the hospital encounter of 06/28/22 (from the past 48 hour(s))  CBG monitoring, ED     Status: Abnormal   Collection Time: 06/28/22  2:38  PM  Result Value Ref Range   Glucose-Capillary 230 (H) 70 - 99 mg/dL    Comment: Glucose reference range applies only to samples taken after fasting for at least 8 hours.  CBC with Differential     Status: None   Collection Time: 06/28/22  3:02 PM  Result Value Ref Range   WBC 7.0 4.0 - 10.5 K/uL   RBC 4.36 3.87 - 5.11 MIL/uL   Hemoglobin 13.5 12.0 - 15.0 g/dL   HCT 04.5 40.9 - 81.1 %   MCV 91.5 80.0 - 100.0 fL   MCH 31.0 26.0 - 34.0 pg   MCHC 33.8 30.0 - 36.0 g/dL   RDW 91.4 78.2 - 95.6 %   Platelets 155 150 - 400 K/uL   nRBC 0.0 0.0 - 0.2 %   Neutrophils Relative % 59 %   Neutro Abs 4.2 1.7 - 7.7 K/uL   Lymphocytes Relative 29 %   Lymphs Abs 2.0 0.7 - 4.0 K/uL   Monocytes Relative 8 %   Monocytes Absolute 0.6 0.1 - 1.0 K/uL   Eosinophils Relative 3 %   Eosinophils Absolute 0.2 0.0 - 0.5 K/uL   Basophils Relative 1 %   Basophils Absolute 0.1 0.0 - 0.1 K/uL   Immature Granulocytes 0 %   Abs Immature Granulocytes 0.02 0.00 - 0.07 K/uL    Comment: Performed at [facility], [address], [place], [place] [address]  Comprehensive metabolic panel  Status: Abnormal   Collection Time: 06/28/22  3:02 PM  Result Value Ref Range   Sodium 132 (L) 135 - 145 mmol/L   Potassium 4.2 3.5 - 5.1 mmol/L   Chloride 103 98 - 111 mmol/L   CO2 22 22 - 32 mmol/L   Glucose, Bld 221 (H) 70 - 99 mg/dL    Comment: Glucose reference range applies only to samples taken after fasting for at least 8 hours.   BUN 11 6 - 20 mg/dL   Creatinine, Ser 8.65 0.44 - 1.00 mg/dL   Calcium 8.5 (L) 8.9 - 10.3 mg/dL   Total Protein 6.1 (L) 6.5 - 8.1 g/dL   Albumin 3.4 (L) 3.5 - 5.0 g/dL   AST 16 15 - 41 U/L   ALT 17 0 - 44 U/L   Alkaline Phosphatase 97 38 - 126 U/L   Total Bilirubin 0.8 0.3 - 1.2 mg/dL   GFR, Estimated >78 >46 mL/min    Comment: (NOTE) Calculated using the CKD-EPI Creatinine Equation (2021)    Anion gap 7 5 - 15    Comment: Performed at [facility], [address], [place], [place]  [address]  hCG, quantitative, pregnancy     Status: Abnormal   Collection Time: 06/28/22  3:02 PM  Result Value Ref Range   hCG, Beta Chain, Quant, S 8 (H) <5 mIU/mL    Comment:          GEST. AGE      CONC.  (mIU/mL)   <=1 WEEK        5 - 50     2 WEEKS       50 - 500     3 WEEKS       100 - 10,000     4 WEEKS     1,000 - 30,000     5 WEEKS     3,500 - 115,000   6-8 WEEKS     12,000 - 270,000    12 WEEKS     15,000 - 220,000        FEMALE AND NON-PREGNANT FEMALE:     LESS THAN 5 mIU/mL Performed at [facility], [address] [place], [place] [address]   Protime-INR     Status: None   Collection Time: 06/28/22  4:04 PM  Result Value Ref Range   Prothrombin Time 13.1 11.4 - 15.2 seconds   INR 1.0 0.8 - 1.2    Comment: (NOTE) INR goal varies based on device and disease states. Performed at [facility], [address] [place], [place] [address]   APTT     Status: None   Collection Time: 06/28/22  4:04 PM  Result Value Ref Range   aPTT 25 24 - 36 seconds    Comment: Performed at [facility], [address] [place], [place] [address]  Ethanol     Status: None   Collection Time: 06/28/22  4:04 PM  Result Value Ref Range   Alcohol, Ethyl (B) <10 <10 mg/dL    Comment: (NOTE) Lowest detectable limit for serum alcohol is 10 mg/dL.  For medical purposes only. Performed at [facility], [address] [place], [place] [address]   Urinalysis, Routine w reflex microscopic -Urine, Clean Catch     Status: Abnormal   Collection Time: 06/28/22  5:10 PM  Result Value Ref Range   Color, Urine STRAW (A) YELLOW   APPearance CLEAR CLEAR   Specific Gravity, Urine 1.003 (L) 1.005 - 1.030  pH 7.0 5.0 - 8.0   Glucose, UA 50 (A) NEGATIVE mg/dL   Hgb urine dipstick NEGATIVE NEGATIVE   Bilirubin Urine NEGATIVE NEGATIVE   Ketones, ur NEGATIVE NEGATIVE mg/dL   Protein, ur NEGATIVE NEGATIVE mg/dL   Nitrite NEGATIVE NEGATIVE   Leukocytes,Ua NEGATIVE NEGATIVE    Comment: Performed at [facility], [address] [place], [place] [address]  Rapid urine drug screen (hospital performed)     Status: Abnormal   Collection Time: 06/28/22  5:10 PM  Result Value Ref Range   Opiates NONE DETECTED NONE DETECTED   Cocaine NONE DETECTED NONE DETECTED   Benzodiazepines NONE DETECTED NONE DETECTED   Amphetamines NONE DETECTED NONE DETECTED   Tetrahydrocannabinol POSITIVE (A) NONE DETECTED   Barbiturates NONE DETECTED NONE DETECTED    Comment: (NOTE) DRUG SCREEN FOR MEDICAL PURPOSES ONLY.  IF CONFIRMATION IS NEEDED FOR ANY PURPOSE, NOTIFY LAB WITHIN 5 DAYS.  LOWEST DETECTABLE LIMITS FOR URINE DRUG SCREEN Drug Class                     Cutoff (ng/mL) Amphetamine and metabolites    1000 Barbiturate and metabolites    200 Benzodiazepine                 200 Opiates and metabolites        300 Cocaine and metabolites        300 THC                            50 Performed at [facility], [address] [place], [place] [address]    CT ANGIO HEAD NECK W WO CM (CODE STROKE)  Result Date: 06/28/2022 CLINICAL DATA:  Neuro deficit, acute, stroke suspected. EXAM: CT ANGIOGRAPHY HEAD AND NECK WITH AND WITHOUT CONTRAST TECHNIQUE: Multidetector CT imaging of the head and neck was performed using the standard protocol during bolus administration of intravenous contrast. Multiplanar CT image reconstructions and MIPs were obtained to evaluate the vascular anatomy. Carotid stenosis measurements (when applicable) are obtained utilizing NASCET criteria, using the distal internal carotid diameter as the denominator. RADIATION DOSE REDUCTION: This exam was performed according to the departmental dose-optimization program which includes automated exposure control, adjustment of the mA and/or kV according to patient size and/or use of iterative reconstruction technique. CONTRAST:  75mL OMNIPAQUE IOHEXOL 350 MG/ML SOLN COMPARISON:  Head CT 06/28/2022.  MRI brain 09/11/2021. FINDINGS: CTA NECK FINDINGS Aortic arch:  Three-vessel arch configuration. Arch vessel origins are patent. Right carotid system: No evidence of dissection, stenosis (50% or greater), or occlusion. Left carotid system: No evidence of dissection, stenosis (50% or greater), or occlusion. Vertebral arteries: Codominant. No evidence of dissection, stenosis (50% or greater), or occlusion. Skeleton: Mild cervical spondylosis without high-grade spinal canal stenosis. Other neck: Unremarkable. Upper chest: Unremarkable. Review of the MIP images confirms the above findings CTA HEAD FINDINGS Anterior circulation: Intracranial ICAs are patent without stenosis or aneurysm. The proximal ACAs and MCAs are patent without stenosis or aneurysm. Distal branches are symmetric. Posterior circulation: Normal basilar artery. The SCAs, AICAs and PICAs are patent proximally. The PCAs are patent proximally without stenosis or aneurysm. Distal branches are symmetric. Venous sinuses: Patent. Anatomic variants: None. Review of the MIP images confirms the above findings IMPRESSION: No large vessel occlusion, stenosis, dissection, or aneurysm in the head or neck vessels. Electronically Signed   By: Orvan Falconer M.D.   On: 06/28/2022 18:00  CT HEAD CODE STROKE WO CONTRAST`  Result Date: 06/28/2022 CLINICAL DATA:  Code stroke. Confusion, weakness, slurred speech, dizziness. EXAM: CT HEAD WITHOUT CONTRAST TECHNIQUE: Contiguous axial images were obtained from the base of the skull through the vertex without intravenous contrast. RADIATION DOSE REDUCTION: This exam was performed according to the departmental dose-optimization program which includes automated exposure control, adjustment of the mA and/or kV according to patient size and/or use of iterative reconstruction technique. COMPARISON:  Brain MRI 09/11/2021 FINDINGS: Brain: There is no acute intracranial hemorrhage, extra-axial fluid collection, or acute infarct. Parenchymal volume is normal. The ventricles are normal in size.  Gray-white differentiation is preserved. There is asymmetric mineralization in the thalami, right more than left. This is unchanged compared to the brain MRI from 2023. The pituitary and suprasellar region are normal. There is no mass lesion. There is no mass effect or midline shift. Vascular: No hyperdense vessel or unexpected calcification. Skull: Normal. Negative for fracture or focal lesion. Sinuses/Orbits: The imaged paranasal sinuses are clear. The mastoid air cells and middle ear cavities are clear. A right lens implant is noted. The globes and orbits are otherwise unremarkable. Other: None. ASPECTS [facility] Stroke Program Early CT Score) - Ganglionic level infarction (caudate, lentiform nuclei, internal capsule, insula, M1-M3 cortex): 7 - Supraganglionic infarction (M4-M6 cortex): 3 Total score (0-10 with 10 being normal): 10 IMPRESSION: No acute intracranial pathology. These results were called by telephone at the time of interpretation on 06/28/2022 at 4:26 pm to provider Dr Estell Harpin, who verbally acknowledged these results. Electronically Signed   By: Lesia Hausen M.D.   On: 06/28/2022 16:30    Pending Labs Unresulted Labs (From admission, onward)     Start     Ordered   06/29/22 0500  Lipid panel  Tomorrow morning,   R        06/28/22 1733   06/29/22 0500  TSH  Tomorrow morning,   R        06/28/22 1733   06/29/22 0500  Lipid panel  (Labs)  Tomorrow morning,   R       Comments: Fasting    06/28/22 1834   06/29/22 0500  Vitamin B12  Tomorrow morning,   R        06/28/22 1757   06/29/22 0500  Hemoglobin A1c  Tomorrow morning,   R        06/28/22 1757   06/28/22 1758  Hemoglobin A1c  Add-on,   AD        06/28/22 1757            Vitals/Pain Today's Vitals   06/28/22 1830 06/28/22 1920 06/28/22 1930 06/28/22 2000  BP: (!) 140/74  126/77 132/76  Pulse: 93  88 92  Resp: 12  14 17   Temp:   98.8 F (37.1 C)   TempSrc:   Oral   SpO2: 94%  99% 98%  Weight:      Height:       PainSc:  5       Isolation Precautions No active isolations  Medications Medications  aspirin EC tablet 81 mg (has no administration in time range)  rosuvastatin (CRESTOR) tablet 5 mg (has no administration in time range)  DULoxetine (CYMBALTA) DR capsule 60 mg (60 mg Oral Given 06/28/22 1933)  levothyroxine (SYNTHROID) tablet 75 mcg (75 mcg Oral Given 06/28/22 1932)  topiramate (TOPAMAX) tablet 50 mg (50 mg Oral Given 06/28/22 2121)  cholecalciferol (VITAMIN D3) 25 MCG (1000 UNIT) tablet 1,000 Units (  1,000 Units Oral Given 06/28/22 1933)   stroke: early stages of recovery book (has no administration in time range)  0.9 %  sodium chloride infusion ( Intravenous New Bag/Given 06/28/22 1936)  acetaminophen (TYLENOL) tablet 650 mg (has no administration in time range)    Or  acetaminophen (TYLENOL) 160 MG/5ML solution 650 mg (has no administration in time range)    Or  acetaminophen (TYLENOL) suppository 650 mg (has no administration in time range)  senna-docusate (Senokot-S) tablet 1 tablet (has no administration in time range)  enoxaparin (LOVENOX) injection 40 mg (has no administration in time range)  thiamine (VITAMIN B1) tablet 100 mg (100 mg Oral Given 06/28/22 1832)  folic acid (FOLVITE) tablet 1 mg (1 mg Oral Given 06/28/22 1832)  sodium chloride flush (NS) 0.9 % injection 3 mL (3 mLs Intravenous Given 06/28/22 1631)  aspirin tablet 325 mg (325 mg Oral Given 06/28/22 1721)  iohexol (OMNIPAQUE) 350 MG/ML injection 75 mL (75 mLs Intravenous Contrast Given 06/28/22 1744)    Mobility walks     Focused Assessments    R Recommendations: See Admitting Provider Note  Report given to:   Additional Notes:

## 2022-06-28 NOTE — ED Notes (Signed)
Report given and accepted by receiving nurse and [id number]

## 2022-06-28 NOTE — ED Triage Notes (Signed)
Pt with dizziness around 1300, left arm numbness since 1405. Pt states she has been having migraines for past month. Migraine this morning and took her Topamax, denies currently.

## 2022-06-28 NOTE — Consult Note (Signed)
[facility] Telemedicine Consult   Requesting Provider: Dr. Estell Harpin  Chief Complaint: Code stroke  HPI: 52 yo F with history of with a history of alcohol use disorder, fibromyalgia, panic attacks, hypothyroidism, migraines, HLD, DM, hypothyroidism who presents for code stroke.   Per pt, her daughter and RN, she has had frequent migraine headaches for the last months, 4 days/week, sharp and pressure feeding, bifrontal more on the right frontal behind right eye, 9/10, denies nausea vomiting but admitted photophobia and phonophobia.  Headache lasting several hours to a day.  Has been following with Dr. Delena Bali at [facility], recently increased topiramate dose and prescribed sumatriptan which has not been used yet.  This morning she woke up, reported headache, however she text to her daughter around 16 AM which seemed normal.  Patient states that her headache gradually getting better as day went on.  Around p.m., she drove to pharmacy picking up her thyroid medication, she told the pharmacist she saw old man in the parking lot but the pharmacy did not see anybody.  There was concern whether patient had hallucination at the time.  She drove back and had to pull over complaining of confusion, not feeling right, left-sided numbness tingling and weakness, feeling about to pass out.  She called her daughter, who called EMS.  On ED arrival, BP 161/97, glucose 230.  Patient denies any similar episodes happened before. 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   LKW: 2 PM tpa given?: No, non disabling symptoms and likely not stroke IR Thrombectomy? No, nondisabling symptoms and likely not stroke Modified Rankin Scale: 1-No significant post stroke disability and can perform usual duties with stroke symptoms   Exam: Vitals:   06/28/22 1442 06/28/22 1504  BP:  (!) 145/82  Pulse: 91   Resp: 10 19  Temp:    SpO2: 100%      Temp:  [98.7 F (37.1 C)] 98.7 F  (37.1 C) (06/08 1439) Pulse Rate:  [91-93] 91 (06/08 1442) Resp:  [10-19] 19 (06/08 1504) BP: (144-145)/(82-86) 145/82 (06/08 1504) SpO2:  [100 %] 100 % (06/08 1442) Weight:  [63.5 kg] 63.5 kg (06/08 1437)  General - Well nourished, well developed, mildly lethargic.  Ophthalmologic - fundi not visualized due to noncooperation.  Cardiovascular - Regular rhythm and rate.  Neuro - lethargic, refer to close eyes, however able to open on voice, able to maintain eye opening on request, orientated to age, place, time. No aphasia, fluent language, following all simple commands.  Slight dysarthria due to lethargy.  Able to name and repeat and read. No gaze palsy, tracking bilaterally, visual field full. No facial droop. Tongue midline. RUE no drift, LUE no drift but lift half way only due to left shoulder pain.  Left hand drip reported weaker than the right.  Bilateral lower extremity drift to bed beyond 5 seconds.  Sensation subjectively decreased on the left arm and leg, b/l FTN and heel-to-shin intact, however slow, gait not tested.  Marland Kitchen   NIH Stroke Scale  Level Of Consciousness 0=Alert; keenly responsive 1=Arouse to minor stimulation 2=Requires repeated stimulation to arouse or movements to pain 3=postures or unresponsive 0  LOC Questions to Month and Age 48=Answers both questions correctly 1=Answers one question correctly or dysarthria/intubated/trauma/language barrier 2=Answers neither question correctly or aphasia 0  LOC Commands      -Open/Close eyes     -Open/close grip     -Pantomime commands if communication barrier  0=Performs both tasks correctly 1=Performs one task correctly 2=Performs neighter task correctly 0  Best Gaze     -Only assess horizontal gaze 0=Normal 1=Partial gaze palsy 2=Forced deviation, or total gaze paresis 0  Visual 0=No visual loss 1=Partial hemianopia 2=Complete hemianopia 3=Bilateral hemianopia (blind including cortical blindness) 0  Facial Palsy      -Use grimace if obtunded 0=Normal symmetrical movement 1=Minor paralysis (asymmetry) 2=Partial paralysis (lower face) 3=Complete paralysis (upper and lower face) 0  Motor  0=No drift for 10/5 seconds 1=Drift, but does not hit bed 2=Some antigravity effort, hits  bed 3=No effort against gravity, limb falls 4=No movement 0=Amputation/joint fusion Right Arm 0     Leg 0    Left Arm 1     Leg 1  Limb Ataxia     - FNT/HTS 0=Absent or does not understand or paralyzed or amputation/joint fusion 1=Present in one limb 2=Present in two limbs 0  Sensory 0=Normal 1=Mild to moderate sensory loss 2=Severe to total sensory loss or coma/unresponsive 1  Best Language 0=No aphasia, normal 1=Mild to moderate aphasia 2=Severe aphasia 3=Mute, global aphasia, or coma/unresponsive 0  Dysarthria 0=Normal 1=Mild to moderate 2=Severe, unintelligible or mute/anarthric 0=intubated/unable to test 1  Extinction/Neglect 0=No abnormality 1=visual/tactile/auditory/spatia/personal inattention/Extinction to bilateral simultaneous stimulation 2=Profound neglect/extinction more than 1 modality  0  Total   4      Imaging Reviewed:  CT HEAD CODE STROKE WO CONTRAST`  Result Date: 06/28/2022 CLINICAL DATA:  Code stroke. Confusion, weakness, slurred speech, dizziness. EXAM: CT HEAD WITHOUT CONTRAST TECHNIQUE: Contiguous axial images were obtained from the base of the skull through the vertex without intravenous contrast. RADIATION DOSE REDUCTION: This exam was performed according to the departmental dose-optimization program which includes automated exposure control, adjustment of the mA and/or kV according to patient size and/or use of iterative reconstruction technique. COMPARISON:  Brain MRI 09/11/2021 FINDINGS: Brain: There is no acute intracranial hemorrhage, extra-axial fluid collection, or acute infarct. Parenchymal volume is normal. The ventricles are normal in size. Gray-white differentiation is preserved.  There is asymmetric mineralization in the thalami, right more than left. This is unchanged compared to the brain MRI from 2023. The pituitary and suprasellar region are normal. There is no mass lesion. There is no mass effect or midline shift. Vascular: No hyperdense vessel or unexpected calcification. Skull: Normal. Negative for fracture or focal lesion. Sinuses/Orbits: The imaged paranasal sinuses are clear. The mastoid air cells and middle ear cavities are clear. A right lens implant is noted. The globes and orbits are otherwise unremarkable. Other: None. ASPECTS [facility] Stroke Program Early CT Score) - Ganglionic level infarction (caudate, lentiform nuclei, internal capsule, insula, M1-M3 cortex): 7 - Supraganglionic infarction (M4-M6 cortex): 3 Total score (0-10 with 10 being normal): 10 IMPRESSION: No acute intracranial pathology. These results were called by telephone at the time of interpretation on 06/28/2022 at 4:26 pm to provider Dr Estell Harpin, who verbally acknowledged these results. Electronically Signed   By: Lesia Hausen M.D.   On: 06/28/2022 16:30     Labs reviewed in epic and pertinent values follow: Sodium 132, platelet 155, creatinine 0.78  Assessment:  52 yo F with history of with a history of alcohol use disorder, fibromyalgia, panic attacks, hypothyroidism, migraines, HLD, DM, hypothyroidism who presents for confusion, not feeling well, left-sided numbness and heaviness in the setting of recently frequent migraine headaches.  Time onset 2 PM, NIH score 4.  CT no acute abnormality.  Patient not TNK candidate given nondisabling symptoms.  Etiology for  patient's symptoms concerning for complicated migraine versus anxiety, less likely seizure or stroke.  Recommend further workup with MRI, CTA head and neck, 2D echo.  However, there is no MRI on the weekend at [facility] and patient refused to transfer to [facility], it is okay to obtain MRI on Monday.   Recommendations:  Continue further neuro work up   Frequent neuro checks Telemetry monitoring MRI brain on Monday.  If negative, can discharge on same day CTA head and neck  Echocardiogram  UDS, fasting lipid panel and HgbA1C PT/OT/speech consult Permissive hypertension (only treat if BP > 220/120 unless a lower blood pressure is clinically necessary) for 24-48 hours post stroke onset GI and DVT prophylaxis  Aspirin 325 load followed by aspirin 81 daily Symptomatic treating headache with Tylenol or Fioricet. Discussed with Burgess Amor ED PA  We will follow    Consult Participants: Patient, daughter, RN, stroke response RN, ED PA Location of the provider: [facility] Location of the patient: [facility]  Time Code Stroke Page received:  1623 Time neurologist arrived:  1625 Time NIHSS completed: 1648    This consult was provided via telemedicine with 2-way video and audio communication. The patient/family was informed that care would be provided in this way and agreed to receive care in this manner.   This patient is receiving care for possible acute neurological changes. There was 60 minutes of care by this provider at the time of service, including time for direct evaluation via telemedicine, review of medical records, imaging studies and discussion of findings with providers, the patient and/or family.  Marvel Plan, MD PhD Stroke Neurology 06/28/2022 5:01 PM

## 2022-06-28 NOTE — Progress Notes (Signed)
Code stroke time documentation  1603 call time 1604 beeper time 1610 exam started 1616 exam finished 1620 exam completed in epic 1617 [facility] radiology called

## 2022-06-29 ENCOUNTER — Observation Stay (HOSPITAL_BASED_OUTPATIENT_CLINIC_OR_DEPARTMENT_OTHER): Payer: BC Managed Care – PPO

## 2022-06-29 DIAGNOSIS — E1065 Type 1 diabetes mellitus with hyperglycemia: Secondary | ICD-10-CM | POA: Diagnosis not present

## 2022-06-29 DIAGNOSIS — Z79899 Other long term (current) drug therapy: Secondary | ICD-10-CM | POA: Diagnosis not present

## 2022-06-29 DIAGNOSIS — G43809 Other migraine, not intractable, without status migrainosus: Secondary | ICD-10-CM | POA: Diagnosis not present

## 2022-06-29 DIAGNOSIS — E039 Hypothyroidism, unspecified: Secondary | ICD-10-CM | POA: Diagnosis not present

## 2022-06-29 DIAGNOSIS — R42 Dizziness and giddiness: Secondary | ICD-10-CM | POA: Diagnosis not present

## 2022-06-29 DIAGNOSIS — I639 Cerebral infarction, unspecified: Secondary | ICD-10-CM | POA: Diagnosis not present

## 2022-06-29 DIAGNOSIS — R531 Weakness: Secondary | ICD-10-CM

## 2022-06-29 DIAGNOSIS — F4323 Adjustment disorder with mixed anxiety and depressed mood: Secondary | ICD-10-CM | POA: Diagnosis not present

## 2022-06-29 DIAGNOSIS — Z794 Long term (current) use of insulin: Secondary | ICD-10-CM | POA: Diagnosis not present

## 2022-06-29 LAB — LIPID PANEL
Cholesterol: 191 mg/dL (ref 0–200)
Cholesterol: 173 mg/dL (ref 0–200)
HDL: 69 mg/dL (ref >40)
HDL: 63 mg/dL (ref >40)
LDL Cholesterol: 101 mg/dL — ABNORMAL HIGH (ref 0–99)
LDL Cholesterol: 96 mg/dL (ref 0–99)
Total CHOL/HDL Ratio: 2.8 RATIO
Total CHOL/HDL Ratio: 2.7 RATIO
Triglycerides: 106 mg/dL (ref <150)
Triglycerides: 71 mg/dL (ref <150)
VLDL: 21 mg/dL (ref 0–40)
VLDL: 14 mg/dL (ref 0–40)

## 2022-06-29 LAB — GLUCOSE, CAPILLARY
Glucose-Capillary: 111 mg/dL — ABNORMAL HIGH (ref 70–99)
Glucose-Capillary: 217 mg/dL — ABNORMAL HIGH (ref 70–99)
Glucose-Capillary: 313 mg/dL — ABNORMAL HIGH (ref 70–99)

## 2022-06-29 LAB — ECHOCARDIOGRAM COMPLETE
AR max vel: 1.87 cm2
AV Area VTI: 1.79 cm2
AV Area mean vel: 1.87 cm2
AV Mean grad: 3 mmHg
AV Peak grad: 5.7 mmHg
Ao pk vel: 1.19 m/s
Area-P 1/2: 3.37 cm2
Height: 62 in
S' Lateral: 2.5 cm
Weight: 2321 oz

## 2022-06-29 LAB — TSH: TSH: 2.642 u[IU]/mL (ref 0.350–4.500)

## 2022-06-29 LAB — HEMOGLOBIN A1C
Hgb A1c MFr Bld: 9.2 % — ABNORMAL HIGH (ref 4.8–5.6)
Mean Plasma Glucose: 217.34 mg/dL

## 2022-06-29 LAB — VITAMIN B12: Vitamin B-12: 4267 pg/mL — ABNORMAL HIGH (ref 180–914)

## 2022-06-29 MED ORDER — ROSUVASTATIN CALCIUM 5 MG PO TABS: 5.0000 mg | ORAL_TABLET | Freq: Every day | ORAL | 2 refills | Status: AC

## 2022-06-29 MED ORDER — ROSUVASTATIN CALCIUM 5 MG PO TABS
5.0000 mg | ORAL_TABLET | Freq: Every day | ORAL | Status: DC
  Administered 2022-06-29: 5 mg via ORAL
  Filled 2022-06-29: qty 1

## 2022-06-29 MED ORDER — BOOST / RESOURCE BREEZE PO LIQD CUSTOM
1.0000 | Freq: Three times a day (TID) | ORAL | Status: DC
  Administered 2022-06-29: 1 via ORAL

## 2022-06-29 MED ORDER — ASPIRIN 81 MG PO TBEC: 81.0000 mg | DELAYED_RELEASE_TABLET | Freq: Every day | ORAL | 12 refills | Status: AC

## 2022-06-29 NOTE — Progress Notes (Signed)
STROKE TEAM PROGRESS NOTE   SUBJECTIVE (INTERVAL HISTORY) No family is at the bedside.  Overall her condition is completely resolved. Pt lying in bed, no HA, back to baseline. Stroke work up done. MRI negative. Need to continue to follow up with Dr. Delena Bali at [facility] for migraine.    OBJECTIVE Temp:  [98.5 F (36.9 C)-99 F (37.2 C)] 99 F (37.2 C) (06/09 1249) Pulse Rate:  [78-104] 104 (06/09 1249) Cardiac Rhythm: Normal sinus rhythm (06/09 0915) Resp:  [10-19] 16 (06/09 1249) BP: (126-161)/(68-102) 127/72 (06/09 1249) SpO2:  [94 %-100 %] 96 % (06/09 1249) Weight:  [63.5 kg-65.8 kg] 65.8 kg (06/08 2309)  Recent Labs  Lab 06/28/22 1438 06/28/22 2329 06/29/22 0322 06/29/22 0737 06/29/22 1251  GLUCAP 230* 293* 111* 217* 313*   Recent Labs  Lab 06/28/22 1502  NA 132*  K 4.2  CL 103  CO2 22  GLUCOSE 221*  BUN 11  CREATININE 0.78  CALCIUM 8.5*   Recent Labs  Lab 06/28/22 1502  AST 16  ALT 17  ALKPHOS 97  BILITOT 0.8  PROT 6.1*  ALBUMIN 3.4*   Recent Labs  Lab 06/28/22 1502  WBC 7.0  NEUTROABS 4.2  HGB 13.5  HCT 39.9  MCV 91.5  PLT 155   No results for input(s): "CKTOTAL", "CKMB", "CKMBINDEX", "TROPONINI" in the last 168 hours. Recent Labs    06/28/22 1604  LABPROT 13.1  INR 1.0   Recent Labs    06/28/22 1710  COLORURINE STRAW*  LABSPEC 1.003*  PHURINE 7.0  GLUCOSEU 50*  HGBUR NEGATIVE  BILIRUBINUR NEGATIVE  KETONESUR NEGATIVE  PROTEINUR NEGATIVE  NITRITE NEGATIVE  LEUKOCYTESUR NEGATIVE       Component Value Date/Time   CHOL 173 06/29/2022 0905   TRIG 71 06/29/2022 0905   HDL 63 06/29/2022 0905   CHOLHDL 2.7 06/29/2022 0905   VLDL 14 06/29/2022 0905   LDLCALC 96 06/29/2022 0905   Lab Results  Component Value Date   HGBA1C 9.1 (H) 06/28/2022      Component Value Date/Time   LABOPIA NONE DETECTED 06/28/2022 1710   COCAINSCRNUR NONE DETECTED 06/28/2022 1710   LABBENZ NONE DETECTED 06/28/2022 1710   AMPHETMU NONE DETECTED 06/28/2022  1710   THCU POSITIVE (A) 06/28/2022 1710   LABBARB NONE DETECTED 06/28/2022 1710    Recent Labs  Lab 06/28/22 1604  ETH <10    I have personally reviewed the radiological images below and agree with the radiology interpretations.  ECHOCARDIOGRAM COMPLETE  Result Date: 06/29/2022    ECHOCARDIOGRAM REPORT   Patient Name:   DESHANNA HELLSTROM Date of Exam: 06/29/2022 Medical Rec #:  [id number]           Height:       62.0 in Accession #:    [id number]          Weight:       145.1 lb Date of Birth:  29-Feb-1970           BSA:          1.668 m Patient Age:    52 years            BP:           146/84 mmHg Patient Gender: F                   HR:           84 bpm. Exam Location:  Inpatient Procedure: 2D Echo, Cardiac Doppler and Color  Doppler Indications:    Stroke I63.9  History:        Patient has no prior history of Echocardiogram examinations.                 Risk Factors:Dyslipidemia, Diabetes and Non-Smoker.  Sonographer:    Dondra Prader RVT RCS Referring Phys: [id number] DAWOOD S ELGERGAWY IMPRESSIONS  1. Left ventricular ejection fraction, by estimation, is 65 to 70%. The left ventricle has normal function. The left ventricle has no regional wall motion abnormalities. Left ventricular diastolic parameters were normal.  2. Right ventricular systolic function is normal. The right ventricular size is normal. Tricuspid regurgitation signal is inadequate for assessing PA pressure.  3. The mitral valve is grossly normal. Trivial mitral valve regurgitation. No evidence of mitral stenosis.  4. The aortic valve was not well visualized. Aortic valve regurgitation is not visualized. No aortic stenosis is present.  5. The inferior vena cava is normal in size with greater than 50% respiratory variability, suggesting right atrial pressure of 3 mmHg. Conclusion(s)/Recommendation(s): No intracardiac source of embolism detected on this transthoracic study. Consider a transesophageal echocardiogram to exclude cardiac source of  embolism if clinically indicated. FINDINGS  Left Ventricle: Left ventricular ejection fraction, by estimation, is 65 to 70%. The left ventricle has normal function. The left ventricle has no regional wall motion abnormalities. The left ventricular internal cavity size was normal in size. There is  no left ventricular hypertrophy. Left ventricular diastolic parameters were normal. Right Ventricle: The right ventricular size is normal. No increase in right ventricular wall thickness. Right ventricular systolic function is normal. Tricuspid regurgitation signal is inadequate for assessing PA pressure. Left Atrium: Left atrial size was normal in size. Right Atrium: Right atrial size was normal in size. Pericardium: There is no evidence of pericardial effusion. Presence of epicardial fat layer. Mitral Valve: The mitral valve is grossly normal. Trivial mitral valve regurgitation. No evidence of mitral valve stenosis. Tricuspid Valve: The tricuspid valve is grossly normal. Tricuspid valve regurgitation is trivial. No evidence of tricuspid stenosis. Aortic Valve: The aortic valve was not well visualized. Aortic valve regurgitation is not visualized. No aortic stenosis is present. Aortic valve mean gradient measures 3.0 mmHg. Aortic valve peak gradient measures 5.7 mmHg. Aortic valve area, by VTI measures 1.79 cm. Pulmonic Valve: The pulmonic valve was grossly normal. Pulmonic valve regurgitation is not visualized. No evidence of pulmonic stenosis. Aorta: The aortic root and ascending aorta are structurally normal, with no evidence of dilitation. Venous: The inferior vena cava is normal in size with greater than 50% respiratory variability, suggesting right atrial pressure of 3 mmHg. IAS/Shunts: The atrial septum is grossly normal.  LEFT VENTRICLE PLAX 2D LVIDd:         4.10 cm   Diastology LVIDs:         2.50 cm   LV e' medial:    9.14 cm/s LV PW:         1.20 cm   LV E/e' medial:  6.0 LV IVS:        0.90 cm   LV e'  lateral:   12.30 cm/s LVOT diam:     1.90 cm   LV E/e' lateral: 4.4 LV SV:         35 LV SV Index:   21 LVOT Area:     2.84 cm  RIGHT VENTRICLE             IVC RV Basal diam:  2.40 cm  IVC diam: 1.70 cm RV Mid diam:    2.10 cm RV S prime:     11.50 cm/s TAPSE (M-mode): 1.9 cm LEFT ATRIUM             Index        RIGHT ATRIUM          Index LA diam:        3.10 cm 1.86 cm/m   RA Area:     6.78 cm LA Vol (A2C):   16.5 ml 9.89 ml/m   RA Volume:   11.30 ml 6.78 ml/m LA Vol (A4C):   18.0 ml 10.79 ml/m LA Biplane Vol: 17.9 ml 10.73 ml/m  AORTIC VALVE                    PULMONIC VALVE AV Area (Vmax):    1.87 cm     PV Vmax:       0.85 m/s AV Area (Vmean):   1.87 cm     PV Peak grad:  2.9 mmHg AV Area (VTI):     1.79 cm AV Vmax:           119.00 cm/s AV Vmean:          79.800 cm/s AV VTI:            0.195 m AV Peak Grad:      5.7 mmHg AV Mean Grad:      3.0 mmHg LVOT Vmax:         78.40 cm/s LVOT Vmean:        52.500 cm/s LVOT VTI:          0.123 m LVOT/AV VTI ratio: 0.63  AORTA Ao Root diam: 3.20 cm Ao Asc diam:  2.80 cm MITRAL VALVE MV Area (PHT): 3.37 cm    SHUNTS MV Decel Time: 225 msec    Systemic VTI:  0.12 m MV E velocity: 54.60 cm/s  Systemic Diam: 1.90 cm MV A velocity: 56.60 cm/s MV E/A ratio:  0.96 Lennie Odor MD Electronically signed by Lennie Odor MD Signature Date/Time: 06/29/2022/11:18:24 AM    Final    MR BRAIN WO CONTRAST  Result Date: 06/29/2022 CLINICAL DATA:  Follow-up examination for stroke. EXAM: MRI HEAD WITHOUT CONTRAST TECHNIQUE: Multiplanar, multiecho pulse sequences of the brain and surrounding structures were obtained without intravenous contrast. COMPARISON:  Prior studies from 06/28/2022 and earlier. FINDINGS: Brain: Cerebral volume within normal limits. No significant cerebral white matter disease for age. Probable tiny remote lacunar infarct at the left posterior lentiform nucleus/in paternal capsule (series 6, image 18). No evidence for acute or subacute ischemia.  Gray-white matter differentiation maintained. No acute or chronic intracranial or products. Mineralization involving the thalami noted, stable from prior. No mass lesion, midline shift or mass effect. No hydrocephalus or extra-axial fluid collection. Pituitary gland and suprasellar region within normal limits. Vascular: Major intracranial vascular flow voids are maintained. Skull and upper cervical spine: Craniocervical junction normal. Bone marrow signal intensity within normal limits. No scalp soft tissue abnormality. Sinuses/Orbits: Prior ocular lens replacement on the right. Paranasal sinuses are clear. No mastoid effusion. Other: None. IMPRESSION: 1. No acute intracranial abnormality. 2. Probable tiny remote lacunar infarct at the left posterior lentiform nucleus/internal capsule 3. Otherwise normal brain MRI for age. Electronically Signed   By: Rise Mu M.D.   On: 06/29/2022 01:38   CT ANGIO HEAD NECK W WO CM (CODE STROKE)  Result Date: 06/28/2022 CLINICAL DATA:  Neuro deficit, acute, stroke  suspected. EXAM: CT ANGIOGRAPHY HEAD AND NECK WITH AND WITHOUT CONTRAST TECHNIQUE: Multidetector CT imaging of the head and neck was performed using the standard protocol during bolus administration of intravenous contrast. Multiplanar CT image reconstructions and MIPs were obtained to evaluate the vascular anatomy. Carotid stenosis measurements (when applicable) are obtained utilizing NASCET criteria, using the distal internal carotid diameter as the denominator. RADIATION DOSE REDUCTION: This exam was performed according to the departmental dose-optimization program which includes automated exposure control, adjustment of the mA and/or kV according to patient size and/or use of iterative reconstruction technique. CONTRAST:  75mL OMNIPAQUE IOHEXOL 350 MG/ML SOLN COMPARISON:  Head CT 06/28/2022.  MRI brain 09/11/2021. FINDINGS: CTA NECK FINDINGS Aortic arch: Three-vessel arch configuration. Arch vessel  origins are patent. Right carotid system: No evidence of dissection, stenosis (50% or greater), or occlusion. Left carotid system: No evidence of dissection, stenosis (50% or greater), or occlusion. Vertebral arteries: Codominant. No evidence of dissection, stenosis (50% or greater), or occlusion. Skeleton: Mild cervical spondylosis without high-grade spinal canal stenosis. Other neck: Unremarkable. Upper chest: Unremarkable. Review of the MIP images confirms the above findings CTA HEAD FINDINGS Anterior circulation: Intracranial ICAs are patent without stenosis or aneurysm. The proximal ACAs and MCAs are patent without stenosis or aneurysm. Distal branches are symmetric. Posterior circulation: Normal basilar artery. The SCAs, AICAs and PICAs are patent proximally. The PCAs are patent proximally without stenosis or aneurysm. Distal branches are symmetric. Venous sinuses: Patent. Anatomic variants: None. Review of the MIP images confirms the above findings IMPRESSION: No large vessel occlusion, stenosis, dissection, or aneurysm in the head or neck vessels. Electronically Signed   By: Orvan Falconer M.D.   On: 06/28/2022 18:00   CT HEAD CODE STROKE WO CONTRAST`  Result Date: 06/28/2022 CLINICAL DATA:  Code stroke. Confusion, weakness, slurred speech, dizziness. EXAM: CT HEAD WITHOUT CONTRAST TECHNIQUE: Contiguous axial images were obtained from the base of the skull through the vertex without intravenous contrast. RADIATION DOSE REDUCTION: This exam was performed according to the departmental dose-optimization program which includes automated exposure control, adjustment of the mA and/or kV according to patient size and/or use of iterative reconstruction technique. COMPARISON:  Brain MRI 09/11/2021 FINDINGS: Brain: There is no acute intracranial hemorrhage, extra-axial fluid collection, or acute infarct. Parenchymal volume is normal. The ventricles are normal in size. Gray-white differentiation is preserved. There  is asymmetric mineralization in the thalami, right more than left. This is unchanged compared to the brain MRI from 2023. The pituitary and suprasellar region are normal. There is no mass lesion. There is no mass effect or midline shift. Vascular: No hyperdense vessel or unexpected calcification. Skull: Normal. Negative for fracture or focal lesion. Sinuses/Orbits: The imaged paranasal sinuses are clear. The mastoid air cells and middle ear cavities are clear. A right lens implant is noted. The globes and orbits are otherwise unremarkable. Other: None. ASPECTS [facility] Stroke Program Early CT Score) - Ganglionic level infarction (caudate, lentiform nuclei, internal capsule, insula, M1-M3 cortex): 7 - Supraganglionic infarction (M4-M6 cortex): 3 Total score (0-10 with 10 being normal): 10 IMPRESSION: No acute intracranial pathology. These results were called by telephone at the time of interpretation on 06/28/2022 at 4:26 pm to provider Dr Estell Harpin, who verbally acknowledged these results. Electronically Signed   By: Lesia Hausen M.D.   On: 06/28/2022 16:30     PHYSICAL EXAM  Temp:  [98.5 F (36.9 C)-99 F (37.2 C)] 99 F (37.2 C) (06/09 1249) Pulse Rate:  [78-104] 104 (06/09 1249) Resp:  [  10-19] 16 (06/09 1249) BP: (126-161)/(68-102) 127/72 (06/09 1249) SpO2:  [94 %-100 %] 96 % (06/09 1249) Weight:  [63.5 kg-65.8 kg] 65.8 kg (06/08 2309)  General - Well nourished, well developed, in no apparent distress.  Ophthalmologic - fundi not visualized due to noncooperation.  Cardiovascular - Regular rhythm and rate.  Mental Status -  Level of arousal and orientation to time, place, and person were intact. Language including expression, naming, repetition, comprehension was assessed and found intact. Fund of Knowledge was assessed and was intact.  Cranial Nerves II - XII - II - Visual field intact OU III, IV, VI - Extraocular movements intact. V - Facial sensation intact bilaterally. VII - Facial  movement intact bilaterally. VIII - Hearing & vestibular intact bilaterally. X - Palate elevates symmetrically. XI - Chin turning & shoulder shrug intact bilaterally. XII - Tongue protrusion intact.  Motor Strength - The patient's strength was normal in all extremities and pronator drift was absent.  Bulk was normal and fasciculations were absent.   Motor Tone - Muscle tone was assessed at the neck and appendages and was normal.  Reflexes - The patient's reflexes were symmetrical in all extremities and she had no pathological reflexes.  Sensory - Light touch, temperature/pinprick were assessed and were symmetrical.    Coordination - The patient had normal movements in the hands and feet with no ataxia or dysmetria.  Tremor was absent.  Gait and Station - deferred.   ASSESSMENT/PLAN Ms. JADEE GOLEBIEWSKI is a 52 y.o. female with history of alcohol use disorder, fibromyalgia, panic attacks, hypothyroidism, migraines, HLD, DM, hypothyroidism who was admitted for confusion, not feeling well, left-sided numbness and heaviness in the setting of recently frequent migraine headaches.  No tPA given due to nondisabling symptoms.    Complicated migraine vs. anxiety CT no acute finding CTA head and neck unremarkable MRI  no acute infarct. Probable tiny remote lacunar infarct at the left posterior lentiform nucleus/internal capsule 2D Echo  EF 65-70% LDL 96 HgbA1c 9.1 lovenox for VTE prophylaxis No antithrombotic prior to admission, now on aspirin 81 mg daily. Continue on discharge Patient counseled to be compliant with her antithrombotic medications Ongoing aggressive stroke risk factor management Therapy recommendations:  outpt OT Disposition:  pending  Migraine  4 days a week for the last month She follows with Dr. Delena Bali at [facility] Recently increased topamax to 50 bid Sumatriptan PRN Will need close follow up with Dr. Delena Bali for better migraine control.  Diabetes HgbA1c 9.1 goal <  7.0 Uncontrolled CBG monitoring SSI DM education and close PCP follow up for better DM control  Hypertension Stable Long term BP goal normotensive  Hyperlipidemia Home meds:  crestor 5 for 3 days a week  LDL 96, goal < 100 Now on crestor 5 daily Continue statin at discharge  Other Stroke Risk Factors THC positive, pt denies THC use but her boyfriend smokes THC around her.   Other Active Problems Mild hyponatremia - Na 132  Hospital day # 0  Neurology will sign off. Please call with questions. Pt will follow up with Dr. Delena Bali at [facility] in about 2 weeks. Thanks for the consult.   Marvel Plan, MD PhD Stroke Neurology 06/29/2022 1:23 PM    To contact Stroke Continuity provider, please refer to [email or web address]. After hours, contact General Neurology

## 2022-06-29 NOTE — Evaluation (Signed)
Physical Therapy Evaluation Patient Details Name: [name] MRN: [id number] DOB: [birth year] Today's Date: 06/29/2022  History of Present Illness  Pt presenting 6/8 with dizziness and LUE numbness; having migraines for the past month. MRI with no acute findings. PMH significant for Type I DM, thyroid disease, history of migraines, alcohol use disorder, panic attacks, HLD.   Clinical Impression  Pt presents with condition above and deficits mentioned below, see PT Problem List. 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 Currently, she does display L upper and lower extremity weakness (worse distally) and sensory deficits along with L lower extremity incoordination with testing. However, her symptoms do not appear to affect her functional mobility as she was able to perform all functional mobility without LOB or physical assistance. She was slower and more cautious initially though due to reported anxiety. Pt will likely quickly return to her baseline, thus no PT follow-up likely needed at d/c, but will plan to follow acutely to maximize her return to baseline. Her vestibular assessment was overall negative along with her orthostatics, even though she did report some nausea with positional changes. See below. She does endorse a recent change in her meds after seeing her MD on Friday (6/7) for her more frequent migraines. Her Topomax was increased from 25 mg to 50 mg and she had her first dose of this increased amount around 9 am on Saturday (6/8) and her symptoms began around 2 pm that day. Not sure if this is correlated in any manner towards her symptoms or not, but notified MD.    Vestibular Assessment - 06/29/22 0001       Vestibular Assessment   General Observation Pt did not demonstrate any nystagmus and did not have any reproduction of her symptoms with any vestibular testing. She did get  nauseated with positional changes but her orthostatics were negative. Her vestibular assessment overall appears to be negative.      Symptom Behavior   Subjective history of current problem Pt reports a hx of vertigo that lasted intermittently for x2 hour periods x2 bouts ~2-3 years ago. She did not seek medical attention or treat it then in any way, it just spontaneously resolved itself. Her symptoms yesterday were not described as vertigo but as feeling like she was about to "pass out" and "confused". She does endorse a recent change in her meds after seeing her MD on Friday (6/7) for her more frequent migraines. Her Topomax was increased from 25 mg to 50 mg and she had her first dose of this increased amount around 9 am on Saturday (6/8) and her symptoms began around 2 pm that day.    Type of Dizziness  Lightheadedness;Comment   "about to pass out" "confused"   Symptom Nature Spontaneous    Aggravating Factors Spontaneous onset    Relieving Factors No known relieving factors    Progression of Symptoms Better    History of similar episodes Pt reports a hx of vertigo that lasted intermittently for x2 hour periods x2 bouts ~2-3 years ago. She did not seek medical attention or treat it then in any way, it just spontaneously resolved itself.      Oculomotor Exam   Oculomotor Alignment Normal    Ocular ROM WFL    Spontaneous Absent    Gaze-induced  Absent    Smooth Pursuits Intact    Saccades Intact    Comment  no symptoms with testing      Oculomotor Exam-Fixation Suppressed    Left Head Impulse negative    Right Head Impulse negative      Positional Testing   Dix-Hallpike Dix-Hallpike Right;Dix-Hallpike Left    Horizontal Canal Testing Horizontal Canal Right;Horizontal Canal Left      Dix-Hallpike Right   Dix-Hallpike Right Duration negative    Dix-Hallpike Right Symptoms No nystagmus      Dix-Hallpike Left   Dix-Hallpike Left Duration negative except minor pressure between eyes per pt     Dix-Hallpike Left Symptoms No nystagmus      Horizontal Canal Right   Horizontal Canal Right Duration negative    Horizontal Canal Right Symptoms Normal      Horizontal Canal Left   Horizontal Canal Left Duration negative    Horizontal Canal Left Symptoms Normal      Orthostatics   BP supine (x 5 minutes) 135/82    HR supine (x 5 minutes) 92    BP sitting 136/77    HR sitting 97    BP standing (after 1 minute) 138/93    HR standing (after 1 minute) 112    BP standing (after 3 minutes) 141/94    HR standing (after 3 minutes) 111    Orthostatics Comment Nausea reported with positional changes                  Recommendations for follow up therapy are one component of a multi-disciplinary discharge planning process, led by the attending physician.  Recommendations may be updated based on patient status, additional functional criteria and insurance authorization.  Follow Up Recommendations       Assistance Recommended at Discharge None  Patient can return home with the following       Equipment Recommendations None recommended by PT  Recommendations for Other Services       Functional Status Assessment Patient has had a recent decline in their functional status and demonstrates the ability to make significant improvements in function in a reasonable and predictable amount of time.     Precautions / Restrictions Precautions Precautions: None Restrictions Weight Bearing Restrictions: No      Mobility  Bed Mobility Overal bed mobility: Independent             General bed mobility comments: Bed flat and no rails utilized    Transfers Overall transfer level: Needs assistance Equipment used: None Transfers: Sit to/from Stand Sit to Stand: Supervision           General transfer comment: No assistance needed, supervision for safety due to first time meeting pt but capable of being independent    Ambulation/Gait Ambulation/Gait assistance:  Supervision, Modified independent (Device/Increase time) Gait Distance (Feet): 320 Feet Assistive device: None Gait Pattern/deviations: Step-through pattern, Decreased stride length Gait velocity: reduced Gait velocity interpretation: >2.62 ft/sec, indicative of community ambulatory   General Gait Details: Pt initially cautious with slow cadence, needing encouragement to progress speed and eventual her fluidity improved with distance. No overt LOB, supervision initially but capable of being mod I due to decreased speed.  Stairs Stairs: Yes Stairs assistance: Supervision Stair Management: One rail Right, One rail Left, Alternating pattern, Forwards Number of Stairs: 4 General stair comments: Ascends with R rail and descends with L with reciprocal pattern, no LOB, supervision for safety  Wheelchair Mobility    Modified Rankin (Stroke Patients Only) Modified Rankin (Stroke Patients Only) Pre-Morbid Rankin Score: No symptoms Modified Rankin: No significant disability  Balance Overall balance assessment: No apparent balance deficits (not formally assessed)                                           Pertinent Vitals/Pain Pain Assessment Pain Assessment: 0-10 Pain Score: 6  Pain Location: head Pain Descriptors / Indicators: Headache Pain Intervention(s): Limited activity within patient's tolerance, Monitored during session    Home Living Family/patient expects to be discharged to:: Private residence Living Arrangements: Alone Available Help at Discharge: Family;Available PRN/intermittently Type of Home: Apartment Home Access: Stairs to enter Entrance Stairs-Rails: Right (ascending) Entrance Stairs-Number of Steps: 15   Home Layout: One level Home Equipment: None      Prior Function Prior Level of Function : Independent/Modified Independent;Driving;Working/employed             Mobility Comments: No AD ADLs Comments: Works as Scientist, physiological at a [employer]   Dominant Hand: Right    Extremity/Trunk Assessment   Upper Extremity Assessment Upper Extremity Assessment: LUE deficits/detail;Defer to OT evaluation LUE Deficits / Details: weakness throughout (more distally than proximally); slight numbness throughout    Lower Extremity Assessment Lower Extremity Assessment: LLE deficits/detail LLE Deficits / Details: Weakness throughout (more distally than proximally) with MMT scores of 4 hip flexion, 4- knee extension, 3+ ankle dorsiflexion (4+ to 5 on R for comparison); mild  numbness at toes; incoordination noted LLE Sensation: decreased light touch LLE Coordination: decreased gross motor;decreased fine motor    Cervical / Trunk Assessment Cervical / Trunk Assessment: Normal  Communication   Communication: No difficulties  Cognition Arousal/Alertness: Awake/alert Behavior During Therapy: WFL for tasks assessed/performed Overall Cognitive Status: Within Functional Limits for tasks assessed                                          General Comments General comments (skin integrity, edema, etc.): encouraged pt to reach out to PCP if symptoms persist at d/c    Exercises     Assessment/Plan    PT Assessment Patient needs continued PT services  PT Problem List Decreased strength;Decreased coordination;Impaired sensation       PT Treatment Interventions DME instruction;Gait training;Stair training;Functional mobility training;Therapeutic activities;Therapeutic exercise;Balance training;Neuromuscular re-education;Patient/family education    PT Goals (Current goals can be found in the Care Plan section)  Acute Rehab PT Goals Patient Stated Goal: to get back to baseline PT Goal Formulation: With patient Time For Goal Achievement: 07/13/22 Potential to Achieve Goals: Good    Frequency Min 3X/week     Co-evaluation               AM-PAC PT "6 Clicks" Mobility  Outcome Measure  Help needed turning from your back to your side while in a flat bed without using bedrails?: None Help needed moving from lying on your back to sitting on the side of a flat bed without using bedrails?: None Help needed moving to and from a bed to a chair (including a wheelchair)?: A Little Help needed standing up from a chair using your arms (e.g., wheelchair or bedside chair)?: A Little Help needed to walk in hospital room?: A Little Help needed climbing 3-5 steps with a railing? : A Little 6 Click Score: 20    End of Session Equipment  Utilized During Treatment: Gait belt Activity Tolerance: Patient tolerated treatment well Patient left: Other (comment) (walking with OT) Nurse Communication: Mobility status PT Visit Diagnosis: Muscle weakness (generalized) (M62.81)    Time: 1610-9604 PT Time Calculation (min) (ACUTE ONLY): 37 min   Charges:   PT Evaluation $PT Eval Moderate Complexity: 1 Mod PT Treatments $Therapeutic Activity: 8-22 mins        Raymond Gurney, PT, DPT Acute Rehabilitation Services  Office: [phone]   Jewel Baize 06/29/2022, 8:39 AM

## 2022-06-29 NOTE — Plan of Care (Signed)
Problem: Education: Goal: Knowledge of General Education information will improve Description: Including pain rating scale, medication(s)/side effects and non-pharmacologic comfort measures Outcome: Adequate for Discharge   Problem: Health Behavior/Discharge Planning: Goal: Ability to manage health-related needs will improve Outcome: Adequate for Discharge   Problem: Clinical Measurements: Goal: Ability to maintain clinical measurements within normal limits will improve Outcome: Adequate for Discharge Goal: Will remain free from infection Outcome: Adequate for Discharge Goal: Diagnostic test results will improve Outcome: Adequate for Discharge Goal: Respiratory complications will improve Outcome: Adequate for Discharge Goal: Cardiovascular complication will be avoided Outcome: Adequate for Discharge   Problem: Activity: Goal: Risk for activity intolerance will decrease Outcome: Adequate for Discharge   Problem: Nutrition: Goal: Adequate nutrition will be maintained Outcome: Adequate for Discharge   Problem: Coping: Goal: Level of anxiety will decrease Outcome: Adequate for Discharge   Problem: Elimination: Goal: Will not experience complications related to bowel motility Outcome: Adequate for Discharge Goal: Will not experience complications related to urinary retention Outcome: Adequate for Discharge   Problem: Pain Managment: Goal: General experience of comfort will improve Outcome: Adequate for Discharge   Problem: Safety: Goal: Ability to remain free from injury will improve Outcome: Adequate for Discharge   Problem: Skin Integrity: Goal: Risk for impaired skin integrity will decrease Outcome: Adequate for Discharge   Problem: Education: Goal: Knowledge of disease or condition will improve Outcome: Adequate for Discharge Goal: Knowledge of secondary prevention will improve (MUST DOCUMENT ALL) Outcome: Adequate for Discharge Goal: Knowledge of patient  specific risk factors will improve (Mark N/A or DELETE if not current risk factor) Outcome: Adequate for Discharge   Problem: Ischemic Stroke/TIA Tissue Perfusion: Goal: Complications of ischemic stroke/TIA will be minimized Outcome: Adequate for Discharge   Problem: Coping: Goal: Will verbalize positive feelings about self Outcome: Adequate for Discharge Goal: Will identify appropriate support needs Outcome: Adequate for Discharge   Problem: Health Behavior/Discharge Planning: Goal: Ability to manage health-related needs will improve Outcome: Adequate for Discharge Goal: Goals will be collaboratively established with patient/family Outcome: Adequate for Discharge   Problem: Self-Care: Goal: Ability to participate in self-care as condition permits will improve Outcome: Adequate for Discharge Goal: Verbalization of feelings and concerns over difficulty with self-care will improve Outcome: Adequate for Discharge Goal: Ability to communicate needs accurately will improve Outcome: Adequate for Discharge   Problem: Nutrition: Goal: Risk of aspiration will decrease Outcome: Adequate for Discharge Goal: Dietary intake will improve Outcome: Adequate for Discharge   Problem: Education: Goal: Ability to describe self-care measures that may prevent or decrease complications (Diabetes Survival Skills Education) will improve Outcome: Adequate for Discharge Goal: Individualized Educational Video(s) Outcome: Adequate for Discharge   Problem: Coping: Goal: Ability to adjust to condition or change in health will improve Outcome: Adequate for Discharge   Problem: Fluid Volume: Goal: Ability to maintain a balanced intake and output will improve Outcome: Adequate for Discharge   Problem: Health Behavior/Discharge Planning: Goal: Ability to identify and utilize available resources and services will improve Outcome: Adequate for Discharge Goal: Ability to manage health-related needs will  improve Outcome: Adequate for Discharge   Problem: Metabolic: Goal: Ability to maintain appropriate glucose levels will improve Outcome: Adequate for Discharge   Problem: Nutritional: Goal: Maintenance of adequate nutrition will improve Outcome: Adequate for Discharge Goal: Progress toward achieving an optimal weight will improve Outcome: Adequate for Discharge   Problem: Skin Integrity: Goal: Risk for impaired skin integrity will decrease Outcome: Adequate for Discharge   Problem: Tissue Perfusion: Goal: Adequacy   of tissue perfusion will improve Outcome: Adequate for Discharge   

## 2022-06-29 NOTE — Discharge Summary (Signed)
[facility]  Physician Discharge Summary   Patient ID: [name] MRN: [id number] DOB/AGE: [birth year] 54 y.o.  Admit date: 06/28/2022 Discharge date: 06/29/2022    PCP: [name], MD  DISCHARGE DIAGNOSES:  Transient focal neurological symptoms likely secondary to complex migraine   Fibromyalgia affecting multiple sites   Adjustment disorder with mixed anxiety and depressed mood   Hyperglycemia due to type 1 diabetes mellitus (HCC)   Alcohol use disorder   RECOMMENDATIONS FOR OUTPATIENT FOLLOW UP: Patient instructed to follow-up with her primary care provider with her outpatient neurologist   Home Health: Outpatient occupational therapy Equipment/Devices: None  CODE STATUS: Full code  DISCHARGE CONDITION: fair  Diet recommendation: As before  INITIAL HISTORY:  52 y.o. female, with past medical history of alcohol use disorder, fibromyalgia, panic attacks, hypothyroidism, type 1 diabetes mellitus, on insulin pump, migraines, hyperlipidemia.  Patient with history of migraine headaches and her Topamax dose was recently increased to 50 mg twice a day.  She experienced some confusion left-sided heaviness tingling and numbness.  She felt like she was get a pass out.  EMS was called patient was brought into the emergency department as a code stroke.  Subsequently transferred to [facility] for further management.  Not given TNK since she had nondisabling symptoms.   Consultants: Neurology   Procedures: Echocardiogram   HOSPITAL COURSE:   Focal neurological symptoms secondary to complex migraine MRI without any acute stroke. Patient underwent CT angiogram head and neck.  Results deferred to neurology. Echocardiogram without any significant abnormalities.  Normal left ventricular systolic function is noted. LDL is 96.  Patient noted to be on statin 3 days a week.  She was asked to take rosuvastatin on a daily basis going forward.   HbA1c is  9.1. Continue aspirin   History of migraine headaches Her Topamax dose was recently increased.   Hypothyroidism Continue with levothyroxine.   Type 1 diabetes mellitus HbA1c 9.1.  Continue with insulin pump   Hyperlipidemia LDL 96.  Crestor changed to daily from 3 times a week.   Fibromyalgia/adjustment disorder with mixed anxiety and depressed mood Continue Cymbalta.   Alcohol use disorder Counseled.  No evidence for withdrawal during this hospital stay.  Patient is stable.  Okay for discharge home today.  Cleared by neurology.   PERTINENT LABS:  The results of significant diagnostics from this hospitalization (including imaging, microbiology, ancillary and laboratory) are listed below for reference.    Labs:   Basic Metabolic Panel: Recent Labs  Lab 06/28/22 1502  NA 132*  K 4.2  CL 103  CO2 22  GLUCOSE 221*  BUN 11  CREATININE 0.78  CALCIUM 8.5*   Liver Function Tests: Recent Labs  Lab 06/28/22 1502  AST 16  ALT 17  ALKPHOS 97  BILITOT 0.8  PROT 6.1*  ALBUMIN 3.4*   CBC: Recent Labs  Lab 06/28/22 1502  WBC 7.0  NEUTROABS 4.2  HGB 13.5  HCT 39.9  MCV 91.5  PLT 155    CBG: Recent Labs  Lab 06/28/22 1438 06/28/22 2329 06/29/22 0322 06/29/22 0737 06/29/22 1251  GLUCAP 230* 293* 111* 217* 313*     IMAGING STUDIES ECHOCARDIOGRAM COMPLETE  Result Date: 06/29/2022    ECHOCARDIOGRAM REPORT   Patient Name:   [name] Date of Exam: 06/29/2022 Medical Rec #:  [id number]           Height:       62.0 in Accession #:    [id number]  Weight:       145.1 lb Date of Birth:  1971-01-17           BSA:          1.668 m Patient Age:    52 years            BP:           146/84 mmHg Patient Gender: F                   HR:           84 bpm. Exam Location:  Inpatient Procedure: 2D Echo, Cardiac Doppler and Color Doppler Indications:    Stroke I63.9  History:        Patient has no prior history of Echocardiogram examinations.                  Risk Factors:Dyslipidemia, Diabetes and Non-Smoker.  Sonographer:    Dondra Prader RVT RCS Referring Phys: [id number] DAWO[address] S ELGER[place] IMPRESSIONS  1. Left ventricular ejection fraction, by estimation, is 65 to 70%. The left ventricle has normal function. The left ventricle has no regional wall motion abnormalities. Left ventricular diastolic parameters were normal.  2. Right ventricular systolic function is normal. The right ventricular size is normal. Tricuspid regurgitation signal is inadequate for assessing PA pressure.  3. The mitral valve is grossly normal. Trivial mitral valve regurgitation. No evidence of mitral stenosis.  4. The aortic valve was not well visualized. Aortic valve regurgitation is not visualized. No aortic stenosis is present.  5. The inferior vena cava is normal in size with greater than 50% respiratory variability, suggesting right atrial pressure of 3 mmHg. Conclusion(s)/Recommendation(s): No intracardiac source of embolism detected on this transthoracic study. Consider a transesophageal echocardiogram to exclude cardiac source of embolism if clinically indicated. FINDINGS  Left Ventricle: Left ventricular ejection fraction, by estimation, is 65 to 70%. The left ventricle has normal function. The left ventricle has no regional wall motion abnormalities. The left ventricular internal cavity size was normal in size. There is  no left ventricular hypertrophy. Left ventricular diastolic parameters were normal. Right Ventricle: The right ventricular size is normal. No increase in right ventricular wall thickness. Right ventricular systolic function is normal. Tricuspid regurgitation signal is inadequate for assessing PA pressure. Left Atrium: Left atrial size was normal in size. Right Atrium: Right atrial size was normal in size. Pericardium: There is no evidence of pericardial effusion. Presence of epicardial fat layer. Mitral Valve: The mitral valve is grossly normal. Trivial mitral valve  regurgitation. No evidence of mitral valve stenosis. Tricuspid Valve: The tricuspid valve is grossly normal. Tricuspid valve regurgitation is trivial. No evidence of tricuspid stenosis. Aortic Valve: The aortic valve was not well visualized. Aortic valve regurgitation is not visualized. No aortic stenosis is present. Aortic valve mean gradient measures 3.0 mmHg. Aortic valve peak gradient measures 5.7 mmHg. Aortic valve area, by VTI measures 1.79 cm. Pulmonic Valve: The pulmonic valve was grossly normal. Pulmonic valve regurgitation is not visualized. No evidence of pulmonic stenosis. Aorta: The aortic root and ascending aorta are structurally normal, with no evidence of dilitation. Venous: The inferior vena cava is normal in size with greater than 50% respiratory variability, suggesting right atrial pressure of 3 mmHg. IAS/Shunts: The atrial septum is grossly normal.  LEFT VENTRICLE PLAX 2D LVIDd:         4.10 cm   Diastology LVIDs:         2.50 cm  LV e' medial:    9.14 cm/s LV PW:         1.20 cm   LV E/e' medial:  6.0 LV IVS:        0.90 cm   LV e' lateral:   12.30 cm/s LVOT diam:     1.90 cm   LV E/e' lateral: 4.4 LV SV:         35 LV SV Index:   21 LVOT Area:     2.84 cm  RIGHT VENTRICLE             IVC RV Basal diam:  2.40 cm     IVC diam: 1.70 cm RV Mid diam:    2.10 cm RV S prime:     11.50 cm/s TAPSE (M-mode): 1.9 cm LEFT ATRIUM             Index        RIGHT ATRIUM          Index LA diam:        3.10 cm 1.86 cm/m   RA Area:     6.78 cm LA Vol (A2C):   16.5 ml 9.89 ml/m   RA Volume:   11.30 ml 6.78 ml/m LA Vol (A4C):   18.0 ml 10.79 ml/m LA Biplane Vol: 17.9 ml 10.73 ml/m  AORTIC VALVE                    PULMONIC VALVE AV Area (Vmax):    1.87 cm     PV Vmax:       0.85 m/s AV Area (Vmean):   1.87 cm     PV Peak grad:  2.9 mmHg AV Area (VTI):     1.79 cm AV Vmax:           119.00 cm/s AV Vmean:          79.800 cm/s AV VTI:            0.195 m AV Peak Grad:      5.7 mmHg AV Mean Grad:      3.0  mmHg LVOT Vmax:         78.40 cm/s LVOT Vmean:        52.500 cm/s LVOT VTI:          0.123 m LVOT/AV VTI ratio: 0.63  AORTA Ao Root diam: 3.20 cm Ao Asc diam:  2.80 cm MITRAL VALVE MV Area (PHT): 3.37 cm    SHUNTS MV Decel Time: 225 msec    Systemic VTI:  0.12 m MV E velocity: 54.60 cm/s  Systemic Diam: 1.90 cm MV A velocity: 56.60 cm/s MV E/A ratio:  0.96 Lennie Odor MD Electronically signed by Lennie Odor MD Signature Date/Time: 06/29/2022/11:18:24 AM    Final    MR BRAIN WO CONTRAST  Result Date: 06/29/2022 CLINICAL DATA:  Follow-up examination for stroke. EXAM: MRI HEAD WITHOUT CONTRAST TECHNIQUE: Multiplanar, multiecho pulse sequences of the brain and surrounding structures were obtained without intravenous contrast. COMPARISON:  Prior studies from 06/28/2022 and earlier. FINDINGS: Brain: Cerebral volume within normal limits. No significant cerebral white matter disease for age. Probable tiny remote lacunar infarct at the left posterior lentiform nucleus/in paternal capsule (series 6, image 18). No evidence for acute or subacute ischemia. Gray-white matter differentiation maintained. No acute or chronic intracranial or products. Mineralization involving the thalami noted, stable from prior. No mass lesion, midline shift or mass effect. No hydrocephalus or extra-axial fluid collection. Pituitary gland and  suprasellar region within normal limits. Vascular: Major intracranial vascular flow voids are maintained. Skull and upper cervical spine: Craniocervical junction normal. Bone marrow signal intensity within normal limits. No scalp soft tissue abnormality. Sinuses/Orbits: Prior ocular lens replacement on the right. Paranasal sinuses are clear. No mastoid effusion. Other: None. IMPRESSION: 1. No acute intracranial abnormality. 2. Probable tiny remote lacunar infarct at the left posterior lentiform nucleus/internal capsule 3. Otherwise normal brain MRI for age. Electronically Signed   By: Rise Mu  M.D.   On: 06/29/2022 01:38   CT ANGIO HEAD NECK W WO CM (CODE STROKE)  Result Date: 06/28/2022 CLINICAL DATA:  Neuro deficit, acute, stroke suspected. EXAM: CT ANGIOGRAPHY HEAD AND NECK WITH AND WITHOUT CONTRAST TECHNIQUE: Multidetector CT imaging of the head and neck was performed using the standard protocol during bolus administration of intravenous contrast. Multiplanar CT image reconstructions and MIPs were obtained to evaluate the vascular anatomy. Carotid stenosis measurements (when applicable) are obtained utilizing NASCET criteria, using the distal internal carotid diameter as the denominator. RADIATION DOSE REDUCTION: This exam was performed according to the departmental dose-optimization program which includes automated exposure control, adjustment of the mA and/or kV according to patient size and/or use of iterative reconstruction technique. CONTRAST:  75mL OMNIPAQUE IOHEXOL 350 MG/ML SOLN COMPARISON:  Head CT 06/28/2022.  MRI brain 09/11/2021. FINDINGS: CTA NECK FINDINGS Aortic arch: Three-vessel arch configuration. Arch vessel origins are patent. Right carotid system: No evidence of dissection, stenosis (50% or greater), or occlusion. Left carotid system: No evidence of dissection, stenosis (50% or greater), or occlusion. Vertebral arteries: Codominant. No evidence of dissection, stenosis (50% or greater), or occlusion. Skeleton: Mild cervical spondylosis without high-grade spinal canal stenosis. Other neck: Unremarkable. Upper chest: Unremarkable. Review of the MIP images confirms the above findings CTA HEAD FINDINGS Anterior circulation: Intracranial ICAs are patent without stenosis or aneurysm. The proximal ACAs and MCAs are patent without stenosis or aneurysm. Distal branches are symmetric. Posterior circulation: Normal basilar artery. The SCAs, AICAs and PICAs are patent proximally. The PCAs are patent proximally without stenosis or aneurysm. Distal branches are symmetric. Venous sinuses:  Patent. Anatomic variants: None. Review of the MIP images confirms the above findings IMPRESSION: No large vessel occlusion, stenosis, dissection, or aneurysm in the head or neck vessels. Electronically Signed   By: Orvan Falconer M.D.   On: 06/28/2022 18:00   CT HEAD CODE STROKE WO CONTRAST`  Result Date: 06/28/2022 CLINICAL DATA:  Code stroke. Confusion, weakness, slurred speech, dizziness. EXAM: CT HEAD WITHOUT CONTRAST TECHNIQUE: Contiguous axial images were obtained from the base of the skull through the vertex without intravenous contrast. RADIATION DOSE REDUCTION: This exam was performed according to the departmental dose-optimization program which includes automated exposure control, adjustment of the mA and/or kV according to patient size and/or use of iterative reconstruction technique. COMPARISON:  Brain MRI 09/11/2021 FINDINGS: Brain: There is no acute intracranial hemorrhage, extra-axial fluid collection, or acute infarct. Parenchymal volume is normal. The ventricles are normal in size. Gray-white differentiation is preserved. There is asymmetric mineralization in the thalami, right more than left. This is unchanged compared to the brain MRI from 2023. The pituitary and suprasellar region are normal. There is no mass lesion. There is no mass effect or midline shift. Vascular: No hyperdense vessel or unexpected calcification. Skull: Normal. Negative for fracture or focal lesion. Sinuses/Orbits: The imaged paranasal sinuses are clear. The mastoid air cells and middle ear cavities are clear. A right lens implant is noted. The globes and orbits are  otherwise unremarkable. Other: None. ASPECTS [facility] Stroke Program Early CT Score) - Ganglionic level infarction (caudate, lentiform nuclei, internal capsule, insula, M1-M3 cortex): 7 - Supraganglionic infarction (M4-M6 cortex): 3 Total score (0-10 with 10 being normal): 10 IMPRESSION: No acute intracranial pathology. These results were called by telephone  at the time of interpretation on 06/28/2022 at 4:26 pm to provider Dr Estell Harpin, who verbally acknowledged these results. Electronically Signed   By: Lesia Hausen M.D.   On: 06/28/2022 16:30    DISCHARGE EXAMINATION: Vitals:   06/29/22 0320 06/29/22 0730 06/29/22 0830 06/29/22 1249  BP: 130/68 (!) 146/84  127/72  Pulse: 78 95  (!) 104  Resp: 17 16  16   Temp: 98.7 F (37.1 C) 98.5 F (36.9 C)  99 F (37.2 C)  TempSrc: Oral Oral  Oral  SpO2: 98% 96% 100% 96%  Weight:      Height:       General appearance: Awake alert.  In no distress Resp: Clear to auscultation bilaterally.  Normal effort Cardio: S1-S2 is normal regular.  No S3-S4.  No rubs murmurs or bruit GI: Abdomen is soft.  Nontender nondistended.  Bowel sounds are present normal.  No masses organomegaly   DISPOSITION: Home  Discharge Instructions     Ambulatory referral to Neurology   Complete by: As directed    Follow up with Dr. Delena Bali at [facility] in 2 weeks. Pt is Dr. Quentin Mulling pt. Thanks.   Call MD for:  difficulty breathing, headache or visual disturbances   Complete by: As directed    Call MD for:  extreme fatigue   Complete by: As directed    Call MD for:  persistant dizziness or light-headedness   Complete by: As directed    Call MD for:  persistant nausea and vomiting   Complete by: As directed    Call MD for:  severe uncontrolled pain   Complete by: As directed    Call MD for:  temperature >100.4   Complete by: As directed    Diet - low sodium heart healthy   Complete by: As directed    Diet Carb Modified   Complete by: As directed    Discharge instructions   Complete by: As directed    Please be sure to follow up with your PCP and neurologist.  You were cared for by a hospitalist during your hospital stay. If you have any questions about your discharge medications or the care you received while you were in the hospital after you are discharged, you can call the unit and asked to speak with the hospitalist on call  if the hospitalist that took care of you is not available. Once you are discharged, your primary care physician will handle any further medical issues. Please note that NO REFILLS for any discharge medications will be authorized once you are discharged, as it is imperative that you return to your primary care physician (or establish a relationship with a primary care physician if you do not have one) for your aftercare needs so that they can reassess your need for medications and monitor your lab values. If you do not have a primary care physician, you can call [phone] for a physician referral.   Increase activity slowly   Complete by: As directed          Allergies as of 06/29/2022   No Known Allergies      Medication List     TAKE these medications    aspirin EC 81  MG tablet Take 1 tablet (81 mg total) by mouth daily. Swallow whole.   cholecalciferol 25 MCG (1000 UNIT) tablet Commonly known as: VITAMIN D3 Take 1,000 Units by mouth daily.   diclofenac 75 MG EC tablet Commonly known as: VOLTAREN TAKE (1) TABLET BY MOUTH TWICE DAILY. What changed: See the new instructions.   DULoxetine 60 MG capsule Commonly known as: Cymbalta Take 1 capsule (60 mg total) by mouth daily.   insulin aspart 100 UNIT/ML injection Commonly known as: novoLOG by Pump Prime route continuous.   levothyroxine 75 MCG tablet Commonly known as: SYNTHROID Take 75 mcg by mouth daily.   rosuvastatin 5 MG tablet Commonly known as: CRESTOR Take 1 tablet (5 mg total) by mouth daily. What changed: when to take this   SUMAtriptan 50 MG tablet Commonly known as: Imitrex Take 1 tablet (50 mg total) by mouth every 2 (two) hours as needed for migraine. May repeat in 2 hours if headache persists or recurs.   topiramate 50 MG tablet Commonly known as: Topamax Take 1 tablet (50 mg total) by mouth 2 (two) times daily.   Vitamin D (Ergocalciferol) 1.25 MG (50000 UNIT) Caps capsule Commonly known as:  DRISDOL Take 50,000 Units by mouth every Wednesday.          Follow-up Information     Ocie Doyne, MD. Schedule an appointment as soon as possible for a visit in 2 week(s).   Specialty: Neurology Contact information: [address] [place] [address] [phone]         [name], MD. Schedule an appointment as soon as possible for a visit in 1 week(s).   Specialty: Internal Medicine Why: post hospitalization follow up Contact information: [address] [place] [address] [phone]         [facility] Follow up.   Specialty: Rehabilitation Why: they will call in the next week to schedule occupational therapy Contact information: [address] [phone] [place] [address] [phone]                TOTAL DISCHARGE TIME: 35 minutes  [place] Rito Ehrlich  [facility] Pager on [email or web address]  06/30/2022, 12:46 PM

## 2022-06-29 NOTE — Evaluation (Signed)
Occupational Therapy Evaluation Patient Details Name: [name] MRN: [id number] DOB: [birth year] Today's Date: 06/29/2022   History of Present Illness Pt presenting 6/8 with dizziness and LUE numbness; having migraines for the past month. MRI with no acute findings. PMH significant for Type I DM, thyroid disease, history of migraines, alcohol use disorder, panic attacks, HLD.   Clinical Impression   PTA, pt was independent and working. Upon eval, pt presents with slowed coordination, LUE numbness, decr problem solving, and memory. Pt performing ADL with up to supervision. Pt scoring a 4 on the short blessed test with mild memory impairment. Pt reporting memory affects work tasks, so OT providing keyboard and simulating work like task; pt with significantly impaired processing speed and needing cues for memory. Recommending OP OT to optimize ADL and IADL.      Recommendations for follow up therapy are one component of a multi-disciplinary discharge planning process, led by the attending physician.  Recommendations may be updated based on patient status, additional functional criteria and insurance authorization.   Assistance Recommended at Discharge Intermittent Supervision/Assistance  Patient can return home with the following Help with stairs or ramp for entrance;A little help with bathing/dressing/bathroom;A little help with walking and/or transfers    Functional Status Assessment  Patient has had a recent decline in their functional status and demonstrates the ability to make significant improvements in function in a reasonable and predictable amount of time.  Equipment Recommendations  None recommended by OT    Recommendations for Other Services       Precautions / Restrictions Precautions Precautions: None Restrictions Weight Bearing Restrictions: No      Mobility Bed Mobility Overal bed mobility: Independent             General bed mobility comments: Bed flat  and no rails utilized    Transfers Overall transfer level: Needs assistance Equipment used: None Transfers: Sit to/from Stand Sit to Stand: Supervision           General transfer comment: No assistance needed, supervision for safety due to first time meeting pt but capable of being independent      Balance Overall balance assessment: No apparent balance deficits (not formally assessed)                                         ADL either performed or assessed with clinical judgement   ADL Overall ADL's : Needs assistance/impaired Eating/Feeding: Modified independent   Grooming: Modified independent   Upper Body Bathing: Modified independent   Lower Body Bathing: Modified independent   Upper Body Dressing : Modified independent   Lower Body Dressing: Modified independent   Toilet Transfer: Supervision/safety   Toileting- Clothing Manipulation and Hygiene: Supervision/safety   Tub/ Shower Transfer: Tub transfer;Supervision/safety;Ambulation   Functional mobility during ADLs: Supervision/safety General ADL Comments: Pt with slowed rate of coordination. Having pt perform typing to simulate work related task and with slowed coordiantion affecting productivity and job duties     Vision Baseline Vision/History: 1 Wears glasses;0 No visual deficits Ability to See in Adequate Light: 0 Adequate Patient Visual Report: No change from baseline Vision Assessment?: No apparent visual deficits     Perception     Praxis      Pertinent Vitals/Pain Pain Assessment Pain Location: head Pain Descriptors / Indicators: Headache     Hand Dominance Right   Extremity/Trunk Assessment Upper Extremity Assessment  Upper Extremity Assessment: Defer to OT evaluation   Lower Extremity Assessment Lower Extremity Assessment: Defer to PT evaluation   Cervical / Trunk Assessment Cervical / Trunk Assessment: Normal   Communication Communication Communication: No  difficulties   Cognition Arousal/Alertness: Awake/alert Behavior During Therapy: WFL for tasks assessed/performed Overall Cognitive Status: Impaired/Different from baseline Area of Impairment: Problem solving, Memory                     Memory: Decreased short-term memory       Problem Solving: Slow processing General Comments: increased time for all portions of short blessed test. pt reporting that she has had memory dififculties for 1+ years now and uses compensatory strategies, but does not seem like compensatory strategies she is using are fully effective. Scored a 4 on the short blessed test     General Comments  Pt reporting difficulty with effective use of compensatory technqiues for memory; additionally perofrming simulated work-like task and with significantly slowed processing. Will continue to evaluate, but for now recommending OP OT    Exercises     Shoulder Instructions      Home Living Family/patient expects to be discharged to:: Private residence Living Arrangements: Alone Available Help at Discharge: Family;Available PRN/intermittently Type of Home: Apartment Home Access: Stairs to enter Entrance Stairs-Number of Steps: 15 Entrance Stairs-Rails: Right (ascending) Home Layout: One level     Bathroom Shower/Tub: Chief Strategy Officer: Handicapped height     Home Equipment: None          Prior Functioning/Environment Prior Level of Function : Independent/Modified Independent;Driving;Working/employed             Mobility Comments: No AD ADLs Comments: Works as Scientist, physiological at a [employer] Problem List: Decreased strength;Impaired balance (sitting and/or standing);Decreased activity tolerance;Impaired UE functional use      OT Treatment/Interventions: Self-care/ADL training;Therapeutic exercise;DME and/or AE instruction;Balance training;Patient/family education;Therapeutic activities    OT Goals(Current goals can  be found in the care plan section) Acute Rehab OT Goals Patient Stated Goal: get home OT Goal Formulation: With patient Time For Goal Achievement: 07/13/22 Potential to Achieve Goals: Good  OT Frequency: Min 2X/week    Co-evaluation              AM-PAC OT "6 Clicks" Daily Activity     Outcome Measure Help from another person eating meals?: None Help from another person taking care of personal grooming?: A Little Help from another person toileting, which includes using toliet, bedpan, or urinal?: A Little Help from another person bathing (including washing, rinsing, drying)?: A Little Help from another person to put on and taking off regular upper body clothing?: A Little Help from another person to put on and taking off regular lower body clothing?: A Little 6 Click Score: 19   End of Session Equipment Utilized During Treatment: Gait belt Nurse Communication: Mobility status  Activity Tolerance: Patient tolerated treatment well Patient left: in bed;with call bell/phone within reach  OT Visit Diagnosis: Unsteadiness on feet (R26.81);Muscle weakness (generalized) (M62.81);Other symptoms and signs involving cognitive function                Time: 1610-9604 OT Time Calculation (min): 22 min Charges:  OT General Charges $OT Visit: 1 Visit OT Evaluation $OT Eval Low Complexity: 1 Low  Tyler Deis, OTR/L [facility] Acute Rehabilitation Office: [phone]   Myrla Halsted 06/29/2022, 12:15 PM

## 2022-06-29 NOTE — TOC Transition Note (Signed)
Transition of Care [facility]) - CM/SW Discharge Note   Patient Details  Name: [name] MRN: [id number] Date of Birth: March 10, 1970  Transition of [facility]) CM/SW Contact:  Lawerance Sabal, RN Phone Number: 06/29/2022, 1:53 PM   Clinical Narrative:     Sherron Monday w pt over the phone. Discussed recommendations for OP OT follow up.  Patient agreeable to referral. Referral made to [facility] Neuro. Patient understands they should call within a week to schedule an appointment.    Final next level of care: Home/Self Care Barriers to Discharge: No Barriers Identified   Patient Goals and CMS Choice      Discharge Placement                         Discharge Plan and Services Additional resources added to the After Visit Summary for                                       Social Determinants of Health (SDOH) Interventions SDOH Screenings   Food Insecurity: Food Insecurity Present (06/26/2022)  Housing: Low Risk  (06/26/2022)  Transportation Needs: No Transportation Needs (06/26/2022)  Alcohol Screen: Low Risk  (06/26/2022)  Depression (PHQ2-9): Low Risk  (06/27/2022)  Financial Resource Strain: Low Risk  (06/26/2022)  Physical Activity: Insufficiently Active (06/26/2022)  Social Connections: Socially Isolated (06/26/2022)  Stress: No Stress Concern Present (06/26/2022)  Tobacco Use: Low Risk  (06/28/2022)     Readmission Risk Interventions     No data to display

## 2022-06-30 ENCOUNTER — Ambulatory Visit: Payer: BC Managed Care – PPO | Admitting: Internal Medicine

## 2022-06-30 ENCOUNTER — Encounter: Payer: Self-pay | Admitting: Internal Medicine
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
## 2022-07-08 ENCOUNTER — Telehealth: Payer: Self-pay | Admitting: Occupational Therapy

## 2022-07-08 ENCOUNTER — Ambulatory Visit: Payer: BC Managed Care – PPO | Attending: Neurology | Admitting: Occupational Therapy

## 2022-07-08 ENCOUNTER — Encounter: Payer: Self-pay | Admitting: Occupational Therapy

## 2022-07-08 ENCOUNTER — Other Ambulatory Visit: Payer: Self-pay | Admitting: Neurology

## 2022-07-08 DIAGNOSIS — M6281 Muscle weakness (generalized): Secondary | ICD-10-CM | POA: Diagnosis not present

## 2022-07-08 DIAGNOSIS — R41840 Attention and concentration deficit: Secondary | ICD-10-CM | POA: Insufficient documentation

## 2022-07-08 DIAGNOSIS — R41844 Frontal lobe and executive function deficit: Secondary | ICD-10-CM | POA: Diagnosis not present

## 2022-07-08 DIAGNOSIS — I639 Cerebral infarction, unspecified: Secondary | ICD-10-CM

## 2022-07-08 NOTE — Telephone Encounter (Signed)
Hello Dr. Roda Shutters,   I evaluated [facility] today for O.T. She also complained of dizziness and decreased balance. I think she would benefit from P.T. referral. If you agree, please send P.T. referral via [facility] to [facility].   Thank you,  Jene Every, OTR/L

## 2022-07-14 ENCOUNTER — Encounter: Payer: BC Managed Care – PPO | Admitting: Occupational Therapy

## 2022-07-16 ENCOUNTER — Ambulatory Visit: Payer: BC Managed Care – PPO | Admitting: Occupational Therapy

## 2022-07-21 ENCOUNTER — Ambulatory Visit: Payer: BC Managed Care – PPO | Admitting: Occupational Therapy

## 2022-07-21 NOTE — Telephone Encounter (Signed)
Called and left message for patient re: 3 O.T. cancellations. Asked pt to call us back and let us know whether she plans on coming to next appointment or if she wishes to d/c. Did caution if we have 1 more cancellation or no show, we will have to d/c per our policy.

## 2022-07-30 ENCOUNTER — Encounter: Payer: BC Managed Care – PPO | Admitting: Occupational Therapy

## 2022-08-04 ENCOUNTER — Encounter: Payer: BC Managed Care – PPO | Admitting: Occupational Therapy

## 2022-08-04 ENCOUNTER — Ambulatory Visit: Payer: BC Managed Care – PPO | Attending: Neurology | Admitting: Physical Therapy
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
## 2022-08-05 ENCOUNTER — Other Ambulatory Visit: Payer: Self-pay | Admitting: Internal Medicine

## 2022-08-05 ENCOUNTER — Telehealth: Payer: Self-pay | Admitting: *Deleted

## 2022-08-05 ENCOUNTER — Ambulatory Visit: Payer: BC Managed Care – PPO | Admitting: Internal Medicine

## 2022-08-05 ENCOUNTER — Other Ambulatory Visit: Payer: Self-pay | Admitting: Obstetrics & Gynecology

## 2022-08-05 DIAGNOSIS — F410 Panic disorder [episodic paroxysmal anxiety] without agoraphobia: Secondary | ICD-10-CM

## 2022-08-05 DIAGNOSIS — E039 Hypothyroidism, unspecified: Secondary | ICD-10-CM | POA: Diagnosis not present

## 2022-08-05 DIAGNOSIS — E1065 Type 1 diabetes mellitus with hyperglycemia: Secondary | ICD-10-CM | POA: Diagnosis not present

## 2022-08-05 DIAGNOSIS — Z9641 Presence of insulin pump (external) (internal): Secondary | ICD-10-CM | POA: Diagnosis not present

## 2022-08-05 DIAGNOSIS — E7800 Pure hypercholesterolemia, unspecified: Secondary | ICD-10-CM | POA: Diagnosis not present

## 2022-08-05 DIAGNOSIS — N938 Other specified abnormal uterine and vaginal bleeding: Secondary | ICD-10-CM

## 2022-08-05 DIAGNOSIS — M797 Fibromyalgia: Secondary | ICD-10-CM

## 2022-08-05 MED ORDER — SLYND 4 MG PO TABS
1.0000 | ORAL_TABLET | Freq: Every day | ORAL | 4 refills | Status: AC
Start: 2022-08-05 — End: 2022-11-03

## 2022-08-05 NOTE — Progress Notes (Signed)
Refill for [facility]

## 2022-08-06 ENCOUNTER — Other Ambulatory Visit: Payer: Self-pay | Admitting: Psychiatry

## 2022-08-06 ENCOUNTER — Encounter: Payer: BC Managed Care – PPO | Admitting: Occupational Therapy

## 2022-08-06 ENCOUNTER — Ambulatory Visit (INDEPENDENT_AMBULATORY_CARE_PROVIDER_SITE_OTHER): Payer: BC Managed Care – PPO | Admitting: Psychiatry

## 2022-08-06 ENCOUNTER — Encounter: Payer: Self-pay | Admitting: Psychiatry

## 2022-08-06 VITALS — BP 144/83 | HR 90 | Ht 62.0 in | Wt 147.0 lb

## 2022-08-06 DIAGNOSIS — I639 Cerebral infarction, unspecified: Secondary | ICD-10-CM

## 2022-08-06 DIAGNOSIS — G43611 Persistent migraine aura with cerebral infarction, intractable, with status migrainosus: Secondary | ICD-10-CM

## 2022-08-06 MED ORDER — EMGALITY 120 MG/ML ~~LOC~~ SOAJ: 1.0000 | SUBCUTANEOUS | 6 refills | Status: DC

## 2022-08-06 MED ORDER — EMGALITY 120 MG/ML ~~LOC~~ SOAJ: 2.0000 | Freq: Once | SUBCUTANEOUS | 0 refills | Status: AC

## 2022-08-06 MED ORDER — NURTEC 75 MG PO TBDP
75.0000 mg | ORAL_TABLET | ORAL | 6 refills | Status: DC | PRN
Start: 2022-08-06 — End: 2023-12-11
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
## 2022-08-11 ENCOUNTER — Encounter: Payer: BC Managed Care – PPO | Admitting: Occupational Therapy

## 2022-08-13 ENCOUNTER — Encounter: Payer: BC Managed Care – PPO | Admitting: Occupational Therapy

## 2022-08-19 ENCOUNTER — Telehealth: Payer: Self-pay

## 2022-08-19 ENCOUNTER — Other Ambulatory Visit (HOSPITAL_COMMUNITY): Payer: Self-pay

## 2022-08-19 NOTE — Telephone Encounter (Signed)
Pharmacy Patient Advocate Encounter  Received notification from [facility] that Prior Authorization for Emgality 120MG /ML auto-injectors (migraine) has been APPROVED from 08/19/2022 to 09/16/2022. Ran test claim, Copay is $0  PA #/Case ID/Reference #: PA Case: [id number]

## 2022-08-19 NOTE — Telephone Encounter (Signed)
Tried to submit a PA for the maintenance dose but per [facility] PA is good for the maintenance dose and the loading dose-Ran a test claim for maintenance dose and it went thru successful as well.

## 2022-08-19 NOTE — Telephone Encounter (Signed)
Pharmacy Patient Advocate Encounter   Received notification from CoverMyMeds that prior authorization for Emgality 120MG /ML auto-injectors (migraine) is required/requested.   Insurance verification completed.   The patient is insured through [employer] .   Per test claim: PA required; PA submitted to [employer] via CoverMyMeds Key/confirmation #/EOC [id number] Status is pending

## 2022-08-19 NOTE — Telephone Encounter (Signed)
Pharmacy Patient Advocate Encounter   Received notification from CoverMyMeds that prior authorization for Nurtec 75MG  dispersible tablets is required/requested.   Insurance verification completed.   The patient is insured through [employer] .   Per test claim: PA required; PA submitted to [employer] via CoverMyMeds Key/confirmation #/EOC [id number] Status is pending

## 2022-08-20 ENCOUNTER — Other Ambulatory Visit (HOSPITAL_COMMUNITY): Payer: Self-pay

## 2022-08-20 NOTE — Telephone Encounter (Signed)
Pharmacy Patient Advocate Encounter  Received notification from [facility] that Prior Authorization for Nurtec 75MG  dispersible tablets has been APPROVED from 08/19/2022 to 08/19/2023. Ran test claim, Copay is $0 per 30DS/8 Tablets  PA #/Case ID/Reference #: PA Case ID #: [id number]

## 2022-09-01 ENCOUNTER — Ambulatory Visit: Payer: BC Managed Care – PPO | Admitting: Internal Medicine

## 2022-09-03 DIAGNOSIS — Z9641 Presence of insulin pump (external) (internal): Secondary | ICD-10-CM | POA: Diagnosis not present

## 2022-09-03 DIAGNOSIS — E1065 Type 1 diabetes mellitus with hyperglycemia: Secondary | ICD-10-CM | POA: Diagnosis not present

## 2022-09-03 DIAGNOSIS — E039 Hypothyroidism, unspecified: Secondary | ICD-10-CM | POA: Diagnosis not present

## 2022-09-03 DIAGNOSIS — E7800 Pure hypercholesterolemia, unspecified: Secondary | ICD-10-CM | POA: Diagnosis not present

## 2022-09-08 ENCOUNTER — Other Ambulatory Visit: Payer: Self-pay | Admitting: Internal Medicine

## 2022-09-08 DIAGNOSIS — M25531 Pain in right wrist: Secondary | ICD-10-CM

## 2022-09-24 DIAGNOSIS — Z0271 Encounter for disability determination: Secondary | ICD-10-CM

## 2022-09-25 ENCOUNTER — Other Ambulatory Visit (HOSPITAL_COMMUNITY): Payer: Self-pay

## 2022-10-08 DIAGNOSIS — E113511 Type 2 diabetes mellitus with proliferative diabetic retinopathy with macular edema, right eye: Secondary | ICD-10-CM | POA: Diagnosis not present

## 2022-10-08 DIAGNOSIS — E113592 Type 2 diabetes mellitus with proliferative diabetic retinopathy without macular edema, left eye: Secondary | ICD-10-CM | POA: Diagnosis not present

## 2022-10-08 DIAGNOSIS — H31013 Macula scars of posterior pole (postinflammatory) (post-traumatic), bilateral: Secondary | ICD-10-CM | POA: Diagnosis not present

## 2022-10-08 DIAGNOSIS — H43822 Vitreomacular adhesion, left eye: Secondary | ICD-10-CM | POA: Diagnosis not present

## 2022-10-21 ENCOUNTER — Encounter: Payer: Self-pay | Admitting: Internal Medicine

## 2022-10-21 ENCOUNTER — Ambulatory Visit (INDEPENDENT_AMBULATORY_CARE_PROVIDER_SITE_OTHER): Payer: BC Managed Care – PPO | Admitting: Internal Medicine

## 2022-10-21 VITALS — BP 134/82 | HR 108 | Resp 16 | Ht 62.0 in | Wt 148.0 lb

## 2022-10-21 DIAGNOSIS — R079 Chest pain, unspecified: Secondary | ICD-10-CM

## 2022-10-21 DIAGNOSIS — E785 Hyperlipidemia, unspecified: Secondary | ICD-10-CM

## 2022-10-21 DIAGNOSIS — G43009 Migraine without aura, not intractable, without status migrainosus: Secondary | ICD-10-CM

## 2022-10-21 DIAGNOSIS — E109 Type 1 diabetes mellitus without complications: Secondary | ICD-10-CM | POA: Diagnosis not present

## 2022-10-21 DIAGNOSIS — I200 Unstable angina: Secondary | ICD-10-CM | POA: Diagnosis not present

## 2022-10-21 NOTE — Patient Instructions (Signed)
It was a pleasure to see you today.  Thank you for giving [place] the opportunity to be involved in your care.  Below is a brief recap of your visit and next steps.  We will plan to see you again in 3 months.  Summary No medication changes today Cardiology referral placed Follow up in 3 months

## 2022-10-21 NOTE — Progress Notes (Signed)
Established Patient Office Visit  Subjective   Patient ID: [name], female    DOB: [birth year]  Age: 52 y.o. MRN: [id number]  Chief Complaint  Patient presents with   Follow-up    The day after her last visit on 06/28/22 she had a TIA and had to go to the ER and stayed overnight. For the last month she has been having a heavy feeling in the middle of her chest   [name] returns to care today for routine follow-up.  She was last evaluated by me for an acute visit on 6/7 for evaluation of migraine headaches.  At that time Topamax was increased to 50 mg twice daily and Imitrex was added for as needed abortive therapy.  1 month follow-up was arranged.  In the interim, she presented to the emergency department on 6/8 endorsing symptoms concerning for stroke.  Her workup was reassuring.  Ultimately, her symptoms were attributed to complex migraine.  She was evaluated by neurology for follow-up on 7/17.  Emgality was started for migraine prevention and Nurtec for rescue therapy.  There have otherwise been no acute interval events.    [name] reports feeling fairly well today.  Her acute concern is of recurring chest heaviness over the last 1-2 months.  She describes someone sitting on her chest.  Symptoms occur both with activity and at rest.  Her family medical history is significant for CAD as her father had an MI at age 47.  Past Medical History:  Diagnosis Date   Diabetes (HCC)    Fibromyalgia    High cholesterol    Thyroid disease    Past Surgical History:  Procedure Laterality Date   caesarean     Social History   Tobacco Use   Smoking status: Never   Smokeless tobacco: Never  Vaping Use   Vaping status: Never Used  Substance Use Topics   Alcohol use: Yes    Comment: 1 shot of liquor in the morning and 2 at night daily since to send first 2023.  In 20s heavy use of liquor after her first husband's death   Drug use: No   Family History  Problem Relation Age  of Onset   Cancer Paternal Grandfather    Cancer Paternal Grandmother    Cancer Maternal Grandmother    Cancer Maternal Grandfather    Heart attack Father    Thyroid disease Sister    Fibromyalgia Sister    Diabetes Other    No Known Allergies  Review of Systems  Constitutional:  Negative for chills and fever.  HENT:  Negative for sore throat.   Respiratory:  Negative for cough and shortness of breath.   Cardiovascular:  Positive for chest pain (Intermittent chest heaviness occurring both with activity and at rest). Negative for palpitations and leg swelling.  Gastrointestinal:  Negative for abdominal pain, blood in stool, constipation, diarrhea, nausea and vomiting.  Genitourinary:  Negative for dysuria and hematuria.  Musculoskeletal:  Negative for myalgias.  Skin:  Negative for itching and rash.  Neurological:  Negative for dizziness and headaches.  Psychiatric/Behavioral:  Negative for depression and suicidal ideas.      Objective:     BP 134/82   Pulse (!) 108   Resp 16   Ht 5\' 2"  (1.575 m)   Wt 148 lb (67.1 kg)   SpO2 98%   BMI 27.07 kg/m  BP Readings from Last 3 Encounters:  10/22/22 (!) 136/98  10/21/22 134/82  08/06/22 Marland Kitchen)  144/83   Physical Exam Vitals reviewed.  Constitutional:      General: She is not in acute distress.    Appearance: Normal appearance. She is not toxic-appearing.  HENT:     Head: Normocephalic and atraumatic.     Right Ear: External ear normal.     Left Ear: External ear normal.     Nose: Nose normal. No congestion or rhinorrhea.     Mouth/Throat:     Mouth: Mucous membranes are moist.     Pharynx: Oropharynx is clear. No oropharyngeal exudate or posterior oropharyngeal erythema.  Eyes:     General: No scleral icterus.    Extraocular Movements: Extraocular movements intact.     Conjunctiva/sclera: Conjunctivae normal.     Pupils: Pupils are equal, round, and reactive to light.  Cardiovascular:     Rate and Rhythm: Normal rate  and regular rhythm.     Pulses: Normal pulses.     Heart sounds: Normal heart sounds. No murmur heard.    No friction rub. No gallop.  Pulmonary:     Effort: Pulmonary effort is normal.     Breath sounds: Normal breath sounds. No wheezing, rhonchi or rales.  Abdominal:     General: Abdomen is flat. Bowel sounds are normal. There is no distension.     Palpations: Abdomen is soft.     Tenderness: There is no abdominal tenderness.  Musculoskeletal:        General: No swelling. Normal range of motion.     Cervical back: Normal range of motion.     Right lower leg: No edema.     Left lower leg: No edema.  Lymphadenopathy:     Cervical: No cervical adenopathy.  Skin:    General: Skin is warm and dry.     Capillary Refill: Capillary refill takes less than 2 seconds.     Coloration: Skin is not jaundiced.  Neurological:     General: No focal deficit present.     Mental Status: She is alert and oriented to person, place, and time.  Psychiatric:        Mood and Affect: Mood normal.        Behavior: Behavior normal.   Last CBC Lab Results  Component Value Date   WBC 7.0 06/28/2022   HGB 13.5 06/28/2022   HCT 39.9 06/28/2022   MCV 91.5 06/28/2022   MCH 31.0 06/28/2022   RDW 12.8 06/28/2022   PLT 155 06/28/2022   Last metabolic panel Lab Results  Component Value Date   GLUCOSE 221 (H) 06/28/2022   NA 132 (L) 06/28/2022   K 4.2 06/28/2022   CL 103 06/28/2022   CO2 22 06/28/2022   BUN 11 06/28/2022   CREATININE 0.78 06/28/2022   GFRNONAA >60 06/28/2022   CALCIUM 8.5 (L) 06/28/2022   PHOS 2.7 09/24/2008   PROT 6.1 (L) 06/28/2022   ALBUMIN 3.4 (L) 06/28/2022   LABGLOB 2.2 11/27/2021   AGRATIO 2.1 11/27/2021   BILITOT 0.8 06/28/2022   ALKPHOS 97 06/28/2022   AST 16 06/28/2022   ALT 17 06/28/2022   ANIONGAP 7 06/28/2022   Last lipids Lab Results  Component Value Date   CHOL 173 06/29/2022   HDL 63 06/29/2022   LDLCALC 96 06/29/2022   TRIG 71 06/29/2022   CHOLHDL 2.7  06/29/2022   Last hemoglobin A1c Lab Results  Component Value Date   HGBA1C 9.2 (H) 06/29/2022   Last thyroid functions Lab Results  Component Value Date  TSH 2.642 06/29/2022   Last vitamin B12 and Folate Lab Results  Component Value Date   VITAMINB12 4,267 (H) 06/29/2022   FOLATE 8.1 11/27/2021     Assessment & Plan:   Problem List Items Addressed This Visit       Migraines    Last evaluated by me 6/7 for an acute visit in the setting of increasing frequency of migraine headaches.  Topamax was increased to 50 mg twice daily at that time.  ED presentation 6/8 for strokelike symptoms that were ultimately attributed to a complex migraine.  Evaluated by neurology 7/17.  Emgality and Nurtec were prescribed at that time.  She states that she is currently taking Topamax 25 mg daily.  She is not taking Emgality and has not needed to use Nurtec.  She has not had any migraine headaches recently.      Type 1 diabetes mellitus without complications (HCC)    Followed by endocrinology.  A1c 9.2 in June.  Reports that follow-up with endocrinology is scheduled for later this fall.      Hyperlipidemia    Lipid panel updated in June.  Total cholesterol 163 and LDL 96.  She is currently prescribed rosuvastatin 10 mg daily.      Chest pain - Primary    Today she endorses a 1-66-month history of intermittent chest pain located in the center of her chest described as heaviness and feeling as though someone is sitting on her chest.  Symptoms occur both with rest and activity.  Family history is significant for CAD as her father had an MI at age 14. -Cardiology referral placed today for further evaluation.      Return in about 3 months (around 01/21/2023).   [name], MD

## 2022-10-22 ENCOUNTER — Encounter: Payer: Self-pay | Admitting: Cardiology

## 2022-10-22 ENCOUNTER — Ambulatory Visit: Payer: BC Managed Care – PPO | Attending: Cardiology | Admitting: Cardiology

## 2022-10-22 VITALS — BP 136/98 | HR 110 | Ht 62.0 in | Wt 148.0 lb

## 2022-10-22 DIAGNOSIS — I200 Unstable angina: Secondary | ICD-10-CM

## 2022-10-22 DIAGNOSIS — R079 Chest pain, unspecified: Secondary | ICD-10-CM

## 2022-10-22 MED ORDER — METOPROLOL TARTRATE 100 MG PO TABS: ORAL_TABLET | ORAL | 0 refills | Status: DC

## 2022-10-22 NOTE — Progress Notes (Signed)
Clinical Summary [name] is a 52 y.o.female seen today as a new consult, referred by Dr Durwin Nora for the following medical problems.   1.Chest pain - started about 2 months - midchest, heaviness 8/10 in severity. Can occur at rest or with activity - can get SOB, can feel hot, dizzy.  - pain can last 5 minuts up to 1 hour. Not positional - increasing in frequency, now about 3-4 times week - no relation to food or eating - walks up a flight of stairs to get to her apartment, occasionally pain with walking up the stairs   CAD Risk factors: DM1, HLD, father fatal MI at 18. Sister 40 had coronary stents 06/2022 echo: LVEF 65-70%, no WMAs, normal diastolic, normal RV - chronic back pain, cannot run on treadmill.    Past Medical History:  Diagnosis Date   Diabetes (HCC)    Fibromyalgia    High cholesterol    Thyroid disease      No Known Allergies   Current Outpatient Medications  Medication Sig Dispense Refill   aspirin EC 81 MG tablet Take 1 tablet (81 mg total) by mouth daily. Swallow whole. 30 tablet 12   cholecalciferol (VITAMIN D3) 25 MCG (1000 UNIT) tablet Take 1,000 Units by mouth daily.     diclofenac (VOLTAREN) 75 MG EC tablet Take 1 tablet (75 mg total) by mouth daily. 90 tablet 0   Drospirenone (SLYND) 4 MG TABS Take 1 tablet (4 mg total) by mouth daily. 90 tablet 4   DULoxetine (CYMBALTA) 60 MG capsule Take 1 capsule (60 mg total) by mouth daily. 30 capsule 0   Galcanezumab-gnlm (EMGALITY) 120 MG/ML SOAJ Inject 1 Pen into the skin every 30 (thirty) days. 1.12 mL 6   insulin aspart (NOVOLOG) 100 UNIT/ML injection by Pump Prime route continuous.     levothyroxine (SYNTHROID) 75 MCG tablet Take 75 mcg by mouth daily.     Rimegepant Sulfate (NURTEC) 75 MG TBDP Take 1 tablet (75 mg total) by mouth as needed (for migraine). 8 tablet 6   rosuvastatin (CRESTOR) 5 MG tablet Take 1 tablet (5 mg total) by mouth daily. 30 tablet 2   Vitamin D, Ergocalciferol,  (DRISDOL) 1.25 MG (50000 UNIT) CAPS capsule Take 50,000 Units by mouth every Wednesday.     topiramate (TOPAMAX) 50 MG tablet Take 1 tablet (50 mg total) by mouth 2 (two) times daily. (Patient taking differently: Take 25 mg by mouth 2 (two) times daily.) 60 tablet 2   No current facility-administered medications for this visit.     Past Surgical History:  Procedure Laterality Date   caesarean       No Known Allergies    Family History  Problem Relation Age of Onset   Cancer Paternal Grandfather    Cancer Paternal Grandmother    Cancer Maternal Grandmother    Cancer Maternal Grandfather    Heart attack Father    Thyroid disease Sister    Fibromyalgia Sister    Diabetes Other      Social History [name] reports that she has never smoked. She has never used smokeless tobacco. [name] reports current alcohol use.   Review of Systems CONSTITUTIONAL: No weight loss, fever, chills, weakness or fatigue.  HEENT: Eyes: No visual loss, blurred vision, double vision or yellow sclerae.No hearing loss, sneezing, congestion, runny nose or sore throat.  SKIN: No rash or itching.  CARDIOVASCULAR: per hpi RESPIRATORY: per hpi GASTROINTESTINAL: No anorexia, nausea, vomiting or  diarrhea. No abdominal pain or blood.  GENITOURINARY: No burning on urination, no polyuria NEUROLOGICAL: No headache, dizziness, syncope, paralysis, ataxia, numbness or tingling in the extremities. No change in bowel or bladder control.  MUSCULOSKELETAL: No muscle, back pain, joint pain or stiffness.  LYMPHATICS: No enlarged nodes. No history of splenectomy.  PSYCHIATRIC: No history of depression or anxiety.  ENDOCRINOLOGIC: No reports of sweating, cold or heat intolerance. No polyuria or polydipsia.  Marland Kitchen   Physical Examination There were no vitals filed for this visit. Filed Weights   10/22/22 1327  Weight: 148 lb (67.1 kg)    Gen: resting comfortably, no acute distress HEENT: no scleral  icterus, pupils equal round and reactive, no palptable cervical adenopathy,  CV: RRR, no m/rg, no jvd Resp: Clear to auscultation bilaterally GI: abdomen is soft, non-tender, non-distended, normal bowel sounds, no hepatosplenomegaly MSK: extremities are warm, no edema.  Skin: warm, no rash Neuro:  no focal deficits Psych: appropriate affect    Assessment and Plan    1. Chest pain - multiple CAD risk factors - symptoms are mixed in description - EKG suggests anteroseptal Qwaves, poor R wave progression - not able to run on treadmill due to chronic back pain - I think best testing option would be coronary CTA, we will arrange   F/u pending coronary CTA results     Antoine Poche, M.D., F.A.C.C.

## 2022-10-22 NOTE — Patient Instructions (Addendum)
Medication Instructions:  Your physician recommends that you continue on your current medications as directed. Please refer to the Current Medication list given to you today.  *If you need a refill on your cardiac medications before your next appointment, please call your pharmacy*   Lab Work: BMET  If you have labs (blood work) drawn today and your tests are completely normal, you will receive your results only by: MyChart Message (if you have MyChart) OR A paper copy in the mail If you have any lab test that is abnormal or we need to change your treatment, we will call you to review the results.   Testing/Procedures: Coronary CTA   Follow-Up: At [facility], you and your health needs are our priority.  As part of our continuing mission to provide you with exceptional heart care, we have created designated Provider Care Teams.  These Care Teams include your primary Cardiologist (physician) and Advanced Practice Providers (APPs -  Physician Assistants and Nurse Practitioners) who all work together to provide you with the care you need, when you need it.  We recommend signing up for the patient portal called "MyChart".  Sign up information is provided on this After Visit Summary.  MyChart is used to connect with patients for Virtual Visits (Telemedicine).  Patients are able to view lab/test results, encounter notes, upcoming appointments, etc.  Non-urgent messages can be sent to your provider as well.   To learn more about what you can do with MyChart, go to [email or web address].    Your next appointment:      Provider:   You may see Dina Rich, MD or one of the following Advanced Practice Providers on your designated Care Team:   Randall An, PA-C  Jacolyn Reedy, PA-C     Other Instructions   Your cardiac CT will be scheduled at one of the below locations:   [facility] [address] [place], [place] [address] [phone]  If scheduled  at [facility], please arrive at the [facility] En[facility]ance (Entrance C2) of [facility] 30 minutes prior to test start time. You can use the FREE valet parking offered at entrance C (encouraged to control the heart rate for the test)  Proceed to the [facility] Radiology Department (first floor) to check-in and test prep.  All radiology patients and guests should use entrance C2 at [facility], accessed from [facility], even though the hospital's physical address listed is [address].     Please follow these instructions carefully (unless otherwise directed):   On the Night Before the Test: Be sure to Drink plenty of water. Do not consume any caffeinated/decaffeinated beverages or chocolate 12 hours prior to your test. Do not take any antihistamines 12 hours prior to your test.  On the Day of the Test: Drink plenty of water until 1 hour prior to the test. Do not eat any food 1 hour prior to test. You may take your regular medications prior to the test.  Take metoprolol (Lopressor) two hours prior to test. If you take Furosemide/Hydrochlorothiazide/Spironolactone, please HOLD on the morning of the test. FEMALES- please wear underwire-free bra if available, avoid dresses & tight clothing        After the Test: Drink plenty of water. After receiving IV contrast, you may experience a mild flushed feeling. This is normal. On occasion, you may experience a mild rash up to 24 hours after the test. This is not dangerous.  If this occurs, you can take Benadryl 25 mg and increase your fluid intake. If you experience trouble breathing, this can be serious. If it is severe call 911 IMMEDIATELY. If it is mild, please call our office. If you take any of these medications: Glipizide/Metformin, Avandament, Glucavance, please do not take 48 hours after completing test unless otherwise instructed.  We will call to schedule your test 2-4 weeks out  understanding that some insurance companies will need an authorization prior to the service being performed.   For more information and frequently asked questions, please visit our website : [email or web address]  For non-scheduling related questions, please contact the cardiac imaging nurse navigator should you have any questions/concerns: Cardiac Imaging Nurse Navigators Direct Office Dial: [phone]   For scheduling needs, including cancellations and rescheduling, please call [place], [phone].

## 2022-10-29 ENCOUNTER — Encounter: Payer: Self-pay | Admitting: Internal Medicine

## 2022-10-29 DIAGNOSIS — R079 Chest pain, unspecified: Secondary | ICD-10-CM | POA: Insufficient documentation

## 2022-10-29 NOTE — Assessment & Plan Note (Signed)
Lipid panel updated in June.  Total cholesterol 163 and LDL 96.  She is currently prescribed rosuvastatin 10 mg daily.

## 2022-10-29 NOTE — Assessment & Plan Note (Signed)
Followed by endocrinology.  A1c 9.2 in June.  Reports that follow-up with endocrinology is scheduled for later this fall.

## 2022-10-29 NOTE — Assessment & Plan Note (Signed)
Last evaluated by me 6/7 for an acute visit in the setting of increasing frequency of migraine headaches.  Topamax was increased to 50 mg twice daily at that time.  ED presentation 6/8 for strokelike symptoms that were ultimately attributed to a complex migraine.  Evaluated by neurology 7/17.  Emgality and Nurtec were prescribed at that time.  She states that she is currently taking Topamax 25 mg daily.  She is not taking Emgality and has not needed to use Nurtec.  She has not had any migraine headaches recently.

## 2022-10-29 NOTE — Assessment & Plan Note (Signed)
Today she endorses a 1-11-month history of intermittent chest pain located in the center of her chest described as heaviness and feeling as though someone is sitting on her chest.  Symptoms occur both with rest and activity.  Family history is significant for CAD as her father had an MI at age 54. -Cardiology referral placed today for further evaluation.

## 2022-10-31 ENCOUNTER — Encounter: Payer: Self-pay | Admitting: Cardiology

## 2022-10-31 DIAGNOSIS — E103511 Type 1 diabetes mellitus with proliferative diabetic retinopathy with macular edema, right eye: Secondary | ICD-10-CM | POA: Diagnosis not present

## 2022-11-03 ENCOUNTER — Other Ambulatory Visit: Payer: Self-pay | Admitting: Internal Medicine

## 2022-11-03 ENCOUNTER — Ambulatory Visit (HOSPITAL_COMMUNITY): Payer: BC Managed Care – PPO

## 2022-11-03 DIAGNOSIS — M797 Fibromyalgia: Secondary | ICD-10-CM

## 2022-11-03 DIAGNOSIS — F410 Panic disorder [episodic paroxysmal anxiety] without agoraphobia: Secondary | ICD-10-CM

## 2022-11-03 NOTE — Telephone Encounter (Signed)
Ok to write note. Is she asking for a note that limits her activities at work or that keeps her out of work until after the CT scan? Please provide the documentation she needs   Dominga Ferry MD

## 2022-11-04 ENCOUNTER — Other Ambulatory Visit (HOSPITAL_COMMUNITY)
Admission: RE | Admit: 2022-11-04 | Discharge: 2022-11-04 | Disposition: A | Discharge status: Home / Self Care | Payer: BC Managed Care – PPO | Source: Ambulatory Visit | Attending: Cardiology | Admitting: Cardiology

## 2022-11-04 ENCOUNTER — Telehealth: Payer: Self-pay | Admitting: *Deleted

## 2022-11-04 ENCOUNTER — Encounter (HOSPITAL_COMMUNITY): Payer: Self-pay

## 2022-11-04 DIAGNOSIS — I200 Unstable angina: Secondary | ICD-10-CM | POA: Insufficient documentation

## 2022-11-04 LAB — BASIC METABOLIC PANEL
Anion gap: 7 (ref 5–15)
BUN: 6 mg/dL (ref 6–20)
CO2: 24 mmol/L (ref 22–32)
Calcium: 8.9 mg/dL (ref 8.9–10.3)
Chloride: 106 mmol/L (ref 98–111)
Creatinine, Ser: 0.67 mg/dL (ref 0.44–1.00)
GFR, Estimated: >60 mL/min (ref >60)
Glucose, Bld: 233 mg/dL — ABNORMAL HIGH (ref 70–99)
Potassium: 4.5 mmol/L (ref 3.5–5.1)
Sodium: 137 mmol/L (ref 135–145)

## 2022-11-04 NOTE — Telephone Encounter (Signed)
Contacted and reminded that BMET is due before CT on Thursday. Says she will go to [employer] today or tomorrow to complete requested lab work.

## 2022-11-06 ENCOUNTER — Ambulatory Visit: Payer: BC Managed Care – PPO | Admitting: Psychiatry

## 2022-11-06 ENCOUNTER — Ambulatory Visit (HOSPITAL_COMMUNITY)
Admission: RE | Admit: 2022-11-06 | Discharge: 2022-11-06 | Disposition: A | Discharge status: Home / Self Care | Payer: BC Managed Care – PPO | Source: Ambulatory Visit | Attending: Cardiology | Admitting: Cardiology

## 2022-11-06 DIAGNOSIS — R079 Chest pain, unspecified: Secondary | ICD-10-CM | POA: Insufficient documentation

## 2022-11-06 DIAGNOSIS — E559 Vitamin D deficiency, unspecified: Secondary | ICD-10-CM | POA: Diagnosis not present

## 2022-11-06 DIAGNOSIS — E109 Type 1 diabetes mellitus without complications: Secondary | ICD-10-CM | POA: Diagnosis not present

## 2022-11-06 DIAGNOSIS — E039 Hypothyroidism, unspecified: Secondary | ICD-10-CM | POA: Diagnosis not present

## 2022-11-06 DIAGNOSIS — E7800 Pure hypercholesterolemia, unspecified: Secondary | ICD-10-CM | POA: Diagnosis not present

## 2022-11-06 DIAGNOSIS — I200 Unstable angina: Secondary | ICD-10-CM | POA: Diagnosis not present

## 2022-11-06 MED ORDER — METOPROLOL TARTRATE 5 MG/5ML IV SOLN
5.0000 mg | Freq: Once | INTRAVENOUS | Status: AC
  Administered 2022-11-06: 5 mg via INTRAVENOUS

## 2022-11-06 MED ORDER — NITROGLYCERIN 0.4 MG SL SUBL: 0.8000 mg | SUBLINGUAL_TABLET | Freq: Once | SUBLINGUAL | Status: DC

## 2022-11-06 MED ORDER — DILTIAZEM HCL 25 MG/5ML IV SOLN
5.0000 mg | Freq: Once | INTRAVENOUS | Status: AC
  Administered 2022-11-06: 5 mg via INTRAVENOUS

## 2022-11-06 MED ORDER — IOHEXOL 350 MG/ML SOLN
95.0000 mL | Freq: Once | INTRAVENOUS | Status: AC | PRN
  Administered 2022-11-06: 95 mL via INTRAVENOUS

## 2022-11-27 NOTE — Telephone Encounter (Signed)
Erroneous encounter

## 2022-12-08 DIAGNOSIS — E559 Vitamin D deficiency, unspecified: Secondary | ICD-10-CM | POA: Diagnosis not present

## 2022-12-08 DIAGNOSIS — E039 Hypothyroidism, unspecified: Secondary | ICD-10-CM | POA: Diagnosis not present

## 2022-12-08 DIAGNOSIS — E109 Type 1 diabetes mellitus without complications: Secondary | ICD-10-CM | POA: Diagnosis not present

## 2022-12-08 DIAGNOSIS — E7800 Pure hypercholesterolemia, unspecified: Secondary | ICD-10-CM | POA: Diagnosis not present

## 2022-12-08 NOTE — Progress Notes (Unsigned)
Cardiology Office Note:  .   Date:  12/08/2022  ID:  [name], DOB [birth year], MRN [id number] PCP: [name], MD  [facility] Providers Cardiologist:  Dina Rich, MD { Click to update primary MD,subspecialty MD or APP then REFRESH:1}   History of Present Illness: .   [name] is a 52 y.o. female with history of HLD, DM1, family history early CAD, Father died MI 24, sister coronary stents age 57.  Patient saw Dr. Wyline Mood 10/22/22 with chest pain and EKG suggests anteroseptal Q waves and poor R wave progression. Coronary CTA ordered. Calcium score 0 no CAD  ROS: ***  Studies Reviewed: Marland Kitchen         Prior CV Studies: {Select studies to display:26339}  Coronary CTA 10/2022 Coronary Arteries:  Normal coronary origin.  Right dominance.   Coronary Calcium Score: 0   Percentile: 1st for age, sex, and race matched control.   Plaque Analysis: None   RCA is a large dominant artery that gives rise to PDA and PLA. There is no significant plaque.   Left main is a large artery that gives rise to LAD and LCX arteries. There is no significant plaque.   LAD is a large vessel that gives rise to one large D1 Branch. There is no significant plaque.   LCX is a non-dominant artery that gives rise to two OM branches. There is no significant plaque.   Other findings:   Aorta: Normal size.  No calcifications.  No dissection.   Main Pulmonary Artery: Normal size of the pulmonary artery.   Systemic Veins: Normal drainage   Aortic Valve:  Tri-leaflet.  No calcifications.   Mitral valve: Mild leaflet tip thickening, no calcifications.   Normal variant pulmonary vein drainage into the left atrium, left common pulmonary trunk.   Normal left atrial appendage without a thrombus.   Interatrial septum with no communications. Small left atrial diverticulum noted- normal variant anatomy.   Left Ventricle: Normal size   Left Atrium: Normal size   Right  Ventricle: Normal size   Right Atrium: Normal size   Pericardium: Normal thickness   Extra-cardiac findings: See attached radiology report for non-cardiac structures.   Artifact: Moderate slab artifact   IMPRESSION: 1. Coronary calcium score of 0. This was 1st percentile for age, sex, and race matched control.   2. Normal coronary origin with right dominance.   3. CAD-RADS 0. No evidence of CAD (0%). Consider non-atherosclerotic causes of chest pain.  Overread IMPRESSION: 1. Indeterminate small pulmonary nodules. Largest pulmonary nodule measures 6 mm in left lower lobe. Non-contrast chest CT at 3-6 months is recommended. If the nodules are stable at time of repeat CT, then future CT at 18-24 months (from today's scan) is considered optional for low-risk patients, but is recommended for high-risk patients. This recommendation follows the consensus statement: Guidelines for Management of Incidental Pulmonary Nodules Detected on CT Images: From the Fleischner Society 2017; Radiology 2017; 284:228-243.     Electronically Signed   By: Richarda Overlie M.D.   On: 12/02/2022 21:34    Risk Assessment/Calculations:   {Does this patient have ATRIAL FIBRILLATION?:[phone]} No BP recorded.  {Refresh Note OR Click here to enter BP  :1}***       Physical Exam:   VS:  There were no vitals taken for this visit.   Wt Readings from Last 3 Encounters:  10/22/22 148 lb (67.1 kg)  10/21/22 148 lb (67.1 kg)  08/06/22 147 lb (66.7  kg)    GEN: Well nourished, well developed in no acute distress NECK: No JVD; No carotid bruits CARDIAC: ***RRR, no murmurs, rubs, gallops RESPIRATORY:  Clear to auscultation without rales, wheezing or rhonchi  ABDOMEN: Soft, non-tender, non-distended EXTREMITIES:  No edema; No deformity   ASSESSMENT AND PLAN: .    Chest pain, Coronary CTA calcium score 0, no CAD   Indeterminate small pulmonary nodules on CT. Largest pulmonary nodule measures 6 mm in left  lower lobe. Non-contrast chest CT at 3-6 months is recommended. If the nodules are stable at time of repeat CT, then future CT at 18-24 months  HLD  Family history of CAD     {Are you ordering a CV Procedure (e.g. stress test, cath, DCCV, TEE, etc)?   Press F2        :[id number]}  Dispo: ***  Signed, Jacolyn Reedy, PA-C

## 2022-12-10 DIAGNOSIS — E039 Hypothyroidism, unspecified: Secondary | ICD-10-CM | POA: Diagnosis not present

## 2022-12-10 DIAGNOSIS — Z9641 Presence of insulin pump (external) (internal): Secondary | ICD-10-CM | POA: Diagnosis not present

## 2022-12-10 DIAGNOSIS — E1065 Type 1 diabetes mellitus with hyperglycemia: Secondary | ICD-10-CM | POA: Diagnosis not present

## 2022-12-10 DIAGNOSIS — E7800 Pure hypercholesterolemia, unspecified: Secondary | ICD-10-CM | POA: Diagnosis not present

## 2022-12-22 ENCOUNTER — Ambulatory Visit: Payer: BC Managed Care – PPO | Admitting: Physician Assistant

## 2022-12-22 DIAGNOSIS — Z8249 Family history of ischemic heart disease and other diseases of the circulatory system: Secondary | ICD-10-CM

## 2022-12-22 DIAGNOSIS — R918 Other nonspecific abnormal finding of lung field: Secondary | ICD-10-CM

## 2022-12-22 DIAGNOSIS — R079 Chest pain, unspecified: Secondary | ICD-10-CM

## 2022-12-22 DIAGNOSIS — E785 Hyperlipidemia, unspecified: Secondary | ICD-10-CM

## 2023-01-07 ENCOUNTER — Telehealth: Payer: Self-pay | Admitting: Cardiology

## 2023-01-07 NOTE — Telephone Encounter (Signed)
Patient is returning call in regards to results. Requesting call back. 

## 2023-01-07 NOTE — Telephone Encounter (Signed)
Patient notified. Patient has appointment with B. Strader on 1/3 and would like to wait until that appointment to have testing ordered.

## 2023-01-07 NOTE — Telephone Encounter (Signed)
Per Dr. Wyline Mood:  Please update patient that this type of CT is read by both a cardiologist and radiologist. The cardiologist report was as reported previously to her that the arteries look good. The radiologist reviews all the other parts of the scan and their read came out quite a bit later and did detect a few very small spots on the lungs which are not worrisome and could be remnants of a prior old infection but she will need a repeat noncontrast chest CT in January for lung nodules please Dominga Ferry MD

## 2023-01-22 ENCOUNTER — Encounter: Payer: Self-pay | Admitting: Internal Medicine

## 2023-01-22 ENCOUNTER — Ambulatory Visit (INDEPENDENT_AMBULATORY_CARE_PROVIDER_SITE_OTHER): Payer: BC Managed Care – PPO | Admitting: Internal Medicine

## 2023-01-22 VITALS — BP 108/67 | HR 113 | Ht 62.0 in | Wt 150.0 lb

## 2023-01-22 DIAGNOSIS — F410 Panic disorder [episodic paroxysmal anxiety] without agoraphobia: Secondary | ICD-10-CM | POA: Diagnosis not present

## 2023-01-22 DIAGNOSIS — R296 Repeated falls: Secondary | ICD-10-CM | POA: Insufficient documentation

## 2023-01-22 DIAGNOSIS — F411 Generalized anxiety disorder: Secondary | ICD-10-CM

## 2023-01-22 DIAGNOSIS — R911 Solitary pulmonary nodule: Secondary | ICD-10-CM | POA: Insufficient documentation

## 2023-01-22 MED ORDER — HYDROXYZINE PAMOATE 25 MG PO CAPS
25.0000 mg | ORAL_CAPSULE | Freq: Three times a day (TID) | ORAL | 0 refills | Status: DC | PRN
Start: 2023-01-22 — End: 2023-09-11

## 2023-01-22 NOTE — Progress Notes (Signed)
 Established Patient Office Visit  Subjective   Patient ID: [name], female    DOB: 1970/05/12  Age: 53 y.o. MRN: [id number]  Chief Complaint  Patient presents with   Follow-up   [name] returns to care today for follow-up.  She was last evaluated by me on 10/1 at which time she endorsed chest pain both at rest and with activity.  She was referred to cardiology and was seen by Dr. Alvan on 10/2.  Ultimately underwent CT coronary calcium  with score 0.  No medication changes were made and 33-month follow-up was arranged.  In the interim, she has been evaluated by rheumatology.  There have otherwise been no acute interval events.  [name] reports feeling poorly today.  She attributes this to increasing stress and anxiety related to losing her job and going through divorce.  She also reports 3 falls since her last appointment.  She describes briefly blacking out prior to her falls.  They were not associated with sudden changes in position.  Most recently, she was getting ready to walk her dog and stepped out of her house but fell while walking down the stairs.  She does not describe these falls as mechanical in nature.  Past Medical History:  Diagnosis Date   Diabetes (HCC)    Fibromyalgia    High cholesterol    Thyroid  disease    Past Surgical History:  Procedure Laterality Date   caesarean     Social History   Tobacco Use   Smoking status: Never   Smokeless tobacco: Never  Vaping Use   Vaping status: Never Used  Substance Use Topics   Alcohol use: Yes    Comment: 1 shot of liquor in the morning and 2 at night daily since to send first 2023.  In 20s heavy use of liquor after her first husband's death   Drug use: No   Family History  Problem Relation Age of Onset   Cancer Paternal Grandfather    Cancer Paternal Grandmother    Cancer Maternal Grandmother    Cancer Maternal Grandfather    Heart attack Father    Thyroid  disease Sister    Fibromyalgia  Sister    Diabetes Other    No Known Allergies  Review of Systems  Psychiatric/Behavioral:  The patient is nervous/anxious.   All other systems reviewed and are negative.    Objective:     BP 108/67   Pulse (!) 113   Ht 5' 2 (1.575 m)   Wt 150 lb (68 kg)   SpO2 96%   BMI 27.44 kg/m  BP Readings from Last 3 Encounters:  01/22/23 108/67  11/06/22 107/85  10/22/22 (!) 136/98   Physical Exam Vitals reviewed.  Constitutional:      General: She is not in acute distress.    Appearance: Normal appearance. She is not toxic-appearing.  HENT:     Head: Normocephalic and atraumatic.     Right Ear: External ear normal.     Left Ear: External ear normal.     Nose: Nose normal. No congestion or rhinorrhea.     Mouth/Throat:     Mouth: Mucous membranes are moist.     Pharynx: Oropharynx is clear. No oropharyngeal exudate or posterior oropharyngeal erythema.  Eyes:     General: No scleral icterus.    Extraocular Movements: Extraocular movements intact.     Conjunctiva/sclera: Conjunctivae normal.     Pupils: Pupils are equal, round, and reactive to light.  Cardiovascular:  Rate and Rhythm: Normal rate and regular rhythm.     Pulses: Normal pulses.     Heart sounds: Normal heart sounds. No murmur heard.    No friction rub. No gallop.  Pulmonary:     Effort: Pulmonary effort is normal.     Breath sounds: Normal breath sounds. No wheezing, rhonchi or rales.  Abdominal:     General: Abdomen is flat. Bowel sounds are normal. There is no distension.     Palpations: Abdomen is soft.     Tenderness: There is no abdominal tenderness.  Musculoskeletal:        General: No swelling. Normal range of motion.     Cervical back: Normal range of motion.     Right lower leg: No edema.     Left lower leg: No edema.  Lymphadenopathy:     Cervical: No cervical adenopathy.  Skin:    General: Skin is warm and dry.     Capillary Refill: Capillary refill takes less than 2 seconds.      Coloration: Skin is not jaundiced.  Neurological:     General: No focal deficit present.     Mental Status: She is alert and oriented to person, place, and time.  Psychiatric:        Mood and Affect: Mood normal.        Behavior: Behavior normal.   Last CBC Lab Results  Component Value Date   WBC 7.0 06/28/2022   HGB 13.5 06/28/2022   HCT 39.9 06/28/2022   MCV 91.5 06/28/2022   MCH 31.0 06/28/2022   RDW 12.8 06/28/2022   PLT 155 06/28/2022   Last metabolic panel Lab Results  Component Value Date   GLUCOSE 233 (H) 11/04/2022   NA 137 11/04/2022   K 4.5 11/04/2022   CL 106 11/04/2022   CO2 24 11/04/2022   BUN 6 11/04/2022   CREATININE 0.67 11/04/2022   GFRNONAA >60 11/04/2022   CALCIUM  8.9 11/04/2022   PHOS 2.7 09/24/2008   PROT 6.1 (L) 06/28/2022   ALBUMIN 3.4 (L) 06/28/2022   LABGLOB 2.2 11/27/2021   AGRATIO 2.1 11/27/2021   BILITOT 0.8 06/28/2022   ALKPHOS 97 06/28/2022   AST 16 06/28/2022   ALT 17 06/28/2022   ANIONGAP 7 11/04/2022   Last lipids Lab Results  Component Value Date   CHOL 173 06/29/2022   HDL 63 06/29/2022   LDLCALC 96 06/29/2022   TRIG 71 06/29/2022   CHOLHDL 2.7 06/29/2022   Last hemoglobin A1c Lab Results  Component Value Date   HGBA1C 9.2 (H) 06/29/2022   Last thyroid  functions Lab Results  Component Value Date   TSH 2.642 06/29/2022   Last vitamin B12 and Folate Lab Results  Component Value Date   VITAMINB12 4,267 (H) 06/29/2022   FOLATE 8.1 11/27/2021     Assessment & Plan:   Problem List Items Addressed This Visit       Generalized anxiety disorder with panic attacks - Primary   Her acute concern today is increasing stress and anxiety.  She has a previously documented history of anxiety and panic attacks.  Currently prescribed Cymbalta  90 mg daily.  Anxiety is situational and largely attributed to losing her job and going through divorce.  Treatment options were reviewed today.  For now, she is interested in an as needed  medication for anxiety relief.  Hydroxyzine  25 mg as needed for anxiety relief prescribed today.  We will follow-up in 4 weeks for reassessment.  Multiple falls   Today she reports multiple falls since her last appointment.  Falls are not described as mechanical in nature.  She describes briefly blacking out prior to the falls but there are no preceding symptoms.  I believe a syncope workup is indicated based on her description of falls today.  Normal TTE noted in June 2024.  Cardiology follow-up is scheduled for tomorrow (1/3).  A message was sent by me to their office today.  Likely needs event monitor.       Return in about 4 weeks (around 02/19/2023).    Manus FORBES Fireman, MD

## 2023-01-22 NOTE — Assessment & Plan Note (Signed)
 Today she reports multiple falls since her last appointment.  Falls are not described as mechanical in nature.  She describes briefly blacking out prior to the falls but there are no preceding symptoms.  I believe a syncope workup is indicated based on her description of falls today.  Normal TTE noted in June 2024.  Cardiology follow-up is scheduled for tomorrow (1/3).  A message was sent by me to their office today.  Likely needs event monitor.

## 2023-01-22 NOTE — Patient Instructions (Signed)
 It was a pleasure to see you today.  Thank you for giving us  the opportunity to be involved in your care.  Below is a brief recap of your visit and next steps.  We will plan to see you again in 4 weeks.  Summary Add hydroxyzine  for as needed anxiety relief I will message your cardiology office about your recent falls Follow up in 4 weeks for reassessment

## 2023-01-22 NOTE — Assessment & Plan Note (Signed)
 Her acute concern today is increasing stress and anxiety.  She has a previously documented history of anxiety and panic attacks.  Currently prescribed Cymbalta  90 mg daily.  Anxiety is situational and largely attributed to losing her job and going through divorce.  Treatment options were reviewed today.  For now, she is interested in an as needed medication for anxiety relief.  Hydroxyzine  25 mg as needed for anxiety relief prescribed today.  We will follow-up in 4 weeks for reassessment.

## 2023-01-23 ENCOUNTER — Ambulatory Visit: Payer: BC Managed Care – PPO

## 2023-01-23 ENCOUNTER — Ambulatory Visit: Payer: BC Managed Care – PPO | Attending: Student | Admitting: Student

## 2023-01-23 ENCOUNTER — Encounter: Payer: Self-pay | Admitting: Student

## 2023-01-23 VITALS — BP 116/76 | HR 84 | Ht 62.0 in | Wt 150.0 lb

## 2023-01-23 DIAGNOSIS — R55 Syncope and collapse: Secondary | ICD-10-CM

## 2023-01-23 DIAGNOSIS — Z87898 Personal history of other specified conditions: Secondary | ICD-10-CM

## 2023-01-23 DIAGNOSIS — E785 Hyperlipidemia, unspecified: Secondary | ICD-10-CM | POA: Diagnosis not present

## 2023-01-23 DIAGNOSIS — R918 Other nonspecific abnormal finding of lung field: Secondary | ICD-10-CM

## 2023-01-23 NOTE — Patient Instructions (Signed)
 Medication Instructions:   Your physician recommends that you continue on your current medications as directed. Please refer to the Current Medication list given to you today.   Labwork: None today  Testing/Procedures: Schedule Non- Contrast Chest CT   ZIO XT- Long Term Monitor Instructions   Your physician has requested you wear your ZIO patch monitor_____14__days.   This is a single patch monitor.  [employer] supplies one patch monitor per enrollment.  Additional stickers are not available.   Please do not apply patch if you will be having a Nuclear Stress Test, Echocardiogram, Cardiac CT, MRI, or Chest Xray during the time frame you would be wearing the monitor. The patch cannot be worn during these tests.  You cannot remove and re-apply the ZIO XT patch monitor.       Do not shower for the first 24 hours.  You may shower after the first 24 hours.   Press button if you feel a symptom. You will hear a small click.  Record Date, Time and Symptom in the Patient Log Book.   When you are ready to remove patch, follow instructions on last 2 pages of Patient Log Book.  Stick patch monitor onto last page of Patient Log Book.   Place Patient Log Book in Vista box.  Use locking tab on box and tape box closed securely.  The [employer] has [employer] on it.  Please place in mailbox as soon as possible.  Your physician should have your test results approximately 7 days after the monitor has been mailed back to [facility].   Call [facility] Customer Care at [phone] if you have questions regarding your ZIO XT patch monitor.  Call them immediately if you see an orange light blinking on your monitor.   If your monitor falls off in less than 4 days contact our Monitor department at [phone].  If your monitor becomes loose or falls off after 4 days call [employer] at [phone] for suggestions on securing your monitor.    Follow-Up: 6 months with B.Strader,PA-C, or  Dr.Branch  Any Other Special Instructions Will Be Listed Below (If Applicable).  If you need a refill on your cardiac medications before your next appointment, please call your pharmacy.

## 2023-01-23 NOTE — Progress Notes (Signed)
 Cardiology Office Note    Date:  01/23/2023  ID:  [name], DOB 09-Jan-1971, MRN [id number] Cardiologist: Alvan Carrier, MD    History of Present Illness:    [name] is a 53 y.o. female with past medical history of chest pain, HLD, Type II DM and hypothyroidism who presents to the office today for 53-month follow-up.  She was examined by Dr. Alvan in 10/2022 and reported episodes of chest discomfort intermittently over the past few months which could last from 5 minutes up to an hour. Given her multiple cardiac risk factors, a Coronary CTA was recommended for further assessment. This showed a coronary calcium  score of 0. The radiology over read did show small pulmonary nodules measuring up to 6 mm with follow-up noncontrast Chest CT recommended in 3 to 6 months for reassessment.  She was evaluated by her PCP yesterday and reported multiple falls and described blacking out prior to the episodes. Her PCP did send a message to the office today for consideration of a monitor.  In talking the patient today, she reports still having occasional episodes of chest discomfort which can occur at rest or with activity. Breathing has overall been stable with no progressive dyspnea. No specific orthopnea, PND or pitting edema. She does report having 3 episodes of falling since 10/2022 and is unaware of any precipitating events. Says that she all of a sudden falls down and she does not feel that she fully loses consciousness but experiences near syncope with these. Episodes have occurred while she is walking. No association with positional changes. Reports her blood sugar has been stable during these episodes. No associated chest pain, palpitations, diaphoresis or bowel/bladder incontinence.  Studies Reviewed:   EKG: EKG is not ordered today.  Echocardiogram: 06/2022 IMPRESSIONS     1. Left ventricular ejection fraction, by estimation, is 65 to 70%. The  left ventricle has normal  function. The left ventricle has no regional  wall motion abnormalities. Left ventricular diastolic parameters were  normal.   2. Right ventricular systolic function is normal. The right ventricular  size is normal. Tricuspid regurgitation signal is inadequate for assessing  PA pressure.   3. The mitral valve is grossly normal. Trivial mitral valve  regurgitation. No evidence of mitral stenosis.   4. The aortic valve was not well visualized. Aortic valve regurgitation  is not visualized. No aortic stenosis is present.   5. The inferior vena cava is normal in size with greater than 50%  respiratory variability, suggesting right atrial pressure of 3 mmHg.   Coronary CTA: 10/2022 IMPRESSION: 1. Coronary calcium  score of 0. This was 1st percentile for age, sex, and race matched control.   2. Normal coronary origin with right dominance.   3. CAD-RADS 0. No evidence of CAD (0%). Consider non-atherosclerotic causes of chest pain.  IMPRESSION: 1. Indeterminate small pulmonary nodules. Largest pulmonary nodule measures 6 mm in left lower lobe. Non-contrast chest CT at 3-6 months is recommended. If the nodules are stable at time of repeat CT, then future CT at 18-24 months (from today's scan) is considered optional for low-risk patients, but is recommended for high-risk patients. This recommendation follows the consensus statement: Guidelines for Management of Incidental Pulmonary Nodules Detected on CT Images: From the Fleischner Society 2017; Radiology 2017; 284:228-243.   Physical Exam:   VS:  BP 116/76 (BP Location: Left Arm, Patient Position: Sitting, Cuff Size: Normal)   Pulse 84   Ht 5' 2 (1.575 m)  Wt 150 lb (68 kg)   SpO2 99%   BMI 27.44 kg/m    Wt Readings from Last 3 Encounters:  01/23/23 150 lb (68 kg)  01/22/23 150 lb (68 kg)  10/22/22 148 lb (67.1 kg)     GEN: Well nourished, well developed female appearing in no acute distress NECK: No JVD; No carotid  bruits CARDIAC: RRR, no murmurs, rubs, gallops RESPIRATORY:  Clear to auscultation without rales, wheezing or rhonchi  ABDOMEN: Appears non-distended. No obvious abdominal masses. EXTREMITIES: No clubbing or cyanosis. No pitting edema.  Distal pedal pulses are 2+ bilaterally.   Assessment and Plan:   1. History of Chest Pain - Reviewed her recent Coronary CTA in detail with her as this was reassuring and showed a coronary calcium  score of 0. Continue to focus on risk factor modification.  2. Near syncope - She describes three episodes of near syncope over the past few months which has led to her falling. By description, these overall seem noncardiac but difficult to discern given no precipitating events. She did have a reassuring echocardiogram and Coronary CTA within the past 6 months. Will obtain a 2 week Zio patch to assess for any significant arrhythmias.  3. HLD - Followed by her PCP. FLP in 06/2022 showed total cholesterol 173, triglycerides 71, HDL 63 and LDL 96. Continue current medical therapy with Crestor  5 mg daily.  4. Pulmonary Nodules - The CTA over-read did show pulmonary nodules with the largest measuring 6 mm and follow-up noncontrast Chest CT was recommended in 3 to 6 months. Will order this today.  Signed, Laymon CHRISTELLA Qua, PA-C

## 2023-02-05 ENCOUNTER — Other Ambulatory Visit (HOSPITAL_COMMUNITY): Payer: Self-pay

## 2023-02-05 ENCOUNTER — Telehealth: Payer: Self-pay

## 2023-02-05 NOTE — Telephone Encounter (Signed)
*  [facility]  Pharmacy Patient Advocate Encounter  Received notification from [facility] that Prior Authorization for Emgality 120MG /ML auto-injectors (migraine)  has been APPROVED from 02/05/2023 to 02/05/2024. Ran test claim, Copay is $35.00. This test claim was processed through [facility]- copay amounts may vary at other pharmacies due to pharmacy/plan contracts, or as the patient moves through the different stages of their insurance plan.   PA #/Case ID/Reference #: [id number]

## 2023-02-09 ENCOUNTER — Ambulatory Visit (HOSPITAL_COMMUNITY)
Admission: RE | Admit: 2023-02-09 | Discharge: 2023-02-09 | Disposition: A | Discharge status: Home / Self Care | Payer: BC Managed Care – PPO | Source: Ambulatory Visit | Attending: Student | Admitting: Student

## 2023-02-09 DIAGNOSIS — R918 Other nonspecific abnormal finding of lung field: Secondary | ICD-10-CM | POA: Insufficient documentation

## 2023-02-09 DIAGNOSIS — I700 Atherosclerosis of aorta: Secondary | ICD-10-CM | POA: Diagnosis not present

## 2023-02-10 DIAGNOSIS — E113592 Type 2 diabetes mellitus with proliferative diabetic retinopathy without macular edema, left eye: Secondary | ICD-10-CM | POA: Diagnosis not present

## 2023-02-10 DIAGNOSIS — E113511 Type 2 diabetes mellitus with proliferative diabetic retinopathy with macular edema, right eye: Secondary | ICD-10-CM | POA: Diagnosis not present

## 2023-02-10 DIAGNOSIS — H31013 Macula scars of posterior pole (postinflammatory) (post-traumatic), bilateral: Secondary | ICD-10-CM | POA: Diagnosis not present

## 2023-02-10 DIAGNOSIS — H43822 Vitreomacular adhesion, left eye: Secondary | ICD-10-CM | POA: Diagnosis not present

## 2023-02-20 ENCOUNTER — Telehealth (INDEPENDENT_AMBULATORY_CARE_PROVIDER_SITE_OTHER): Payer: BC Managed Care – PPO | Admitting: Internal Medicine

## 2023-02-20 ENCOUNTER — Encounter: Payer: Self-pay | Admitting: Internal Medicine

## 2023-02-20 DIAGNOSIS — F411 Generalized anxiety disorder: Secondary | ICD-10-CM | POA: Diagnosis not present

## 2023-02-20 DIAGNOSIS — F410 Panic disorder [episodic paroxysmal anxiety] without agoraphobia: Secondary | ICD-10-CM | POA: Diagnosis not present

## 2023-02-20 NOTE — Assessment & Plan Note (Signed)
Evaluated today through video encounter for follow-up of anxiety with panic attacks.  She endorsed worsening stress and anxiety at her appointment earlier this month, which is largely related to losing her job and going through divorce.  She is currently prescribed Cymbalta 90 mg daily and hydroxyzine 25 mg as needed for anxiety relief was added.  She reports that hydroxyzine has been effective.  She is not interested in making any additional medication changes at this time.  We will tentatively plan for routine follow-up in 3 months.

## 2023-02-20 NOTE — Progress Notes (Signed)
Virtual Visit via Video Note  I connected with [name] on 02/20/23 at  2:40 PM EST by a video enabled telemedicine application and verified that I am speaking with the correct person using two identifiers.  Patient Location: Home Provider Location: Office/Clinic  I discussed the limitations, risks, security, and privacy concerns of performing an evaluation and management service by video and the availability of in person appointments. I also discussed with the patient that there may be a patient responsible charge related to this service. The patient expressed understanding and agreed to proceed.  Subjective: PCP: [name] Lade, MD  Chief Complaint  Patient presents with   Anxiety    Four week follow up   [name] has been evaluated through video encounter today for follow up of anxiety with panic attacks.  She was last evaluated by me on 1/2 at which time she endorsed increasing stress and anxiety.  She has a previously documented history of anxiety with panic attacks.  Anxiety was described as situational and largely attributed to losing her job and going through a divorce.  Hydroxyzine was added for as needed anxiety relief.  Today she reports feeling well.  Hydroxyzine has been effective in alleviating anxiety, particularly at night when she has trouble sleeping.  She is not interested in making any additional medication changes today.  ROS: Per HPI  Current Outpatient Medications:    aspirin EC 81 MG tablet, Take 1 tablet (81 mg total) by mouth daily. Swallow whole., Disp: 30 tablet, Rfl: 12   cholecalciferol (VITAMIN D3) 25 MCG (1000 UNIT) tablet, Take 1,000 Units by mouth daily., Disp: , Rfl:    Continuous Glucose Sensor (DEXCOM G7 SENSOR) MISC, APPLY ONE SENSOR EVERY 10 DAYS AS DIRECTED., Disp: , Rfl:    diclofenac (VOLTAREN) 75 MG EC tablet, Take 1 tablet (75 mg total) by mouth daily., Disp: 90 tablet, Rfl: 0   DULoxetine (CYMBALTA) 60 MG capsule, Take 1  capsule (60 mg total) by mouth daily., Disp: 30 capsule, Rfl: 2   Galcanezumab-gnlm (EMGALITY) 120 MG/ML SOAJ, Inject 1 Pen into the skin every 30 (thirty) days., Disp: 1.12 mL, Rfl: 6   Glucose Blood (ACCU-CHEK GUIDE TEST VI), as directed In Vitro 4/day, Disp: , Rfl:    hydrOXYzine (VISTARIL) 25 MG capsule, Take 1 capsule (25 mg total) by mouth every 8 (eight) hours as needed., Disp: 30 capsule, Rfl: 0   insulin aspart (NOVOLOG) 100 UNIT/ML injection, by Pump Prime route continuous., Disp: , Rfl:    insulin lispro (HUMALOG) 100 UNIT/ML injection, INJECT UP TO 150 UNITS DAILY AS DIRECTED VIA INSULIN PUMP., Disp: , Rfl:    levothyroxine (SYNTHROID) 75 MCG tablet, Take 75 mcg by mouth daily., Disp: , Rfl:    Rimegepant Sulfate (NURTEC) 75 MG TBDP, Take 1 tablet (75 mg total) by mouth as needed (for migraine)., Disp: 8 tablet, Rfl: 6   rosuvastatin (CRESTOR) 5 MG tablet, Take 1 tablet (5 mg total) by mouth daily., Disp: 30 tablet, Rfl: 2   topiramate (TOPAMAX) 50 MG tablet, Take 1 tablet (50 mg total) by mouth 2 (two) times daily. (Patient taking differently: Take 25 mg by mouth 2 (two) times daily.), Disp: 60 tablet, Rfl: 2   Vitamin D, Ergocalciferol, (DRISDOL) 1.25 MG (50000 UNIT) CAPS capsule, Take 50,000 Units by mouth every Wednesday., Disp: , Rfl:   Assessment and Plan:  Generalized anxiety disorder with panic attacks Assessment & Plan: Evaluated today through video encounter for follow-up of anxiety with  panic attacks.  She endorsed worsening stress and anxiety at her appointment earlier this month, which is largely related to losing her job and going through divorce.  She is currently prescribed Cymbalta 90 mg daily and hydroxyzine 25 mg as needed for anxiety relief was added.  She reports that hydroxyzine has been effective.  She is not interested in making any additional medication changes at this time.  We will tentatively plan for routine follow-up in 3 months.  Follow Up  Instructions: Return in about 3 months (around 05/20/2023).   I discussed the assessment and treatment plan with the patient. The patient was provided an opportunity to ask questions, and all were answered. The patient agreed with the plan and demonstrated an understanding of the instructions.   The patient was advised to call back or seek an in-person evaluation if the symptoms worsen or if the condition fails to improve as anticipated.  The above assessment and management plan was discussed with the patient. The patient verbalized understanding of and has agreed to the management plan.   [name] Lade, MD

## 2023-02-26 ENCOUNTER — Other Ambulatory Visit: Payer: Self-pay | Admitting: Internal Medicine

## 2023-02-26 DIAGNOSIS — F410 Panic disorder [episodic paroxysmal anxiety] without agoraphobia: Secondary | ICD-10-CM

## 2023-02-26 DIAGNOSIS — M797 Fibromyalgia: Secondary | ICD-10-CM

## 2023-03-13 ENCOUNTER — Other Ambulatory Visit: Payer: Self-pay | Admitting: Internal Medicine

## 2023-03-13 DIAGNOSIS — F410 Panic disorder [episodic paroxysmal anxiety] without agoraphobia: Secondary | ICD-10-CM

## 2023-03-13 DIAGNOSIS — M797 Fibromyalgia: Secondary | ICD-10-CM

## 2023-03-13 MED ORDER — DULOXETINE HCL 60 MG PO CPEP: 60.0000 mg | ORAL_CAPSULE | Freq: Every day | ORAL | 2 refills | Status: DC

## 2023-03-13 NOTE — Telephone Encounter (Signed)
Copied from CRM [id number]. Topic: Clinical - Medication Refill >> Mar 13, 2023  9:10 AM Clayton Bibles wrote: Most Recent Primary Care Visit:  Provider: Christel Mormon E  Department: [facility]  Visit Type: MYCHART VIDEO VISIT  Date: 02/20/2023  Medication: DULoxetine (CYMBALTA) 60 MG capsule  Has the patient contacted their pharmacy? No (Agent: If no, request that the patient contact the pharmacy for the refill. If patient does not wish to contact the pharmacy document the reason why and proceed with request.) (Agent: If yes, when and what did the pharmacy advise?)  Is this the correct pharmacy for this prescription? Yes If no, delete pharmacy and type the correct one.  This is the patient's preferred pharmacy:  [facility] [address] [place] - [address] [place] [address] [place] [address] Phone: [phone] Fax: [phone]   Has the prescription been filled recently? No  Is the patient out of the medication? Yes She took last pill last night  Has the patient been seen for an appointment in the last year OR does the patient have an upcoming appointment? Yes  Can we respond through MyChart? Yes Please send a message when complete  Agent: Please be advised that Rx refills may take up to 3 business days. We ask that you follow-up with your pharmacy.

## 2023-04-30 ENCOUNTER — Other Ambulatory Visit (HOSPITAL_COMMUNITY): Payer: Self-pay

## 2023-04-30 ENCOUNTER — Telehealth: Payer: Self-pay

## 2023-04-30 NOTE — Telephone Encounter (Signed)
 Pharmacy Patient Advocate Encounter   Received notification from Fax that prior authorization for Emgality 120MG /ML auto-injectors (migraine) is required/requested.   Insurance verification completed.   The patient is insured through [employer] .   Per test claim: PA required; PA submitted to above mentioned insurance via CoverMyMeds Key/confirmation #/EOC [facility] Status is pending

## 2023-05-05 NOTE — Telephone Encounter (Signed)
 Pharmacy Patient Advocate Encounter  Received notification from [employer] that Prior Authorization for Emgality 120MG /ML auto-injectors (migraine) has been APPROVED from 04/30/2023 to 04/30/2024   PA #/Case ID/Reference #: [id number]

## 2023-05-28 ENCOUNTER — Ambulatory Visit: Payer: BC Managed Care – PPO | Admitting: Internal Medicine

## 2023-06-17 ENCOUNTER — Ambulatory Visit: Admitting: Internal Medicine

## 2023-06-18 NOTE — Telephone Encounter (Signed)
 Appointment scheduled.

## 2023-07-01 ENCOUNTER — Ambulatory Visit: Payer: BC Managed Care – PPO | Admitting: Cardiology

## 2023-08-03 ENCOUNTER — Ambulatory Visit

## 2023-08-07 ENCOUNTER — Other Ambulatory Visit (HOSPITAL_COMMUNITY): Payer: Self-pay

## 2023-08-07 ENCOUNTER — Telehealth: Payer: Self-pay

## 2023-08-07 NOTE — Telephone Encounter (Signed)
 Pharmacy Patient Advocate Encounter  Nurtec 75MG  dispersible tablets Received notification from Fax that prior authorization for Nurtec 75MG  dispersible tablets is due for renewal.   Insurance verification completed.   The patient is insured through [employer].  Action: Patient hasn't been seen in your office in over a year. Plan requires updated chart notes for PA renewal.

## 2023-08-16 IMAGING — DX DG WRIST COMPLETE 3+V*R*
4 series · 4 of 4 positions shown · non-contrast
Comparison: None Available.

CLINICAL DATA: Pain in lateral aspect wrist after fall 2 weeks ago.

EXAM:
RIGHT WRIST - COMPLETE 3+ VIEW

[wrist pa]
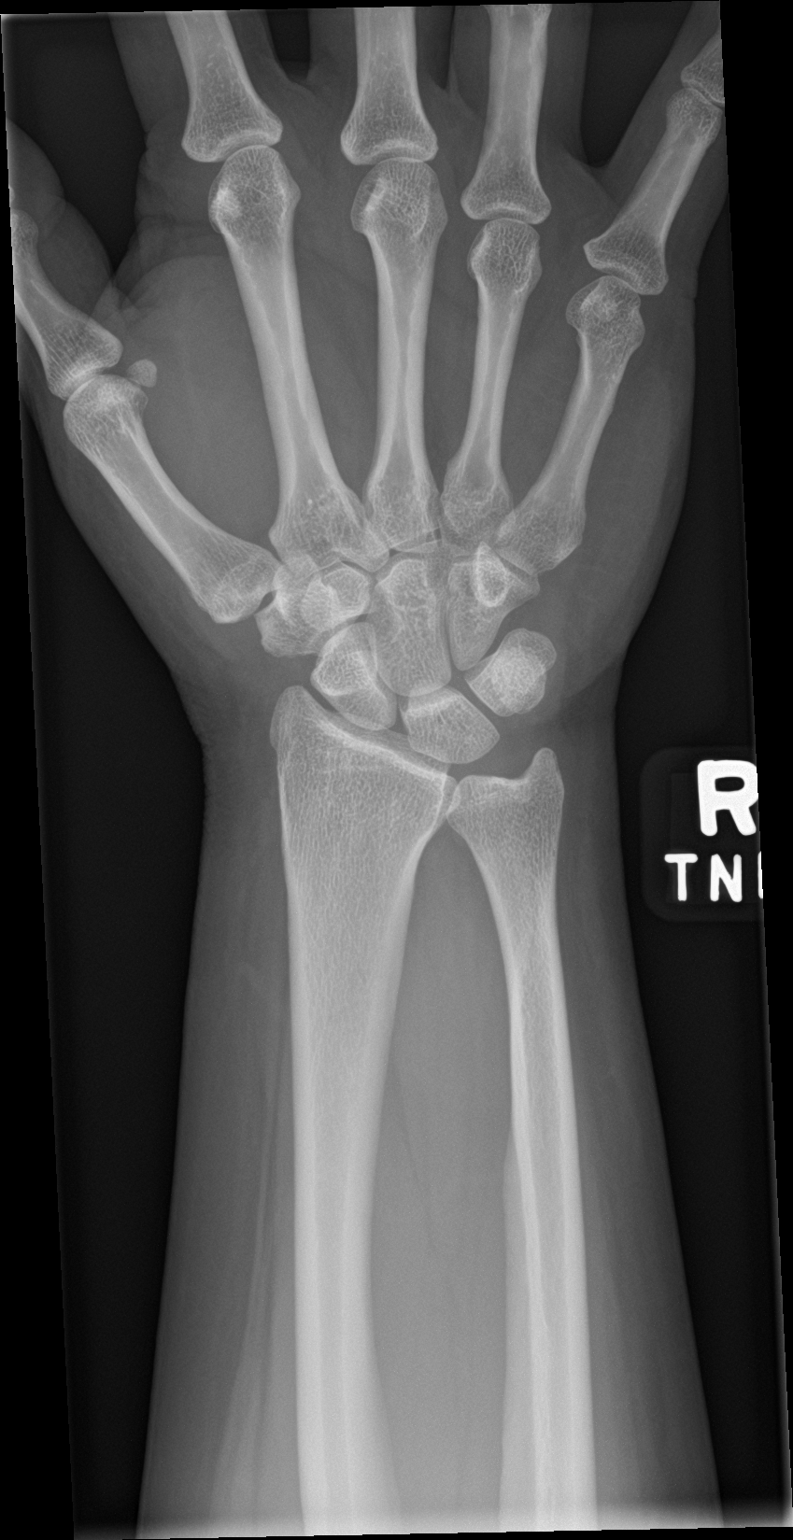

[wrist obl]
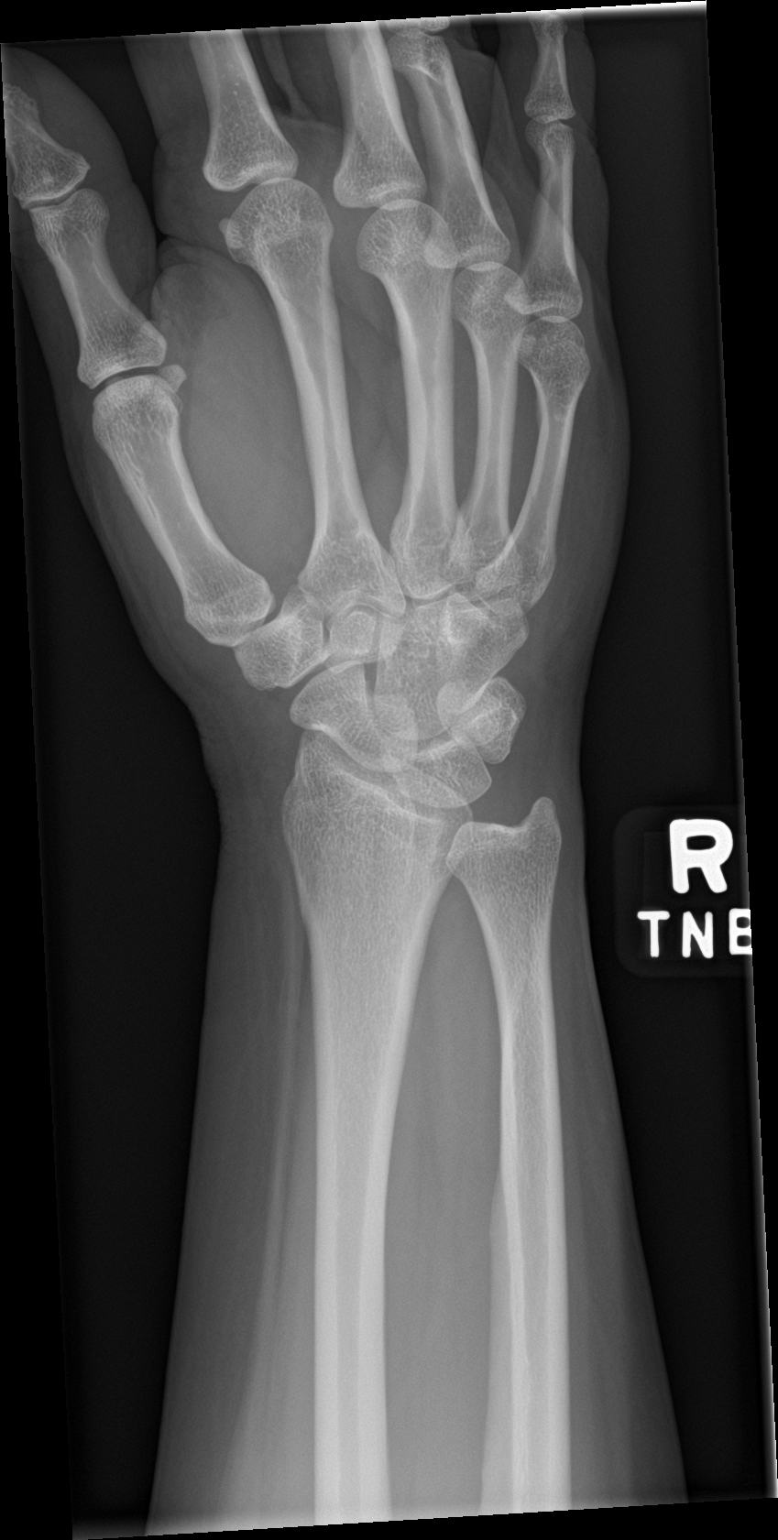

[wrist lat]
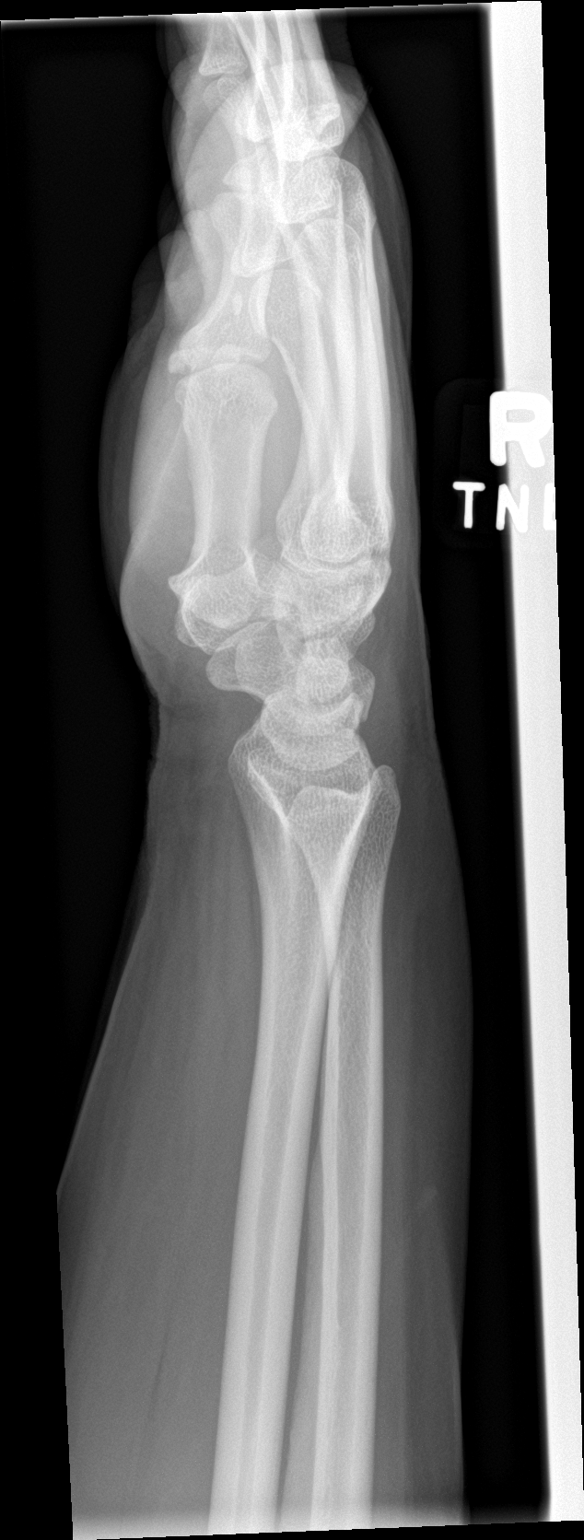

[wrist navicular]
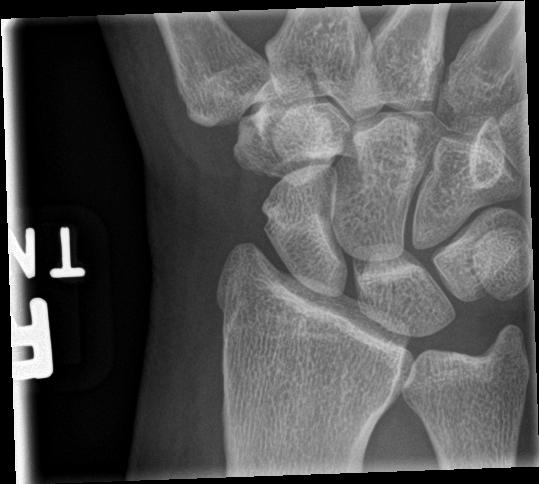

[4 of 4 positions shown; findings below may reference images not displayed]

FINDINGS: There is no evidence of fracture or dislocation. There is no
evidence of arthropathy or other focal bone abnormality. Soft
tissues are unremarkable.
IMPRESSION: Negative.

## 2023-09-02 ENCOUNTER — Other Ambulatory Visit: Payer: Self-pay

## 2023-09-02 ENCOUNTER — Emergency Department (HOSPITAL_COMMUNITY)
Admission: EM | Admit: 2023-09-02 | Discharge: 2023-09-02 | Disposition: A | Discharge status: Home / Self Care | Attending: Emergency Medicine | Admitting: Emergency Medicine

## 2023-09-02 ENCOUNTER — Other Ambulatory Visit (HOSPITAL_COMMUNITY): Payer: Self-pay

## 2023-09-02 ENCOUNTER — Encounter (HOSPITAL_COMMUNITY): Payer: Self-pay | Admitting: Emergency Medicine

## 2023-09-02 ENCOUNTER — Emergency Department (HOSPITAL_COMMUNITY)

## 2023-09-02 ENCOUNTER — Telehealth (HOSPITAL_COMMUNITY): Payer: Self-pay | Admitting: Pharmacy Technician

## 2023-09-02 DIAGNOSIS — Y92410 Unspecified street and highway as the place of occurrence of the external cause: Secondary | ICD-10-CM | POA: Diagnosis not present

## 2023-09-02 DIAGNOSIS — R55 Syncope and collapse: Secondary | ICD-10-CM | POA: Diagnosis present

## 2023-09-02 DIAGNOSIS — Z7982 Long term (current) use of aspirin: Secondary | ICD-10-CM | POA: Insufficient documentation

## 2023-09-02 DIAGNOSIS — E1165 Type 2 diabetes mellitus with hyperglycemia: Secondary | ICD-10-CM | POA: Diagnosis not present

## 2023-09-02 DIAGNOSIS — Z794 Long term (current) use of insulin: Secondary | ICD-10-CM | POA: Insufficient documentation

## 2023-09-02 DIAGNOSIS — Z79899 Other long term (current) drug therapy: Secondary | ICD-10-CM | POA: Insufficient documentation

## 2023-09-02 DIAGNOSIS — E039 Hypothyroidism, unspecified: Secondary | ICD-10-CM | POA: Insufficient documentation

## 2023-09-02 LAB — BLOOD GAS, VENOUS
Acid-base deficit: 5.9 mmol/L — ABNORMAL HIGH (ref 0.0–2.0)
Bicarbonate: 20.2 mmol/L (ref 20.0–28.0)
Drawn by: 1718
O2 Saturation: 50.1 %
Patient temperature: 36.9
pCO2, Ven: 41 mmHg — ABNORMAL LOW (ref 44–60)
pH, Ven: 7.3 (ref 7.25–7.43)
pO2, Ven: 33 mmHg (ref 32–45)

## 2023-09-02 LAB — BETA-HYDROXYBUTYRIC ACID: Beta-Hydroxybutyric Acid: 0.41 mmol/L — ABNORMAL HIGH (ref 0.05–0.27)

## 2023-09-02 LAB — URINALYSIS, ROUTINE W REFLEX MICROSCOPIC
Bacteria, UA: NONE SEEN
Bilirubin Urine: NEGATIVE
Glucose, UA: >=500 mg/dL — AB
Ketones, ur: NEGATIVE mg/dL
Nitrite: NEGATIVE
Protein, ur: NEGATIVE mg/dL
Specific Gravity, Urine: 1.008 (ref 1.005–1.030)
pH: 6 (ref 5.0–8.0)

## 2023-09-02 LAB — CBC
HCT: 41.1 % (ref 36.0–46.0)
Hemoglobin: 13.9 g/dL (ref 12.0–15.0)
MCH: 33.7 pg (ref 26.0–34.0)
MCHC: 33.8 g/dL (ref 30.0–36.0)
MCV: 99.8 fL (ref 80.0–100.0)
Platelets: 233 K/uL (ref 150–400)
RBC: 4.12 MIL/uL (ref 3.87–5.11)
RDW: 13.1 % (ref 11.5–15.5)
WBC: 6.4 K/uL (ref 4.0–10.5)
nRBC: 0 % (ref 0.0–0.2)

## 2023-09-02 LAB — BASIC METABOLIC PANEL WITH GFR
Anion gap: 11 (ref 5–15)
BUN: 6 mg/dL (ref 6–20)
CO2: 19 mmol/L — ABNORMAL LOW (ref 22–32)
Calcium: 8.8 mg/dL — ABNORMAL LOW (ref 8.9–10.3)
Chloride: 108 mmol/L (ref 98–111)
Creatinine, Ser: 0.64 mg/dL (ref 0.44–1.00)
GFR, Estimated: >60 mL/min (ref >60)
Glucose, Bld: 395 mg/dL — ABNORMAL HIGH (ref 70–99)
Potassium: 3.3 mmol/L — ABNORMAL LOW (ref 3.5–5.1)
Sodium: 138 mmol/L (ref 135–145)

## 2023-09-02 LAB — CBG MONITORING, ED
Glucose-Capillary: 369 mg/dL — ABNORMAL HIGH (ref 70–99)
Glucose-Capillary: 265 mg/dL — ABNORMAL HIGH (ref 70–99)

## 2023-09-02 MED ORDER — SODIUM CHLORIDE 0.9 % IV BOLUS
1000.0000 mL | Freq: Once | INTRAVENOUS | Status: AC
  Administered 2023-09-02 (×2): 1000 mL via INTRAVENOUS

## 2023-09-02 NOTE — ED Notes (Signed)
 Pts daughter asks to be called if she is admitted or updated.
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
## 2023-09-02 NOTE — Telephone Encounter (Signed)
 Patient Product/process development scientist completed.    The patient is insured through [employer]. Patient has [employer], may use a copay card, and/or apply for patient assistance if available.    Ran test claim for Basaglar Pen and the current 30 day co-pay is $25.00.  Ran test claim for Humalog KwikPen and the current 30 day co-pay is $25.00.  Ran test claim for Dexcom G7 Sensor and the current 30 day co-pay is $357.09 due to a $6500 deductible  Ran test claim for Freestyle Libre 3 Plus Sensor and the current 30 day co-pay is $140.89 due to a $6500 deductible  This test claim was processed through [facility]- copay amounts may vary at other pharmacies due to [employer], or as the patient moves through the different stages of their insurance plan.     Reyes Sharps, CPHT Pharmacy Technician III Certified Patient Advocate [facility] Pharmacy Patient Advocate Team Direct Number: [phone]  Fax: [phone]

## 2023-09-02 NOTE — Group Note (Deleted)
 Date:  09/02/2023 Time:  2:22 PM  Group Topic/Focus:  Wellness Toolbox:   The focus of this group is to discuss various aspects of wellness, balancing those aspects and exploring ways to increase the ability to experience wellness.  Patients will create a wellness toolbox for use upon discharge.     Participation Level:  {BHH PARTICIPATION OZCZO:77735}  Participation Quality:  {BHH PARTICIPATION QUALITY:22265}  Affect:  {BHH AFFECT:22266}  Cognitive:  {BHH COGNITIVE:22267}  Insight: {BHH Insight2:20797}  Engagement in Group:  {BHH ENGAGEMENT IN HMNLE:77731}  Modes of Intervention:  {BHH MODES OF INTERVENTION:22269}  Additional Comments:  ***  [name] 09/02/2023, 2:22 PM

## 2023-09-02 NOTE — Discharge Instructions (Addendum)
 We evaluated you for your episode of fainting.  We do not know the exact cause of your episode of fainting.  Your testing in the emergency department was overall very reassuring.  Your blood sugar was elevated.  Please discuss this with your endocrinologist to determine whether any medication adjustments need to be made.  We do not think you need to stay in the hospital, but we did recommend following up very closely with your primary care doctor.  Please be sure you are taking your medications as prescribed.  Since we do not know the exact cause of your episode of fainting, we would recommend that you do not drive at least 6 months after you are free from further episodes of fainting.  If you do have any other new symptoms such as recurrent episodes of fainting, seizure or shaking activity, chest pain, difficulty breathing, racing heartbeat, or any other new symptoms, please immediately return to the emergency department
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
## 2023-09-02 NOTE — ED Notes (Signed)
 Patient transported to CT

## 2023-09-11 ENCOUNTER — Ambulatory Visit (INDEPENDENT_AMBULATORY_CARE_PROVIDER_SITE_OTHER)

## 2023-09-11 VITALS — BP 120/84 | HR 102 | Resp 18 | Ht 62.0 in | Wt 147.1 lb

## 2023-09-11 DIAGNOSIS — F410 Panic disorder [episodic paroxysmal anxiety] without agoraphobia: Secondary | ICD-10-CM | POA: Diagnosis not present

## 2023-09-11 DIAGNOSIS — M25531 Pain in right wrist: Secondary | ICD-10-CM

## 2023-09-11 DIAGNOSIS — G43009 Migraine without aura, not intractable, without status migrainosus: Secondary | ICD-10-CM | POA: Diagnosis not present

## 2023-09-11 DIAGNOSIS — M797 Fibromyalgia: Secondary | ICD-10-CM

## 2023-09-11 MED ORDER — TOPIRAMATE 25 MG PO TABS: 25.0000 mg | ORAL_TABLET | Freq: Two times a day (BID) | ORAL | 3 refills | Status: AC

## 2023-09-11 MED ORDER — DULOXETINE HCL 60 MG PO CPEP: 60.0000 mg | ORAL_CAPSULE | Freq: Every day | ORAL | 3 refills | Status: AC

## 2023-09-11 MED ORDER — DICLOFENAC SODIUM 75 MG PO TBEC: 75.0000 mg | DELAYED_RELEASE_TABLET | Freq: Every day | ORAL | 3 refills | Status: AC | PRN
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
## 2023-10-07 ENCOUNTER — Ambulatory Visit: Admitting: Cardiology
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
## 2023-10-16 ENCOUNTER — Other Ambulatory Visit: Payer: Self-pay

## 2023-10-19 ENCOUNTER — Other Ambulatory Visit: Payer: Self-pay

## 2023-12-05 ENCOUNTER — Emergency Department (HOSPITAL_COMMUNITY)
Admission: EM | Admit: 2023-12-05 | Discharge: 2023-12-05 | Disposition: A | Discharge status: Home / Self Care | Attending: Emergency Medicine | Admitting: Emergency Medicine

## 2023-12-05 ENCOUNTER — Encounter (HOSPITAL_COMMUNITY): Payer: Self-pay

## 2023-12-05 ENCOUNTER — Emergency Department (HOSPITAL_COMMUNITY)

## 2023-12-05 DIAGNOSIS — Z794 Long term (current) use of insulin: Secondary | ICD-10-CM | POA: Insufficient documentation

## 2023-12-05 DIAGNOSIS — E109 Type 1 diabetes mellitus without complications: Secondary | ICD-10-CM | POA: Insufficient documentation

## 2023-12-05 DIAGNOSIS — R519 Headache, unspecified: Secondary | ICD-10-CM | POA: Insufficient documentation

## 2023-12-05 DIAGNOSIS — Z7982 Long term (current) use of aspirin: Secondary | ICD-10-CM | POA: Diagnosis not present

## 2023-12-05 DIAGNOSIS — Z8673 Personal history of transient ischemic attack (TIA), and cerebral infarction without residual deficits: Secondary | ICD-10-CM | POA: Diagnosis not present

## 2023-12-05 DIAGNOSIS — R0789 Other chest pain: Secondary | ICD-10-CM | POA: Diagnosis not present

## 2023-12-05 LAB — CBC WITH DIFFERENTIAL/PLATELET
Abs Immature Granulocytes: 0.01 K/uL (ref 0.00–0.07)
Basophils Absolute: 0 K/uL (ref 0.0–0.1)
Basophils Relative: 1 %
Eosinophils Absolute: 0.1 K/uL (ref 0.0–0.5)
Eosinophils Relative: 2 %
HCT: 39.3 % (ref 36.0–46.0)
Hemoglobin: 13.4 g/dL (ref 12.0–15.0)
Immature Granulocytes: 0 %
Lymphocytes Relative: 30 %
Lymphs Abs: 1.7 K/uL (ref 0.7–4.0)
MCH: 33 pg (ref 26.0–34.0)
MCHC: 34.1 g/dL (ref 30.0–36.0)
MCV: 96.8 fL (ref 80.0–100.0)
Monocytes Absolute: 0.5 K/uL (ref 0.1–1.0)
Monocytes Relative: 9 %
Neutro Abs: 3.4 K/uL (ref 1.7–7.7)
Neutrophils Relative %: 58 %
Platelets: 287 K/uL (ref 150–400)
RBC: 4.06 MIL/uL (ref 3.87–5.11)
RDW: 13.4 % (ref 11.5–15.5)
WBC: 5.8 K/uL (ref 4.0–10.5)
nRBC: 0 % (ref 0.0–0.2)

## 2023-12-05 LAB — I-STAT CHEM 8, ED
BUN: 8 mg/dL (ref 6–20)
Calcium, Ion: 1.2 mmol/L (ref 1.15–1.40)
Chloride: 105 mmol/L (ref 98–111)
Creatinine, Ser: 0.5 mg/dL (ref 0.44–1.00)
Glucose, Bld: 150 mg/dL — ABNORMAL HIGH (ref 70–99)
HCT: 39 % (ref 36.0–46.0)
Hemoglobin: 13.3 g/dL (ref 12.0–15.0)
Potassium: 3.8 mmol/L (ref 3.5–5.1)
Sodium: 142 mmol/L (ref 135–145)
TCO2: 23 mmol/L (ref 22–32)

## 2023-12-05 LAB — CBG MONITORING, ED
Glucose-Capillary: 148 mg/dL — ABNORMAL HIGH (ref 70–99)
Glucose-Capillary: 93 mg/dL (ref 70–99)

## 2023-12-05 LAB — TROPONIN T, HIGH SENSITIVITY
Troponin T High Sensitivity: <15 ng/L (ref 0–19)
Troponin T High Sensitivity: <15 ng/L (ref 0–19)

## 2023-12-05 LAB — MAGNESIUM: Magnesium: 2.1 mg/dL (ref 1.7–2.4)

## 2023-12-05 NOTE — ED Provider Notes (Signed)
 [place] EMERGENCY DEPARTMENT AT [facility] Provider Note   CSN: [id number] Arrival date & time: 12/05/23  1653     Patient presents with: Chest Pain and Headache   [name] is a 53 y.o. female.   Patient complains of feeling off today.  Patient reports that she is type I diabetic.  Patient has an insulin  pump.  Patient states today she has had a headache and she has had some discomfort in the center of her chest.  Patient reports that she has had a previous TIA.  Patient states that she has had episodes of confusion with a TIA.  Patient states that she does not have any residual weakness.  Patient states that she is not having any weakness today she has not had any facial numbness she has not had any vision changes no hearing changes.  Patient has not had any weakness in her arms or her legs.  Patient states that she has had pain in the middle of her chest.  She reports she has been seen by cardiologist in the past for a heart workup after her TIA.  Patient states that she was not told that there was anything abnormal.   Chest Pain Associated symptoms: headache   Headache      Prior to Admission medications   Medication Sig Start Date End Date Taking? Authorizing Provider  aspirin  EC 81 MG tablet Take 1 tablet (81 mg total) by mouth daily. Swallow whole. 06/30/22   Krishnan, Gokul, MD  cholecalciferol  (VITAMIN D3) 25 MCG (1000 UNIT) tablet Take 1,000 Units by mouth daily.    [provider]  Continuous Glucose Sensor (DEXCOM G7 SENSOR) MISC APPLY ONE SENSOR EVERY 10 DAYS AS DIRECTED.    [provider]  diclofenac  (VOLTAREN ) 75 MG EC tablet Take 1 tablet (75 mg total) by mouth daily as needed for moderate pain (pain score 4-6). 09/11/23   Bevely Doffing, FNP  DULoxetine  (CYMBALTA ) 60 MG capsule Take 1 capsule (60 mg total) by mouth daily. 09/11/23 09/10/24  Bevely Doffing, FNP  Galcanezumab -gnlm (EMGALITY ) 120 MG/ML SOAJ Inject 1 Pen into the  skin every 30 (thirty) days. 08/06/22   Rush Nest, MD  Glucose Blood (ACCU-CHEK GUIDE TEST VI) as directed In Vitro 4/day 04/02/18   [provider]  HUMULIN N 100 UNIT/ML injection Inject 12 Units into the skin 2 (two) times daily with breakfast and lunch. 08/11/23   [provider]  insulin  aspart (NOVOLOG ) 100 UNIT/ML injection by Pump Prime route continuous.    [provider]  insulin  lispro (HUMALOG) 100 UNIT/ML injection INJECT UP TO 150 UNITS DAILY AS DIRECTED VIA INSULIN  PUMP. 10/06/22   [provider]  levothyroxine  (SYNTHROID ) 75 MCG tablet Take 75 mcg by mouth daily.    [provider]  Rimegepant Sulfate (NURTEC) 75 MG TBDP Take 1 tablet (75 mg total) by mouth as needed (for migraine). 08/06/22   Rush Nest, MD  rosuvastatin  (CRESTOR ) 5 MG tablet Take 1 tablet (5 mg total) by mouth daily. 06/29/22   Krishnan, Gokul, MD  topiramate  (TOPAMAX ) 25 MG tablet Take 1 tablet (25 mg total) by mouth 2 (two) times daily. 09/11/23   Bevely Doffing, FNP  Vitamin D , Ergocalciferol , (DRISDOL) 1.25 MG (50000 UNIT) CAPS capsule Take 50,000 Units by mouth every Wednesday. 12/12/19   [provider]  simvastatin (ZOCOR) 10 MG tablet Take 10 mg by mouth daily. Dr. Balan  01/16/19  [provider]    Allergies: Patient  has no known allergies.    Review of Systems  Cardiovascular:  Positive for chest pain.  Neurological:  Positive for headaches.  All other systems reviewed and are negative.   Updated Vital Signs BP (!) 149/92 (BP Location: Left Arm)   Pulse (!) 109   Temp 98.5 F (36.9 C) (Oral)   Resp 19   Ht 5' 2 (1.575 m)   Wt 65.8 kg   SpO2 97%   BMI 26.52 kg/m   Physical Exam Vitals and nursing note reviewed.  Constitutional:      Appearance: She is well-developed.  HENT:     Head: Normocephalic.  Cardiovascular:     Rate and Rhythm: Normal rate.     Heart sounds: Normal heart sounds.  Pulmonary:     Effort:  Pulmonary effort is normal.     Breath sounds: Normal breath sounds.  Abdominal:     General: Bowel sounds are normal. There is no distension.     Palpations: Abdomen is soft.  Musculoskeletal:        General: Normal range of motion.     Cervical back: Normal range of motion.  Skin:    General: Skin is warm.  Neurological:     General: No focal deficit present.     Mental Status: She is alert and oriented to person, place, and time.  Psychiatric:        Mood and Affect: Mood normal.     (all labs ordered are listed, but only abnormal results are displayed) Labs Reviewed  I-STAT CHEM 8, ED - Abnormal; Notable for the following components:      Result Value   Glucose, Bld 150 (*)    All other components within normal limits  CBG MONITORING, ED - Abnormal; Notable for the following components:   Glucose-Capillary 148 (*)    All other components within normal limits  CBC WITH DIFFERENTIAL/PLATELET  MAGNESIUM  CBG MONITORING, ED  TROPONIN T, HIGH SENSITIVITY  TROPONIN T, HIGH SENSITIVITY    EKG: EKG Interpretation Date/Time:  Saturday December 05 2023 17:04:37 EST Ventricular Rate:  106 PR Interval:  146 QRS Duration:  78 QT Interval:  332 QTC Calculation: 441 R Axis:   6  Text Interpretation: Sinus tachycardia Anterior infarct, old No significant change since prior 8/25 Confirmed by Towana Sharper [phone]) on 12/05/2023 5:17:09 PM  Radiology: CT Head Wo Contrast Result Date: 12/05/2023 CLINICAL DATA:  Left frontal headache. EXAM: CT HEAD WITHOUT CONTRAST TECHNIQUE: Contiguous axial images were obtained from the base of the skull through the vertex without intravenous contrast. RADIATION DOSE REDUCTION: This exam was performed according to the departmental dose-optimization program which includes automated exposure control, adjustment of the mA and/or kV according to patient size and/or use of iterative reconstruction technique. COMPARISON:  September 02, 2023 FINDINGS:  Brain: No evidence of acute infarction, hemorrhage, hydrocephalus, extra-axial collection or mass lesion/mass effect. Stable, symmetric bilateral thalamic mineralization is noted. Vascular: No hyperdense vessel or unexpected calcification. Skull: Normal. Negative for fracture or focal lesion. Sinuses/Orbits: Postoperative changes are seen involving the right lens. Other: None. IMPRESSION: No acute intracranial abnormality. Electronically Signed   By: Suzen Dials M.D.   On: 12/05/2023 18:48     Procedures   Medications Ordered in the ED - No data to display                                  Medical  Decision Making Patient complains of experiencing a headache and chest pain today.  Patient reports she has had a TIA in the past.  Patient reports she is not currently having any chest pain.  Patient was concerned and wanted to come get checked out.  Amount and/or Complexity of Data Reviewed Labs: ordered. Decision-making details documented in ED Course.    Details: Labs ordered reviewed and interpreted troponin is negative Radiology: ordered and independent interpretation performed. Decision-making details documented in ED Course.    Details: CT head ordered reviewed and interpreted CT head shows no acute findings. ECG/medicine tests: ordered and independent interpretation performed. Decision-making details documented in ED Course.    Details: EKG normal sinus no acute findings.  Risk Risk Details: Patient complains of a headache.  CT head is normal EKG shows no acute findings troponin is negative.        Final diagnoses:  Atypical chest pain  Nonintractable headache, unspecified chronicity pattern, unspecified headache type    ED Discharge Orders     None      An After Visit Summary was printed and given to the patient.     Nuri Larmer K, PA-C 12/05/23 2114    Towana Ozell BROCKS, MD 12/06/23 [phone]

## 2023-12-05 NOTE — ED Triage Notes (Signed)
 Pt comes in for CP, h/a and nausea. Pt is a type 1 diabetic. Pt replaced her insulin  pump around lunch time today. Pt's h/a is in the left frontal lobe. CP is in the center of the chest. Pt just feels off. A&Ox4.   8/10 is the h/a  7/10 CP; hurts to inhale

## 2023-12-05 NOTE — Discharge Instructions (Signed)
 Return if any problems.

## 2023-12-11 ENCOUNTER — Inpatient Hospital Stay (HOSPITAL_COMMUNITY)
Admission: EM | Admit: 2023-12-11 | Discharge: 2023-12-13 | DRG: 639 | Disposition: A | Discharge status: Home / Self Care | Attending: Internal Medicine | Admitting: Internal Medicine

## 2023-12-11 ENCOUNTER — Emergency Department (HOSPITAL_COMMUNITY)

## 2023-12-11 DIAGNOSIS — Z833 Family history of diabetes mellitus: Secondary | ICD-10-CM | POA: Diagnosis not present

## 2023-12-11 DIAGNOSIS — F419 Anxiety disorder, unspecified: Secondary | ICD-10-CM | POA: Diagnosis present

## 2023-12-11 DIAGNOSIS — E0810 Diabetes mellitus due to underlying condition with ketoacidosis without coma: Secondary | ICD-10-CM

## 2023-12-11 DIAGNOSIS — E1010 Type 1 diabetes mellitus with ketoacidosis without coma: Principal | ICD-10-CM | POA: Diagnosis present

## 2023-12-11 DIAGNOSIS — E109 Type 1 diabetes mellitus without complications: Secondary | ICD-10-CM | POA: Diagnosis not present

## 2023-12-11 DIAGNOSIS — Z79890 Hormone replacement therapy (postmenopausal): Secondary | ICD-10-CM

## 2023-12-11 DIAGNOSIS — Z8249 Family history of ischemic heart disease and other diseases of the circulatory system: Secondary | ICD-10-CM

## 2023-12-11 DIAGNOSIS — E1110 Type 2 diabetes mellitus with ketoacidosis without coma: Secondary | ICD-10-CM | POA: Diagnosis present

## 2023-12-11 DIAGNOSIS — Z7982 Long term (current) use of aspirin: Secondary | ICD-10-CM

## 2023-12-11 DIAGNOSIS — E782 Mixed hyperlipidemia: Secondary | ICD-10-CM | POA: Diagnosis not present

## 2023-12-11 DIAGNOSIS — E7800 Pure hypercholesterolemia, unspecified: Secondary | ICD-10-CM | POA: Diagnosis present

## 2023-12-11 DIAGNOSIS — E039 Hypothyroidism, unspecified: Secondary | ICD-10-CM | POA: Diagnosis present

## 2023-12-11 DIAGNOSIS — M797 Fibromyalgia: Secondary | ICD-10-CM | POA: Diagnosis present

## 2023-12-11 DIAGNOSIS — E1065 Type 1 diabetes mellitus with hyperglycemia: Secondary | ICD-10-CM | POA: Diagnosis not present

## 2023-12-11 DIAGNOSIS — Z9641 Presence of insulin pump (external) (internal): Secondary | ICD-10-CM | POA: Diagnosis present

## 2023-12-11 DIAGNOSIS — F1090 Alcohol use, unspecified, uncomplicated: Secondary | ICD-10-CM | POA: Diagnosis not present

## 2023-12-11 DIAGNOSIS — E785 Hyperlipidemia, unspecified: Secondary | ICD-10-CM | POA: Diagnosis not present

## 2023-12-11 LAB — CBC WITH DIFFERENTIAL/PLATELET
Abs Immature Granulocytes: 0.04 K/uL (ref 0.00–0.07)
Basophils Absolute: 0.1 K/uL (ref 0.0–0.1)
Basophils Relative: 1 %
Eosinophils Absolute: 0.1 K/uL (ref 0.0–0.5)
Eosinophils Relative: 1 %
HCT: 40.6 % (ref 36.0–46.0)
Hemoglobin: 13.4 g/dL (ref 12.0–15.0)
Immature Granulocytes: 0 %
Lymphocytes Relative: 15 %
Lymphs Abs: 1.5 K/uL (ref 0.7–4.0)
MCH: 32.8 pg (ref 26.0–34.0)
MCHC: 33 g/dL (ref 30.0–36.0)
MCV: 99.3 fL (ref 80.0–100.0)
Monocytes Absolute: 0.6 K/uL (ref 0.1–1.0)
Monocytes Relative: 6 %
Neutro Abs: 7.9 K/uL — ABNORMAL HIGH (ref 1.7–7.7)
Neutrophils Relative %: 77 %
Platelets: 286 K/uL (ref 150–400)
RBC: 4.09 MIL/uL (ref 3.87–5.11)
RDW: 13.7 % (ref 11.5–15.5)
WBC: 10.1 K/uL (ref 4.0–10.5)
nRBC: 0 % (ref 0.0–0.2)

## 2023-12-11 LAB — COMPREHENSIVE METABOLIC PANEL WITH GFR
ALT: 27 U/L (ref 0–44)
AST: 27 U/L (ref 15–41)
Albumin: 4.5 g/dL (ref 3.5–5.0)
Alkaline Phosphatase: 160 U/L — ABNORMAL HIGH (ref 38–126)
Anion gap: 24 — ABNORMAL HIGH (ref 5–15)
BUN: 21 mg/dL — ABNORMAL HIGH (ref 6–20)
CO2: 17 mmol/L — ABNORMAL LOW (ref 22–32)
Calcium: 9.5 mg/dL (ref 8.9–10.3)
Chloride: 92 mmol/L — ABNORMAL LOW (ref 98–111)
Creatinine, Ser: 0.86 mg/dL (ref 0.44–1.00)
GFR, Estimated: >60 mL/min (ref >60)
Glucose, Bld: 628 mg/dL (ref 70–99)
Potassium: 3.9 mmol/L (ref 3.5–5.1)
Sodium: 133 mmol/L — ABNORMAL LOW (ref 135–145)
Total Bilirubin: 1.1 mg/dL (ref 0.0–1.2)
Total Protein: 7.1 g/dL (ref 6.5–8.1)

## 2023-12-11 LAB — BLOOD GAS, VENOUS
Acid-base deficit: 6.8 mmol/L — ABNORMAL HIGH (ref 0.0–2.0)
Bicarbonate: 17.7 mmol/L — ABNORMAL LOW (ref 20.0–28.0)
Drawn by: 8786
O2 Saturation: 89.9 %
Patient temperature: 36.9
pCO2, Ven: 32 mmHg — ABNORMAL LOW (ref 44–60)
pH, Ven: 7.35 (ref 7.25–7.43)
pO2, Ven: 59 mmHg — ABNORMAL HIGH (ref 32–45)

## 2023-12-11 LAB — I-STAT CHEM 8, ED
BUN: 21 mg/dL — ABNORMAL HIGH (ref 6–20)
Calcium, Ion: 1.17 mmol/L (ref 1.15–1.40)
Chloride: 99 mmol/L (ref 98–111)
Creatinine, Ser: 0.7 mg/dL (ref 0.44–1.00)
Glucose, Bld: 647 mg/dL (ref 70–99)
HCT: 44 % (ref 36.0–46.0)
Hemoglobin: 15 g/dL (ref 12.0–15.0)
Potassium: 3.9 mmol/L (ref 3.5–5.1)
Sodium: 133 mmol/L — ABNORMAL LOW (ref 135–145)
TCO2: 18 mmol/L — ABNORMAL LOW (ref 22–32)

## 2023-12-11 LAB — TROPONIN T, HIGH SENSITIVITY: Troponin T High Sensitivity: <15 ng/L (ref 0–19)

## 2023-12-11 LAB — CBG MONITORING, ED
Glucose-Capillary: 579 mg/dL (ref 70–99)
Glucose-Capillary: 320 mg/dL — ABNORMAL HIGH (ref 70–99)

## 2023-12-11 LAB — LIPASE, BLOOD: Lipase: 12 U/L (ref 11–51)

## 2023-12-11 MED ORDER — LACTATED RINGERS IV SOLN: INTRAVENOUS | Status: DC

## 2023-12-11 MED ORDER — LACTATED RINGERS IV BOLUS
500.0000 mL | Freq: Once | INTRAVENOUS | Status: AC
  Administered 2023-12-11: 500 mL via INTRAVENOUS

## 2023-12-11 MED ORDER — ONDANSETRON HCL 4 MG/2ML IJ SOLN
4.0000 mg | Freq: Once | INTRAMUSCULAR | Status: AC
  Administered 2023-12-11: 4 mg via INTRAVENOUS
  Filled 2023-12-11: qty 2

## 2023-12-11 MED ORDER — ENOXAPARIN SODIUM 30 MG/0.3ML IJ SOSY
30.0000 mg | PREFILLED_SYRINGE | INTRAMUSCULAR | Status: DC
  Filled 2023-12-11: qty 0.3

## 2023-12-11 MED ORDER — INSULIN ASPART 100 UNIT/ML IJ SOLN
10.0000 [IU] | Freq: Once | INTRAMUSCULAR | Status: AC
  Administered 2023-12-11: 10 [IU] via INTRAVENOUS
  Filled 2023-12-11: qty 1

## 2023-12-11 MED ORDER — POTASSIUM CHLORIDE 10 MEQ/100ML IV SOLN
10.0000 meq | INTRAVENOUS | Status: DC
  Administered 2023-12-11: 10 meq via INTRAVENOUS
  Filled 2023-12-11: qty 100

## 2023-12-11 MED ORDER — INSULIN REGULAR(HUMAN) IN NACL 100-0.9 UT/100ML-% IV SOLN
INTRAVENOUS | Status: DC
  Administered 2023-12-12: 1.6 [IU]/h via INTRAVENOUS

## 2023-12-11 MED ORDER — DEXTROSE IN LACTATED RINGERS 5 % IV SOLN: INTRAVENOUS | Status: DC

## 2023-12-11 MED ORDER — CHLORHEXIDINE GLUCONATE CLOTH 2 % EX PADS
6.0000 | MEDICATED_PAD | Freq: Every day | CUTANEOUS | Status: DC
  Administered 2023-12-12 – 2023-12-13 (×2): 6 via TOPICAL

## 2023-12-11 MED ORDER — DEXTROSE 50 % IV SOLN: 0.0000 mL | INTRAVENOUS | Status: DC | PRN

## 2023-12-11 MED ORDER — INSULIN REGULAR(HUMAN) IN NACL 100-0.9 UT/100ML-% IV SOLN
INTRAVENOUS | Status: DC
  Administered 2023-12-11: 9.5 [IU]/h via INTRAVENOUS
  Filled 2023-12-11: qty 100

## 2023-12-11 MED ORDER — LACTATED RINGERS IV BOLUS
1000.0000 mL | Freq: Once | INTRAVENOUS | Status: AC
  Administered 2023-12-11: 1000 mL via INTRAVENOUS

## 2023-12-11 MED ORDER — POTASSIUM CHLORIDE CRYS ER 20 MEQ PO TBCR
40.0000 meq | EXTENDED_RELEASE_TABLET | Freq: Once | ORAL | Status: AC
  Administered 2023-12-11: 40 meq via ORAL
  Filled 2023-12-11: qty 2

## 2023-12-11 MED ORDER — DEXTROSE 50 % IV SOLN
0.0000 mL | INTRAVENOUS | Status: DC | PRN
  Filled 2023-12-11: qty 50

## 2023-12-11 NOTE — H&P (Incomplete)
 History and Physical    PatientKEVA [name] FMW:[id number] DOB: 08-27-1970 DOA: 12/11/2023 DOS: the patient was seen and examined on 12/11/2023 PCP: Pcp, No  Patient coming from: Home  Chief Complaint:  Chief Complaint  Patient presents with   Hyperglycemia   Emesis   HPI: [name] is a 53 y.o. female with medical history significant of DM 1 on insulin  pump, fibromyalgia, migraines, syncope, HLD, hypothyroidismm chest pain, pulmonary nodules on CT, anxiety. She presented to ED  with reports of vomiting and hyperglycemia. Initial lab workup consistent with DKA with glucose in the 600s , anion gap 24 and bicarb 17. She was given 1.5 liters LR and started on insulin  infusion with bolus via endotool and admission requested  Review of Systems: As mentioned in the history of present illness. All other systems reviewed and are negative. Past Medical History:  Diagnosis Date   Diabetes (HCC)    Fibromyalgia    High cholesterol    Thyroid  disease    Past Surgical History:  Procedure Laterality Date   caesarean     Social History:  reports that she has never smoked. She has never used smokeless tobacco. She reports current alcohol use. She reports that she does not use drugs.  No Known Allergies  Family History  Problem Relation Age of Onset   Cancer Paternal Grandfather    Cancer Paternal Grandmother    Cancer Maternal Grandmother    Cancer Maternal Grandfather    Heart attack Father    Thyroid  disease Sister    Fibromyalgia Sister    Diabetes Other     Prior to Admission medications   Medication Sig Start Date End Date Taking? Authorizing Provider  aspirin  EC 81 MG tablet Take 1 tablet (81 mg total) by mouth daily. Swallow whole. 06/30/22  Yes Krishnan, Gokul, MD  diclofenac  (VOLTAREN ) 75 MG EC tablet Take 1 tablet (75 mg total) by mouth daily as needed for moderate pain (pain score 4-6). 09/11/23  Yes Bevely Doffing, FNP  DULoxetine  (CYMBALTA ) 60 MG capsule  Take 1 capsule (60 mg total) by mouth daily. 09/11/23 09/10/24 Yes Bevely Doffing, FNP  HUMULIN N 100 UNIT/ML injection Inject 12 Units into the skin 2 (two) times daily with breakfast and lunch. 08/11/23  Yes [provider]  insulin  lispro (HUMALOG ) 100 UNIT/ML injection Inject 150 Units into the skin 3 days. 10/06/22  Yes [provider]  levothyroxine  (SYNTHROID ) 75 MCG tablet Take 75 mcg by mouth daily.   Yes [provider]  rosuvastatin  (CRESTOR ) 5 MG tablet Take 1 tablet (5 mg total) by mouth daily. 06/29/22  Yes Krishnan, Gokul, MD  topiramate  (TOPAMAX ) 25 MG tablet Take 1 tablet (25 mg total) by mouth 2 (two) times daily. 09/11/23  Yes Bevely Doffing, FNP  Vitamin D , Ergocalciferol , (DRISDOL) 1.25 MG (50000 UNIT) CAPS capsule Take 50,000 Units by mouth every Wednesday. 12/12/19  Yes [provider]  Continuous Glucose Sensor (DEXCOM G7 SENSOR) MISC APPLY ONE SENSOR EVERY 10 DAYS AS DIRECTED.    [provider]  Glucose Blood (ACCU-CHEK GUIDE TEST VI) as directed In Vitro 4/day 04/02/18   [provider]  Insulin  Disposable Pump (OMNIPOD 5 DEXG7G6 PODS GEN 5) MISC Inject 1 Device into the skin as directed. 11/25/23   [provider]  simvastatin (ZOCOR) 10 MG tablet Take 10 mg by mouth daily. Dr. Tommas  01/16/19  [provider]    Physical Exam: Vitals:   12/11/23 2130 12/11/23 2144 12/11/23  2145 12/11/23 2200  BP:   (!) 146/78 (!) 164/87  Pulse: (!) 103  (!) 101 (!) 103  Resp: (!) 21  18 19   Temp:  98.1 F (36.7 C)    TempSrc:  Oral    SpO2: 95%  93% 98%  Weight:      Height:       General - no acute distress, appears stated age Neuro - alert and oriented x 4,, MAEs equally. Normal affect CV - S1S2 no murmur rubs gallops, no peripheral edema Pulm - sats stable on room air, resp non labored. Lungs clear Abdomen - BS x 4 soft, non tender Data Reviewed: CMP     Component Value Date/Time   NA 133 (L) 12/11/2023  2138   NA 141 11/27/2021 1606   K 3.9 12/11/2023 2138   CL 99 12/11/2023 2138   CO2 17 (L) 12/11/2023 2123   GLUCOSE 647 (HH) 12/11/2023 2138   BUN 21 (H) 12/11/2023 2138   BUN 7 11/27/2021 1606   CREATININE 0.70 12/11/2023 2138   CREATININE 0.64 04/19/2014 1335   CALCIUM  9.5 12/11/2023 2123   PROT 7.1 12/11/2023 2123   PROT 6.9 11/27/2021 1606   ALBUMIN 4.5 12/11/2023 2123   ALBUMIN 4.7 11/27/2021 1606   AST 27 12/11/2023 2123   ALT 27 12/11/2023 2123   ALKPHOS 160 (H) 12/11/2023 2123   BILITOT 1.1 12/11/2023 2123   BILITOT 0.4 11/27/2021 1606   EGFR 104 11/27/2021 1606   GFRNONAA >60 12/11/2023 2123   CBC    Component Value Date/Time   WBC 10.1 12/11/2023 2123   RBC 4.09 12/11/2023 2123   HGB 15.0 12/11/2023 2138   HGB 15.5 11/27/2021 1606   HCT 44.0 12/11/2023 2138   HCT 45.7 11/27/2021 1606   PLT 286 12/11/2023 2123   PLT 342 11/27/2021 1606   MCV 99.3 12/11/2023 2123   MCV 90 11/27/2021 1606   MCH 32.8 12/11/2023 2123   MCHC 33.0 12/11/2023 2123   RDW 13.7 12/11/2023 2123   RDW 12.8 11/27/2021 1606   LYMPHSABS 1.5 12/11/2023 2123   LYMPHSABS 2.0 11/27/2021 1606   MONOABS 0.6 12/11/2023 2123   EOSABS 0.1 12/11/2023 2123   EOSABS 0.3 11/27/2021 1606   BASOSABS 0.1 12/11/2023 2123   BASOSABS 0.1 11/27/2021 1606    EKG reviewed by me ST no STE Chest xray without acute cardiopulmonary processes  Assessment and Plan: No notes have been filed under this hospital service. Service: Hospitalist     Advance Care Planning:   Code Status: Prior ***  Consults: ***  Family Communication: ***  Severity of Illness: {Observation/Inpatient:21159}  Author: Erminio Cone, NP 12/11/2023 11:07 PM  For on call review [email or web address].

## 2023-12-11 NOTE — ED Notes (Signed)
 Provider notified of iStat glucose results.

## 2023-12-11 NOTE — ED Notes (Signed)
 This RN confirmed with PA Medford to give the pt the Potassium with the pts Potassium being 3.9 and having taken 40mEq orally. PA Medford confirmed he did want the IV Potassium to be given.

## 2023-12-11 NOTE — ED Notes (Signed)
 This RN was assisting the pt to the bathroom when pt endorsed that she was having chest pains and nauseas. This RN obtained an EKG and gave it to MD Bero to assess. MD Bero endorsed that the EKG had no changes from the first EKG that was completed upon arrival.

## 2023-12-11 NOTE — ED Provider Notes (Signed)
 [place] [facility] DE[facility] AT [facility] ([facility]) Provider Note   CSN: [id number] Arrival date & time: 12/11/23  2100     Patient presents with: Hyperglycemia and Emesis   [name] is a 53 y.o. female.   Patient is a 53 year old female who presents to the emergency department with a chief complaint of nausea, vomiting and elevated blood sugar.  Patient notes that she ran out of her insulin  pump and has been given herself subcutaneous insulin .  She is a type I diabetic.  She notes that she has had some intermittent chest pain as well since her blood sugar has been elevated.  She denies any abdominal pain, diarrhea.  She has had no dysuria or hematuria.  She denies any lightheadedness, dizziness.   Hyperglycemia Associated symptoms: nausea and vomiting   Emesis      Prior to Admission medications   Medication Sig Start Date End Date Taking? Authorizing Provider  aspirin  EC 81 MG tablet Take 1 tablet (81 mg total) by mouth daily. Swallow whole. 06/30/22  Yes Krishnan, Gokul, MD  diclofenac  (VOLTAREN ) 75 MG EC tablet Take 1 tablet (75 mg total) by mouth daily as needed for moderate pain (pain score 4-6). 09/11/23  Yes Bevely Doffing, FNP  DULoxetine  (CYMBALTA ) 60 MG capsule Take 1 capsule (60 mg total) by mouth daily. 09/11/23 09/10/24 Yes Bevely Doffing, FNP  HUMULIN N 100 UNIT/ML injection Inject 12 Units into the skin 2 (two) times daily with breakfast and lunch. 08/11/23  Yes [provider]  insulin  lispro (HUMALOG ) 100 UNIT/ML injection Inject 150 Units into the skin 3 days. 10/06/22  Yes [provider]  levothyroxine  (SYNTHROID ) 75 MCG tablet Take 75 mcg by mouth daily.   Yes [provider]  rosuvastatin  (CRESTOR ) 5 MG tablet Take 1 tablet (5 mg total) by mouth daily. 06/29/22  Yes Krishnan, Gokul, MD  topiramate  (TOPAMAX ) 25 MG tablet Take 1 tablet (25 mg total) by mouth 2 (two) times daily. 09/11/23  Yes Bevely Doffing, FNP  Vitamin D ,  Ergocalciferol , (DRISDOL) 1.25 MG (50000 UNIT) CAPS capsule Take 50,000 Units by mouth every Wednesday. 12/12/19  Yes [provider]  Continuous Glucose Sensor (DEXCOM G7 SENSOR) MISC APPLY ONE SENSOR EVERY 10 DAYS AS DIRECTED.    [provider]  Glucose Blood (ACCU-CHEK GUIDE TEST VI) as directed In Vitro 4/day 04/02/18   [provider]  Insulin  Disposable Pump (OMNIPOD 5 DEXG7G6 PODS GEN 5) MISC Inject 1 Device into the skin as directed. 11/25/23   [provider]  simvastatin (ZOCOR) 10 MG tablet Take 10 mg by mouth daily. Dr. Balan  01/16/19  [provider]    Allergies: Patient has no known allergies.    Review of Systems  Gastrointestinal:  Positive for nausea and vomiting.    Updated Vital Signs Pulse (!) 103   Temp 98.1 F (36.7 C) (Oral)   Resp (!) 21   Ht 5' 2 (1.575 m)   Wt 65.8 kg   SpO2 95%   BMI 26.52 kg/m   Physical Exam Vitals and nursing note reviewed.  Constitutional:      General: She is not in acute distress.    Appearance: Normal appearance. She is not ill-appearing.  HENT:     Head: Normocephalic and atraumatic.     Nose: Nose normal.     Mouth/Throat:     Mouth: Mucous membranes are moist.  Eyes:     Extraocular Movements: Extraocular movements intact.  Conjunctiva/sclera: Conjunctivae normal.     Pupils: Pupils are equal, round, and reactive to light.  Cardiovascular:     Rate and Rhythm: Normal rate and regular rhythm.     Pulses: Normal pulses.     Heart sounds: Normal heart sounds. No murmur heard.    No gallop.  Pulmonary:     Effort: Pulmonary effort is normal. No respiratory distress.     Breath sounds: Normal breath sounds. No stridor. No wheezing, rhonchi or rales.  Abdominal:     General: Abdomen is flat. Bowel sounds are normal. There is no distension.     Palpations: Abdomen is soft.     Tenderness: There is no abdominal tenderness. There is no guarding.  Musculoskeletal:         General: Normal range of motion.     Cervical back: Normal range of motion and neck supple. No rigidity or tenderness.  Skin:    General: Skin is warm and dry.  Neurological:     General: No focal deficit present.     Mental Status: She is alert and oriented to person, place, and time. Mental status is at baseline.     Cranial Nerves: No cranial nerve deficit.     Sensory: No sensory deficit.     Motor: No weakness.     Coordination: Coordination normal.     Gait: Gait normal.  Psychiatric:        Mood and Affect: Mood normal.        Behavior: Behavior normal.        Thought Content: Thought content normal.        Judgment: Judgment normal.     (all labs ordered are listed, but only abnormal results are displayed) Labs Reviewed  CBG MONITORING, ED - Abnormal; Notable for the following components:      Result Value   Glucose-Capillary 579 (*)    All other components within normal limits  I-STAT CHEM 8, ED - Abnormal; Notable for the following components:   Sodium 133 (*)    BUN 21 (*)    Glucose, Bld 647 (*)    TCO2 18 (*)    All other components within normal limits  COMPREHENSIVE METABOLIC PANEL WITH GFR  CBC WITH DIFFERENTIAL/PLATELET  LIPASE, BLOOD  BLOOD GAS, VENOUS  BETA-HYDROXYBUTYRIC ACID  TROPONIN T, HIGH SENSITIVITY    EKG: None  Radiology: No results found.   Procedures   Medications Ordered in the ED  lactated ringers  bolus 1,000 mL (has no administration in time range)  ondansetron  (ZOFRAN ) injection 4 mg (has no administration in time range)                                    Medical Decision Making Amount and/or Complexity of Data Reviewed Labs: ordered. Radiology: ordered.  Risk Prescription drug management. Decision regarding hospitalization.   This patient presents to the ED for concern of nausea, vomiting, hyperglycemia, this involves an extensive number of treatment options, and is a complaint that carries with it a high risk of  complications and morbidity.  The differential diagnosis includes DKA, HHS, electrolyte derangement, acute kidney injury, dehydration   Co morbidities that complicate the patient evaluation  Type 1 diabetes   Additional history obtained:  Additional history obtained from none External records from outside source obtained and reviewed including none   Lab Tests:  I Ordered, and personally interpreted labs.  The pertinent results include: No leukocytosis, no anemia, normal kidney function, normal liver function, unremarkable electrolytes, hyperglycemia, low bicarb, elevated anion gap normal lipase, negative troponin   Imaging Studies ordered:  I ordered imaging studies including chest x-ray I independently visualized and interpreted imaging which showed no acute cardiopulmonary process I agree with the radiologist interpretation   Cardiac Monitoring: / EKG:  The patient was maintained on a cardiac monitor.  I personally viewed and interpreted the cardiac monitored which showed an underlying rhythm of: Sinus tachycardia, no ST/T wave changes, no ischemic changes, no STEMI   Consultations Obtained:  I requested consultation with the hospitalist,  and discussed lab and imaging findings as well as pertinent plan - they recommend: Admission   Problem List / ED Course / Critical interventions / Medication management  Patient is doing well at this time and does remain stable.  Discussed with patient we will plan for admission to hospital service given the fact that she appears to be in DKA at this point.  She has been given IV fluids, Zofran , insulin  bolus and is currently on a drip.  Patient has no other source of underlying infection at this time.  Do suspect that her DKA is secondary to the fact that she ran out of insulin  for her pump.  Have discussed patient case with hospitalist who has excepted for admission at this time. I ordered medication including IV fluids, insulin ,  potassium, Zofran  for DKA Reevaluation of the patient after these medicines showed that the patient improved I have reviewed the patients home medicines and have made adjustments as needed   Social Determinants of Health:  None   Test / Admission - Considered:  Admission     Final diagnoses:  None    ED Discharge Orders     None          Brenae Lasecki D, PA-C 12/11/23 2306    [name] Lot, MD 12/17/23 [phone]

## 2023-12-11 NOTE — ED Triage Notes (Signed)
 Pt comes in for hyperglycemia. Pt's glucometer cannot read number since it is so high. Pt started to feel nauseated 2 hrs and threw up 3 times in the last 30 mins. Pt is A&Ox4. Pt is type 1 diabetic.

## 2023-12-12 DIAGNOSIS — E109 Type 1 diabetes mellitus without complications: Secondary | ICD-10-CM

## 2023-12-12 DIAGNOSIS — E785 Hyperlipidemia, unspecified: Secondary | ICD-10-CM

## 2023-12-12 LAB — BASIC METABOLIC PANEL WITH GFR
Anion gap: 8 (ref 5–15)
Anion gap: 7 (ref 5–15)
Anion gap: 10 (ref 5–15)
BUN: 12 mg/dL (ref 6–20)
BUN: 8 mg/dL (ref 6–20)
BUN: 7 mg/dL (ref 6–20)
CO2: 27 mmol/L (ref 22–32)
CO2: 28 mmol/L (ref 22–32)
CO2: 27 mmol/L (ref 22–32)
Calcium: 9 mg/dL (ref 8.9–10.3)
Calcium: 9.1 mg/dL (ref 8.9–10.3)
Calcium: 9.4 mg/dL (ref 8.9–10.3)
Chloride: 106 mmol/L (ref 98–111)
Chloride: 107 mmol/L (ref 98–111)
Chloride: 104 mmol/L (ref 98–111)
Creatinine, Ser: 0.47 mg/dL (ref 0.44–1.00)
Creatinine, Ser: 0.6 mg/dL (ref 0.44–1.00)
Creatinine, Ser: 0.55 mg/dL (ref 0.44–1.00)
GFR, Estimated: >60 mL/min (ref >60)
GFR, Estimated: >60 mL/min (ref >60)
GFR, Estimated: >60 mL/min (ref >60)
Glucose, Bld: 173 mg/dL — ABNORMAL HIGH (ref 70–99)
Glucose, Bld: 158 mg/dL — ABNORMAL HIGH (ref 70–99)
Glucose, Bld: 115 mg/dL — ABNORMAL HIGH (ref 70–99)
Potassium: 4.2 mmol/L (ref 3.5–5.1)
Potassium: 5 mmol/L (ref 3.5–5.1)
Potassium: 4.4 mmol/L (ref 3.5–5.1)
Sodium: 141 mmol/L (ref 135–145)
Sodium: 142 mmol/L (ref 135–145)
Sodium: 141 mmol/L (ref 135–145)

## 2023-12-12 LAB — HEMOGLOBIN A1C
Hgb A1c MFr Bld: 10.4 % — ABNORMAL HIGH (ref 4.8–5.6)
Mean Plasma Glucose: 251.78 mg/dL

## 2023-12-12 LAB — GLUCOSE, CAPILLARY
Glucose-Capillary: 122 mg/dL — ABNORMAL HIGH (ref 70–99)
Glucose-Capillary: 143 mg/dL — ABNORMAL HIGH (ref 70–99)
Glucose-Capillary: 148 mg/dL — ABNORMAL HIGH (ref 70–99)
Glucose-Capillary: 155 mg/dL — ABNORMAL HIGH (ref 70–99)
Glucose-Capillary: 160 mg/dL — ABNORMAL HIGH (ref 70–99)
Glucose-Capillary: 162 mg/dL — ABNORMAL HIGH (ref 70–99)
Glucose-Capillary: 162 mg/dL — ABNORMAL HIGH (ref 70–99)
Glucose-Capillary: 169 mg/dL — ABNORMAL HIGH (ref 70–99)
Glucose-Capillary: 170 mg/dL — ABNORMAL HIGH (ref 70–99)
Glucose-Capillary: 178 mg/dL — ABNORMAL HIGH (ref 70–99)
Glucose-Capillary: 189 mg/dL — ABNORMAL HIGH (ref 70–99)
Glucose-Capillary: 194 mg/dL — ABNORMAL HIGH (ref 70–99)
Glucose-Capillary: 203 mg/dL — ABNORMAL HIGH (ref 70–99)
Glucose-Capillary: 218 mg/dL — ABNORMAL HIGH (ref 70–99)

## 2023-12-12 LAB — BETA-HYDROXYBUTYRIC ACID
Beta-Hydroxybutyric Acid: 4.41 mmol/L — ABNORMAL HIGH (ref 0.05–0.27)
Beta-Hydroxybutyric Acid: 0.33 mmol/L — ABNORMAL HIGH (ref 0.05–0.27)

## 2023-12-12 MED ORDER — THIAMINE HCL 100 MG/ML IJ SOLN: 100.0000 mg | Freq: Every day | INTRAMUSCULAR | Status: DC

## 2023-12-12 MED ORDER — LORAZEPAM 2 MG/ML IJ SOLN: 1.0000 mg | INTRAMUSCULAR | Status: DC | PRN

## 2023-12-12 MED ORDER — DULOXETINE HCL 60 MG PO CPEP
60.0000 mg | ORAL_CAPSULE | Freq: Every day | ORAL | Status: DC
  Administered 2023-12-12 (×2): 60 mg via ORAL
  Filled 2023-12-12: qty 1
  Filled 2023-12-12: qty 2

## 2023-12-12 MED ORDER — ACETAMINOPHEN 500 MG PO TABS: 1000.0000 mg | ORAL_TABLET | Freq: Four times a day (QID) | ORAL | Status: DC | PRN

## 2023-12-12 MED ORDER — FOLIC ACID 1 MG PO TABS
1.0000 mg | ORAL_TABLET | Freq: Every day | ORAL | Status: DC
  Administered 2023-12-12 – 2023-12-13 (×2): 1 mg via ORAL
  Filled 2023-12-12 (×2): qty 1

## 2023-12-12 MED ORDER — IBUPROFEN 400 MG PO TABS: 400.0000 mg | ORAL_TABLET | Freq: Four times a day (QID) | ORAL | Status: DC | PRN

## 2023-12-12 MED ORDER — OMNIPOD 5 DEXG7G6 PODS GEN 5 MISC: 1.0000 | 12 refills | Status: AC

## 2023-12-12 MED ORDER — ORAL CARE MOUTH RINSE: 15.0000 mL | OROMUCOSAL | Status: DC | PRN

## 2023-12-12 MED ORDER — TOPIRAMATE 25 MG PO TABS
25.0000 mg | ORAL_TABLET | Freq: Two times a day (BID) | ORAL | Status: DC
  Administered 2023-12-12 – 2023-12-13 (×4): 25 mg via ORAL
  Filled 2023-12-12 (×4): qty 1

## 2023-12-12 MED ORDER — PANTOPRAZOLE SODIUM 40 MG PO TBEC
40.0000 mg | DELAYED_RELEASE_TABLET | Freq: Every day | ORAL | Status: DC
  Administered 2023-12-12 – 2023-12-13 (×2): 40 mg via ORAL
  Filled 2023-12-12 (×2): qty 1

## 2023-12-12 MED ORDER — ASPIRIN 81 MG PO TBEC
81.0000 mg | DELAYED_RELEASE_TABLET | Freq: Every day | ORAL | Status: DC
  Administered 2023-12-12 – 2023-12-13 (×2): 81 mg via ORAL
  Filled 2023-12-12 (×2): qty 1

## 2023-12-12 MED ORDER — THIAMINE MONONITRATE 100 MG PO TABS
100.0000 mg | ORAL_TABLET | Freq: Every day | ORAL | Status: DC
  Administered 2023-12-12: 100 mg via ORAL
  Filled 2023-12-12: qty 1

## 2023-12-12 MED ORDER — LORAZEPAM 1 MG PO TABS: 1.0000 mg | ORAL_TABLET | ORAL | Status: DC | PRN

## 2023-12-12 MED ORDER — ADULT MULTIVITAMIN W/MINERALS CH
1.0000 | ORAL_TABLET | Freq: Every day | ORAL | Status: DC
  Administered 2023-12-12 – 2023-12-13 (×2): 1 via ORAL
  Filled 2023-12-12 (×2): qty 1

## 2023-12-12 MED ORDER — PANTOPRAZOLE SODIUM 40 MG PO TBEC: 40.0000 mg | DELAYED_RELEASE_TABLET | Freq: Every day | ORAL | Status: DC

## 2023-12-12 MED ORDER — LEVOTHYROXINE SODIUM 25 MCG PO TABS
75.0000 ug | ORAL_TABLET | Freq: Every day | ORAL | Status: DC
  Administered 2023-12-12 – 2023-12-13 (×2): 75 ug via ORAL
  Filled 2023-12-12 (×2): qty 3

## 2023-12-12 MED ORDER — INSULIN ASPART 100 UNIT/ML IJ SOLN
0.0000 [IU] | Freq: Three times a day (TID) | INTRAMUSCULAR | Status: DC
  Administered 2023-12-12: 2 [IU] via SUBCUTANEOUS
  Administered 2023-12-12: 3 [IU] via SUBCUTANEOUS
  Administered 2023-12-13: 11 [IU] via SUBCUTANEOUS
  Administered 2023-12-13: 8 [IU] via SUBCUTANEOUS
  Filled 2023-12-12 (×4): qty 1

## 2023-12-12 MED ORDER — INSULIN GLARGINE-YFGN 100 UNIT/ML ~~LOC~~ SOLN
10.0000 [IU] | Freq: Two times a day (BID) | SUBCUTANEOUS | Status: DC
  Administered 2023-12-12 (×2): 10 [IU] via SUBCUTANEOUS
  Filled 2023-12-12 (×5): qty 0.1

## 2023-12-12 MED ORDER — ENOXAPARIN SODIUM 40 MG/0.4ML IJ SOSY
40.0000 mg | PREFILLED_SYRINGE | INTRAMUSCULAR | Status: DC
  Filled 2023-12-12: qty 0.4

## 2023-12-12 MED ORDER — ROSUVASTATIN CALCIUM 10 MG PO TABS
5.0000 mg | ORAL_TABLET | Freq: Every day | ORAL | Status: DC
  Administered 2023-12-12: 5 mg via ORAL
  Filled 2023-12-12: qty 1

## 2023-12-12 MED ORDER — PANTOPRAZOLE SODIUM 40 MG IV SOLR
40.0000 mg | Freq: Every day | INTRAVENOUS | Status: DC
  Administered 2023-12-12: 40 mg via INTRAVENOUS
  Filled 2023-12-12: qty 10

## 2023-12-12 NOTE — Progress Notes (Addendum)
  Progress Note   PatientSHYRA [name] FMW:[id number] DOB: 25-Aug-1970 DOA: 12/11/2023     1 DOS: the patient was seen and examined on 12/12/2023   Brief hospital course: Mrs. [name] was admitted to the hospital with the working diagnosis of diabetic ketoacidosis.   53 yo female with the past medical history of T1DM, hypothyroidism, hyperlipidemia, and fibromyalgia, who presented with nausea, and vomiting. Apparently she run out of her insulin  disposable pump, she transitioned to sq insulin , unfortunately despite this she developed worsening hyperglycemia. On her initial physical examination her blood pressure was 146/78, HR 103 RR 21 and 02 saturation 93%, lungs with no wheezing or rhonchi, heart with S1 and S2 present and regular, abdomen with no distention and no lower extremity edema.   VBG 7,35/ 32/ 59/ 17/ 89%  Na 133, K 3.9 Cl 92 bicarbonate 17 glucose 628, BUN 21 cr 0,86,  Anion gap 24  AST 12 ALT 27  Wbc 10,1 hgb 13.4 plt 286   Chest radiograph with hypoinflation, with no effusions, infiltrates or cardiomegaly.   EKG 101 bpm, normal axis, normal intervals, qtc 462, sinus rhythm with no significant ST segment or T wave changes.   Patient was placed on IV fluids and IV insulin  with good toleration.   Assessment and Plan: * DKA (diabetic ketoacidosis) (HCC) Anion gap has closed.  Transition to clears and if good toleration will advance to regular diabetic diet.   Patient has been using about 9.6 units of insulin  over the last 8 hrs.  Insulin  needs calculated about 28 units in 24 hrs, close to her insulin  pump regular dose of about 33 units per day.  Will plan to transition to sq insulin  10 units bid of long acting and insulin  sliding scale. Her disposable pump has been refilled, if patient stable, will plan to resume insulin  pump within the next 24 hrs.   Follow up BMP this pm.   Type 1 diabetes mellitus without complications [facility]) Patient will resume her insulin   pump, when available.  Continue sq insulin  for now.   Fibromyalgia affecting multiple sites Continue supportive medical therapy and follow up as outpatient  Continue topiramate , and duloxetine .  ibuprofen   Discontinue lorazepam  CIWA, no signs of alcohol withdrawal.   Hyperlipidemia Continue with statin therapy   Hypothyroidism Continue levothyroxine          Subjective: Patient with no chest pain and no dyspnea, no nausea or vomiting, she has been nothing by mouth since admission   Physical Exam: Vitals:   12/12/23 0400 12/12/23 0500 12/12/23 0600 12/12/23 0700  BP:    (!) 142/84  Pulse: 81 83 96 99  Resp: 18 11 16 16   Temp:      TempSrc:      SpO2: 95% 98% 93% 90%  Weight:      Height:       Neurology awake and alert ENT with mild pallor Cardiovascular with S1 and S2 present and regular with no gallops, rubs or murmurs Respiratory with no rales or wheezing, no rhonchi  Abdomen is soft and not distended, not tender No lower extremity edema   Data Reviewed:    Family Communication: no family at the bedside   Disposition: Status is: Inpatient Remains inpatient appropriate because: recovering DKA  Planned Discharge Destination: Home     Author: Elidia Toribio Furnace, MD 12/12/2023 8:41 AM  For on call review [email or web address].

## 2023-12-12 NOTE — Plan of Care (Signed)

## 2023-12-12 NOTE — TOC CM/SW Note (Signed)
 Transition of Care [facility]) - Inpatient Brief Assessment   Patient Details  Name: [name] MRN: [id number] Date of Birth: [birth year]  Transition of Care [facility]) CM/SW Contact:    Lucie Lunger, LCSWA Phone Number: 12/12/2023, 8:38 AM   Clinical Narrative: [facility] consulted for SA resources. CSW added resources to AVS for pt to review at D/C. CSW also noted pt has no PCP listed. CSW added PCP list for pt to review at D/C.   Transition of Care Department [facility] has reviewed patient and no TOC needs have been identified at this time. We will continue to monitor patient advancement through interdiciplinary progression rounds. If new patient transition needs arise, please place a TOC consult.  Transition of Care Asessment: Insurance and Status: Insurance coverage has been reviewed Patient has primary care physician: No (PCP list added to AVS) Home environment has been reviewed: From home Prior level of function:: Independent Prior/Current Home Services: No current home services Social Drivers of Health Review: SDOH reviewed no interventions necessary Readmission risk has been reviewed: Yes Transition of care needs: no transition of care needs at this time

## 2023-12-12 NOTE — Assessment & Plan Note (Signed)
 Continue levothyroxine 

## 2023-12-12 NOTE — Assessment & Plan Note (Signed)
 Uncontrolled hyperglycemia with Hgb A1c at 10.4   Patient will resume her insulin  pump.  I have requested 12 refills.  Continue life style modifications.

## 2023-12-12 NOTE — Discharge Instructions (Signed)

## 2023-12-12 NOTE — Inpatient Diabetes Management (Addendum)
 Inpatient Diabetes Program Recommendations  AACE/ADA: New Consensus Statement on Inpatient Glycemic Control (2015)  Target Ranges:  Prepandial:   less than 140 mg/dL      Peak postprandial:   less than 180 mg/dL (1-2 hours)      Critically ill patients:  140 - 180 mg/dL   Lab Results  Component Value Date   GLUCAP 178 (H) 12/12/2023   HGBA1C 10.4 (H) 12/12/2023    Review of Glycemic Control  Latest Reference Range & Units 12/12/23 08:21 12/12/23 09:26  Glucose-Capillary 70 - 99 mg/dL 844 (H) 821 (H)   Diabetes history: DM 1 Outpatient Diabetes medications:  Omnipod- see's Dr. Tommas Current orders for Inpatient glycemic control:  Novolog  0-15 units tid with meals Semglee  10 units bid  Inpatient Diabetes Program Recommendations:    Please reduce Novolog  correction to sensitive (0-9 units) and add Novolog  3 units tid with meals.  Spoke to patient by phone.  Patient ran out of pods for insulin  pump.  She states that new Rx. Has been called in, however they will not be available until Tuesday 12/15/23. She has been using NPH 12 units bid plus Humalog  for managing CBG's but state that they just got out of control. We discussed use of Lantus  daily instead of NPH for basal insulin .  Also patient will use her insulin  pump calculator to determine insulin  doses prior to meals.  She feels comfortable doing this.  I reminded her that she needs to wait atleast 20-24 hours after basal insulin  before starting insulin  pump.  Also encouraged her to order more CGM's/Dexcom.  She states she now has Medicaid and this should help.   Thanks,  Randall Bullocks, RN, BC-ADM Inpatient Diabetes Coordinator Pager [phone]

## 2023-12-12 NOTE — ED Notes (Signed)
 This RN could not get the blood glucose monitor results to cross over to the pts chart. This RN and several staffed try to remedy the situation. This RN and Ellouise RN witnessed the blood glucose of 169 on this pt taken at 2344.

## 2023-12-12 NOTE — Assessment & Plan Note (Addendum)
 Continue supportive medical therapy and follow up as outpatient  Continue topiramate , and duloxetine .  ibuprofen   Discontinue lorazepam  CIWA, no signs of alcohol withdrawal.

## 2023-12-12 NOTE — Hospital Course (Addendum)
 Mrs. [name] was admitted to the hospital with the working diagnosis of diabetic ketoacidosis.   53 yo female with the past medical history of T1DM, hypothyroidism, hyperlipidemia, and fibromyalgia, who presented with nausea, and vomiting. Apparently she run out of her insulin  disposable pump, she transitioned to sq insulin , unfortunately despite this she developed worsening hyperglycemia. On her initial physical examination her blood pressure was 146/78, HR 103 RR 21 and 02 saturation 93%, lungs with no wheezing or rhonchi, heart with S1 and S2 present and regular, abdomen with no distention and no lower extremity edema.   VBG 7,35/ 32/ 59/ 17/ 89%  Na 133, K 3.9 Cl 92 bicarbonate 17 glucose 628, BUN 21 cr 0,86,  Anion gap 24  AST 12 ALT 27  Wbc 10,1 hgb 13.4 plt 286   Chest radiograph with hypoinflation, with no effusions, infiltrates or cardiomegaly.   EKG 101 bpm, normal axis, normal intervals, qtc 462, sinus rhythm with no significant ST segment or T wave changes.   Patient was placed on IV fluids and IV insulin  with good toleration.

## 2023-12-12 NOTE — Assessment & Plan Note (Addendum)
 Continue with statin therapy.  ?

## 2023-12-12 NOTE — Assessment & Plan Note (Addendum)
 Patient was placed on IV insulin  and IV fluids.  Anion gap closes and she was transitioned to sq insulin  with good toleration.  Diet was advanced with no further nausea, vomiting or abdominal pain.   At the time of her discharge her metabolic panel shows a anion gap of 10 and glucose of 301 mg/dl   Her disposable pump has been refilled, and she will resume using it tonight, 24 hrs after the last dose of basal insulin .  Follow up as outpatient.

## 2023-12-13 DIAGNOSIS — E1065 Type 1 diabetes mellitus with hyperglycemia: Secondary | ICD-10-CM

## 2023-12-13 LAB — BASIC METABOLIC PANEL WITH GFR
Anion gap: 10 (ref 5–15)
BUN: 7 mg/dL (ref 6–20)
CO2: 25 mmol/L (ref 22–32)
Calcium: 9.1 mg/dL (ref 8.9–10.3)
Chloride: 103 mmol/L (ref 98–111)
Creatinine, Ser: 0.55 mg/dL (ref 0.44–1.00)
GFR, Estimated: >60 mL/min (ref >60)
Glucose, Bld: 301 mg/dL — ABNORMAL HIGH (ref 70–99)
Potassium: 4.7 mmol/L (ref 3.5–5.1)
Sodium: 138 mmol/L (ref 135–145)

## 2023-12-13 LAB — HIV ANTIBODY (ROUTINE TESTING W REFLEX): HIV Screen 4th Generation wRfx: NONREACTIVE

## 2023-12-13 LAB — GLUCOSE, CAPILLARY
Glucose-Capillary: 283 mg/dL — ABNORMAL HIGH (ref 70–99)
Glucose-Capillary: 254 mg/dL — ABNORMAL HIGH (ref 70–99)
Glucose-Capillary: 328 mg/dL — ABNORMAL HIGH (ref 70–99)

## 2023-12-13 LAB — MRSA NEXT GEN BY PCR, NASAL: MRSA by PCR Next Gen: NOT DETECTED

## 2023-12-13 MED ORDER — INSULIN LISPRO 100 UNIT/ML IJ SOLN: INTRAMUSCULAR | 11 refills | Status: AC

## 2023-12-13 NOTE — Inpatient Diabetes Management (Incomplete)
 Inpatient Diabetes Program Recommendations  AACE/ADA: New Consensus Statement on Inpatient Glycemic Control (2015)  Target Ranges:  Prepandial:   less than 140 mg/dL      Peak postprandial:   less than 180 mg/dL (1-2 hours)      Critically ill patients:  140 - 180 mg/dL   Lab Results  Component Value Date   GLUCAP 283 (H) 12/13/2023   HGBA1C 10.4 (H) 12/12/2023    Review of Glycemic Control  Latest Reference Range & Units 12/12/23 20:45 12/13/23 07:39  Glucose-Capillary 70 - 99 mg/dL 781 (H) 716 (H)  Diabetes history: DM 1 Outpatient Diabetes medications:  Omnipod- see's Dr. Tommas Current orders for Inpatient glycemic control:  Novolog  0-15 units tid with meals Semglee  10 units bid  Inpatient Diabetes Program Recommendations:    Consider slight increase of Semglee  to 11 units bid.  Also consider reducing Novolog  correction to sensitive (0-9 units) tid with meals and add Novolog  meal coverage 2-3 units tid with meals.   Will need prescription for Lantus  solostar at discharge for basal and patient can use Humalog  for meal coverage using pump calculator until her pods are available.   Thanks,  Randall Bullocks, RN, BC-ADM Inpatient Diabetes Coordinator Pager [phone]  (8a-5p)

## 2023-12-13 NOTE — Discharge Summary (Signed)
 Physician Discharge Summary   Patient: [name] MRN: [id number] DOB: Jan 26, 1970  Admit date:     12/11/2023  Discharge date: 12/13/23  Discharge Physician: Elidia Sieving Shawnee Gambone   PCP: Pcp, No   Recommendations at discharge:    Patient will resume insulin  per insulin  pump. (12 refills called to her pharmacy)  Will need close follow up as outpatient for her uncontrolled T1DM. Follow up with primary care in 7 to 10 days.   Discharge Diagnoses: Principal Problem:   DKA (diabetic ketoacidosis) (HCC) Active Problems:   Type 1 diabetes mellitus without complications (HCC)   Fibromyalgia affecting multiple sites   Hyperlipidemia   Hypothyroidism  Resolved Problems:   * No resolved hospital problems. [facility] Course: [name] was admitted to the hospital with the working diagnosis of diabetic ketoacidosis.   53 yo female with the past medical history of T1DM, hypothyroidism, hyperlipidemia, and fibromyalgia, who presented with nausea, and vomiting. Apparently she run out of her insulin  disposable pump, she transitioned to sq insulin , but unfortunately despite this she developed worsening hyperglycemia. On her initial physical examination her blood pressure was 146/78, HR 103 RR 21 and 02 saturation 93%, lungs with no wheezing or rhonchi, heart with S1 and S2 present and regular, abdomen with no distention and no lower extremity edema.   VBG 7,35/ 32/ 59/ 17/ 89%  Na 133, K 3.9 Cl 92 bicarbonate 17 glucose 628, BUN 21 cr 0,86,  Anion gap 24  AST 12 ALT 27  Wbc 10,1 hgb 13.4 plt 286   Chest radiograph with hypoinflation, with no effusions, infiltrates or cardiomegaly.   EKG 101 bpm, normal axis, normal intervals, qtc 462, sinus rhythm with no significant ST segment or T wave changes.   Patient was placed on IV fluids and IV insulin  with good toleration.  She was successfully transitioned to sq insulin  with good toleration.  Plan to follow up as outpatient.    Assessment and Plan: * DKA (diabetic ketoacidosis) (HCC) Patient was placed on IV insulin  and IV fluids.  Anion gap closes and she was transitioned to sq insulin  with good toleration.  Diet was advanced with no further nausea, vomiting or abdominal pain.   At the time of her discharge her metabolic panel shows a anion gap of 10 and glucose of 301 mg/dl   Her disposable pump has been refilled, and she will resume using it tonight, 24 hrs after the last dose of basal insulin .  Follow up as outpatient.   Type 1 diabetes mellitus with hyperglycemia, with long-term current use of insulin  (HCC) Uncontrolled hyperglycemia with Hgb A1c at 10.4   Patient will resume her insulin  pump.  I have requested 12 refills.  Continue life style modifications.    Fibromyalgia affecting multiple sites Continue supportive medical therapy and follow up as outpatient  Continue topiramate , and duloxetine .  Ibuprofen .   Hyperlipidemia Continue with statin therapy   Hypothyroidism Continue levothyroxine          Consultants: none  Procedures performed: none   Disposition: Home Diet recommendation:  Cardiac and Carb modified diet DISCHARGE MEDICATION: Allergies as of 12/13/2023   No Known Allergies      Medication List     STOP taking these medications    HumuLIN N 100 UNIT/ML injection Generic drug: insulin  NPH Human       TAKE these medications    ACCU-CHEK GUIDE TEST VI as directed In Vitro 4/day   aspirin  EC 81 MG tablet Take 1  tablet (81 mg total) by mouth daily. Swallow whole.   Dexcom G7 Sensor Misc APPLY ONE SENSOR EVERY 10 DAYS AS DIRECTED.   diclofenac  75 MG EC tablet Commonly known as: VOLTAREN  Take 1 tablet (75 mg total) by mouth daily as needed for moderate pain (pain score 4-6).   DULoxetine  60 MG capsule Commonly known as: CYMBALTA  Take 1 capsule (60 mg total) by mouth daily.   insulin  lispro 100 UNIT/ML injection Commonly known as: HUMALOG  Insulin   pump   levothyroxine  75 MCG tablet Commonly known as: SYNTHROID  Take 75 mcg by mouth daily.   Omnipod 5 DexG7G6 Pods Gen 5 Misc Inject 1 Device into the skin as directed.   rosuvastatin  5 MG tablet Commonly known as: CRESTOR  Take 1 tablet (5 mg total) by mouth daily.   topiramate  25 MG tablet Commonly known as: TOPAMAX  Take 1 tablet (25 mg total) by mouth 2 (two) times daily.        Discharge Exam: Filed Weights   12/11/23 2109 12/12/23 0008 12/12/23 1528  Weight: 65.8 kg 68.5 kg 68.8 kg   BP (!) 141/86   Pulse 84   Temp 98.5 F (36.9 C) (Oral)   Resp 16   Ht 5' 2 (1.575 m)   Wt 68.8 kg   SpO2 97%   BMI 27.74 kg/m   Patient with no chest pain and no dyspnea, no nausea or vomiting, tolerating po well, no abdominal pain.  Neurology awake and alert ENT with no pallor Cardiovascular with S1 and S2 present and regular with no gallops, rubs or murmurs Respiratory with no rales or wheezing Abdomen with no distention  No lower extremity edema   Condition at discharge: stable  The results of significant diagnostics from this hospitalization (including imaging, microbiology, ancillary and laboratory) are listed below for reference.   Imaging Studies: DG Chest Port 1 View Result Date: 12/11/2023 EXAM: 1 VIEW(S) XRAY OF THE CHEST 12/11/2023 09:58:02 PM COMPARISON: None available. CLINICAL HISTORY: CP CP FINDINGS: LUNGS AND PLEURA: No focal pulmonary opacity. No pleural effusion. No pneumothorax. HEART AND MEDIASTINUM: No acute abnormality of the cardiac and mediastinal silhouettes. BONES AND SOFT TISSUES: No acute osseous abnormality. IMPRESSION: 1. No acute process. Electronically signed by: Franky Crease MD 12/11/2023 10:03 PM EST RP Workstation: [id number]   CT Head Wo Contrast Result Date: 12/05/2023 CLINICAL DATA:  Left frontal headache. EXAM: CT HEAD WITHOUT CONTRAST TECHNIQUE: Contiguous axial images were obtained from the base of the skull through the vertex  without intravenous contrast. RADIATION DOSE REDUCTION: This exam was performed according to the departmental dose-optimization program which includes automated exposure control, adjustment of the mA and/or kV according to patient size and/or use of iterative reconstruction technique. COMPARISON:  September 02, 2023 FINDINGS: Brain: No evidence of acute infarction, hemorrhage, hydrocephalus, extra-axial collection or mass lesion/mass effect. Stable, symmetric bilateral thalamic mineralization is noted. Vascular: No hyperdense vessel or unexpected calcification. Skull: Normal. Negative for fracture or focal lesion. Sinuses/Orbits: Postoperative changes are seen involving the right lens. Other: None. IMPRESSION: No acute intracranial abnormality. Electronically Signed   By: Suzen Dials M.D.   On: 12/05/2023 18:48    Microbiology: Results for orders placed or performed during the hospital encounter of 12/29/21  Culture, group A strep     Status: None   Collection Time: 12/29/21  8:37 AM   Specimen: Throat  Result Value Ref Range Status   Specimen Description   Final    THROAT Performed at [facility], [address]  [place], [place] [address]    Special Requests   Final    NONE Performed at [facility], [address], [place], [place] [address]    Culture   Final    NO GROUP A STREP (S.PYOGENES) ISOLATED Performed at [facility], [address] [place], [place] [address]    Report Status 01/01/2022 FINAL  Final  Resp Panel by RT-PCR (Flu A&B, Covid) Anterior Nasal Swab     Status: None   Collection Time: 12/29/21  8:38 AM   Specimen: Anterior Nasal Swab  Result Value Ref Range Status   SARS Coronavirus 2 by RT PCR NEGATIVE NEGATIVE Final    Comment: (NOTE) SARS-CoV-2 target nucleic acids are NOT DETECTED.  The SARS-CoV-2 RNA is generally detectable in upper respiratory specimens during the acute phase of infection. The lowest concentration of SARS-CoV-2 viral copies this  assay can detect is 138 copies/mL. A negative result does not preclude SARS-Cov-2 infection and should not be used as the sole basis for treatment or other patient management decisions. A negative result may occur with  improper specimen collection/handling, submission of specimen other than nasopharyngeal swab, presence of viral mutation(s) within the areas targeted by this assay, and inadequate number of viral copies(<138 copies/mL). A negative result must be combined with clinical observations, patient history, and epidemiological information. The expected result is Negative.  Fact Sheet for Patients:  bloggercourse.com  Fact Sheet for Healthcare Providers:  seriousbroker.it  This test is no t yet approved or cleared by the [place]  FDA and  has been authorized for detection and/or diagnosis of SARS-CoV-2 by FDA under an Emergency Use Authorization (EUA). This EUA will remain  in effect (meaning this test can be used) for the duration of the COVID-19 declaration under Section 564(b)(1) of the Act, 21 U.S.C.section 360bbb-3(b)(1), unless the authorization is terminated  or revoked sooner.       Influenza A by PCR NEGATIVE NEGATIVE Final   Influenza B by PCR NEGATIVE NEGATIVE Final    Comment: (NOTE) The Xpert Xpress SARS-CoV-2/FLU/RSV plus assay is intended as an aid in the diagnosis of influenza from Nasopharyngeal swab specimens and should not be used as a sole basis for treatment. Nasal washings and aspirates are unacceptable for Xpert Xpress SARS-CoV-2/FLU/RSV testing.  Fact Sheet for Patients: bloggercourse.com  Fact Sheet for Healthcare Providers: seriousbroker.it  This test is not yet approved or cleared by the [place]  FDA and has been authorized for detection and/or diagnosis of SARS-CoV-2 by FDA under an Emergency Use Authorization (EUA). This EUA will  remain in effect (meaning this test can be used) for the duration of the COVID-19 declaration under Section 564(b)(1) of the Act, 21 U.S.C. section 360bbb-3(b)(1), unless the authorization is terminated or revoked.  Performed at [facility], [address] [place], [place] [address]     Labs: CBC: Recent Labs  Lab 12/11/23 2123 12/11/23 2138  WBC 10.1  --   NEUTROABS 7.9*  --   HGB 13.4 15.0  HCT 40.6 44.0  MCV 99.3  --   PLT 286  --    Basic Metabolic Panel: Recent Labs  Lab 12/11/23 2123 12/11/23 2138 12/12/23 0351 12/12/23 0743 12/12/23 1550 12/13/23 0749  NA 133* 133* 141 142 141 138  K 3.9 3.9 4.2 5.0 4.4 4.7  CL 92* 99 106 107 104 103  CO2 17*  --  27 28 27 25   GLUCOSE 628* 647* 173* 158* 115* 301*  BUN 21* 21* 12  8 7 7   CREATININE 0.86 0.70 0.47 0.60 0.55 0.55  CALCIUM  9.5  --  9.0 9.1 9.4 9.1   Liver Function Tests: Recent Labs  Lab 12/11/23 2123  AST 27  ALT 27  ALKPHOS 160*  BILITOT 1.1  PROT 7.1  ALBUMIN 4.5   CBG: Recent Labs  Lab 12/12/23 1029 12/12/23 1127 12/12/23 1621 12/12/23 2045 12/13/23 0739  GLUCAP 162* 170* 122* 218* 283*    Discharge time spent: greater than 30 minutes.  Signed: Elidia Toribio Furnace, MD [facility] 12/13/2023

## 2023-12-14 LAB — GLUCOSE, CAPILLARY: Glucose-Capillary: 169 mg/dL — ABNORMAL HIGH (ref 70–99)

## 2023-12-25 ENCOUNTER — Ambulatory Visit: Admitting: Cardiology

## 2024-01-01 ENCOUNTER — Ambulatory Visit: Admitting: Cardiology

## 2024-02-10 ENCOUNTER — Ambulatory Visit: Admitting: Physician Assistant

## 2024-02-10 NOTE — Progress Notes (Unsigned)
 " Cardiology Office Note:  .   Date:  02/10/2024  ID:  [name], DOB Oct 14, 1970, MRN [id number] PCP: Freddrick Johns  [facility] Providers Cardiologist:  Alvan Carrier, MD { Click to update primary MD,subspecialty MD or APP then REFRESH:1}   History of Present Illness: .   [name] is a 54 y.o. female  with PMHx of chest pain, HLD, DM2, hypothyroidism who reports to [facility] office for follow up.   Pertinent cardiac medical history:  Echo 06/2022: EF 65 to 70%, no significant valvular disease Cardiac CTA 10/2022: CAC of 0, no evidence of CAD, indeterminant small pulmonary nodules Heart monitor 01/2023: NSR, average HR 99, <1% PACs/PVCs  Last seen in heartcare 01/23/2023 by Laymon Qua, PA-C for 41-month follow-up.  Reported ongoing occasional chest discomfort occurring at rest or exertion.  Also noted 3 episodes of falling since 10/2022 associated with walking with no LOC and no known precipitating events.  Continued on ASA 81 mg daily, Crestor  5 mg daily.  Ordered follow-up heart monitor as above with no significant arrhythmias.  Also ordered noncontrast chest CT in 3 to 6 months to evaluate pulmonary nodules as recommended by CTA report.  Recent hospitalization   Today, reports ### and denies ###.  Denies chest pain, shortness of breath, palpitations, syncope, presyncope, dizziness, orthopnea, PND, swelling or significant weight changes, acute bleeding, or claudication.   Reports compliance with medications.  Dietary habitats:  Activity level:  Social: Denies tobacco use/alcohol/drug use  Denies any recent hospitalizations or visits to the emergency department.   ROS: 10 point review of system has been reviewed and considered negative except ones been listed in the HPI.   Studies Reviewed: SABRA   ECHO 06/2022 IMPRESSIONS   1. Left ventricular ejection fraction, by estimation, is 65 to 70%. The  left ventricle has normal function. The left ventricle has no  regional  wall motion abnormalities. Left ventricular diastolic parameters were  normal.   2. Right ventricular systolic function is normal. The right ventricular  size is normal. Tricuspid regurgitation signal is inadequate for assessing  PA pressure.   3. The mitral valve is grossly normal. Trivial mitral valve  regurgitation. No evidence of mitral stenosis.   4. The aortic valve was not well visualized. Aortic valve regurgitation  is not visualized. No aortic stenosis is present.   5. The inferior vena cava is normal in size with greater than 50%  respiratory variability, suggesting right atrial pressure of 3 mmHg.   Conclusion(s)/Recommendation(s): No intracardiac source of embolism  detected on this transthoracic study. Consider a transesophageal  echocardiogram to exclude cardiac source of embolism if clinically  indicated.   CCTA 10/2022 IMPRESSION: 1. Indeterminate small pulmonary nodules. Largest pulmonary nodule measures 6 mm in left lower lobe. Non-contrast chest CT at 3-6 months is recommended. If the nodules are stable at time of repeat CT, then future CT at 18-24 months (from today's scan) is considered optional for low-risk patients, but is recommended for high-risk patients. This recommendation follows the consensus statement: Guidelines for Management of Incidental Pulmonary Nodules Detected on CT Images: From the Fleischner Society 2017; Radiology 2017; 284:228-243. IMPRESSION: 1. Coronary calcium  score of 0. This was 1st percentile for age, sex, and race matched control.   2. Normal coronary origin with right dominance.   3. CAD-RADS 0. No evidence of CAD (0%). Consider non-atherosclerotic causes of chest pain. Heart Monitor 01/2023   14 day monitor   Rare supraventricular ectopy in the form  of isolated PACs, couplets, triplets.   Rare ventricular ectopy in the form of isolated PVCs, couplets   Patient triggered events but no diary is included to describe  symptoms. Triggered evens correlate with sinus rhythm, sinus tachycardia, rare ectopy.     Patch Wear Time:  13 days and 23 hours (2025-01-03T14:10:55-0500 to 2025-01-17T14:10:47-0500)   Patient had a min HR of 70 bpm, max HR of 164 bpm, and avg HR of 99 bpm. Predominant underlying rhythm was Sinus Rhythm. Isolated SVEs were rare (<1.0%), SVE Couplets were rare (<1.0%), and SVE Triplets were rare (<1.0%). Isolated VEs were rare (<1.0%),  VE Couplets were rare (<1.0%), and no VE Triplets were present.  CV Studies: Cardiac studies reviewed are outlined and summarized above. Otherwise please see EMR for full report.   Risk Assessment/Calculations:   {Does this patient have ATRIAL FIBRILLATION?:[phone]} No BP recorded.  {Refresh Note OR Click here to enter BP  :1}***       Physical Exam:   VS:  There were no vitals taken for this visit.   Wt Readings from Last 3 Encounters:  12/12/23 151 lb 10.8 oz (68.8 kg)  12/05/23 145 lb (65.8 kg)  09/11/23 147 lb 1.3 oz (66.7 kg)    GEN: Well nourished, well developed in no acute distress while sitting in chair.  NECK: No JVD; No carotid bruits CARDIAC: ***RRR, no murmurs, rubs, gallops RESPIRATORY:  Clear to auscultation without rales, wheezing or rhonchi  ABDOMEN: Soft, non-tender, non-distended EXTREMITIES:  No edema; No deformity   ASSESSMENT AND PLAN: .   ***    {Are you ordering a CV Procedure (e.g. stress test, cath, DCCV, TEE, etc)?   Press F2        :[id number]}  Dispo: ***  Signed, Lorette CINDERELLA Kapur, PA-C  "

## 2024-03-14 ENCOUNTER — Ambulatory Visit
